# Patient Record
Sex: Male | Born: 1960 | Race: White | Hispanic: No | Marital: Single
Health system: Midwestern US, Community
[De-identification: ages and names within clinical notes are randomized; demographics above are authoritative.]

## PROBLEM LIST (undated history)

## (undated) DIAGNOSIS — I214 Non-ST elevation (NSTEMI) myocardial infarction: Principal | ICD-10-CM

## (undated) DIAGNOSIS — E785 Hyperlipidemia, unspecified: Secondary | ICD-10-CM

## (undated) DIAGNOSIS — E119 Type 2 diabetes mellitus without complications: Secondary | ICD-10-CM

## (undated) DIAGNOSIS — I10 Essential (primary) hypertension: Secondary | ICD-10-CM

## (undated) HISTORY — DX: Hyperlipidemia, unspecified: E78.5

## (undated) HISTORY — DX: Essential (primary) hypertension: I10

## (undated) HISTORY — DX: Type 2 diabetes mellitus without complications: E11.9

---

## 2009-06-18 ENCOUNTER — Encounter: Admission: RE | Admit: 2009-06-18 | Discharge: 2009-06-18 | Payer: Self-pay | Admitting: Family Medicine

## 2015-11-09 ENCOUNTER — Inpatient Hospital Stay
Admit: 2015-11-09 | Discharge: 2015-11-16 | Disposition: A | Discharge status: Home / Self Care | Payer: BLUE CROSS/BLUE SHIELD | Attending: Internal Medicine | Admitting: Internal Medicine

## 2015-11-09 ENCOUNTER — Emergency Department: Admit: 2015-11-09 | Payer: BLUE CROSS/BLUE SHIELD

## 2015-11-09 DIAGNOSIS — I214 Non-ST elevation (NSTEMI) myocardial infarction: Principal | ICD-10-CM

## 2015-11-09 LAB — URINALYSIS W/ RFLX MICROSCOPIC
Bilirubin: NEGATIVE
Glucose: NEGATIVE mg/dl
Ketone: NEGATIVE mg/dl
Leukocyte Esterase: NEGATIVE
Nitrites: NEGATIVE
Protein: 100 mg/dl — AB
Specific gravity: 1.025 (ref 1.005–1.030)
Urobilinogen: 0.2 mg/dl (ref 0.0–1.0)
pH (UA): 5.5 (ref 5.0–9.0)

## 2015-11-09 LAB — CBC WITH AUTOMATED DIFF
BASOPHILS: 0.4 % (ref 0–3)
EOSINOPHILS: 0.4 % (ref 0–5)
HCT: 48.2 % (ref 37.0–50.0)
HGB: 16.5 gm/dl (ref 12.4–17.2)
IMMATURE GRANULOCYTES: 0.4 % (ref 0.0–3.0)
LYMPHOCYTES: 11.7 % — ABNORMAL LOW (ref 28–48)
MCH: 30 pg (ref 23.0–34.6)
MCHC: 34.2 gm/dl (ref 30.0–36.0)
MCV: 87.6 fL (ref 80.0–98.0)
MONOCYTES: 8.4 % (ref 1–13)
MPV: 11 fL — ABNORMAL HIGH (ref 6.0–10.0)
NEUTROPHILS: 78.7 % — ABNORMAL HIGH (ref 34–64)
NRBC: 0 (ref 0–0)
PLATELET: 185 10*3/uL (ref 140–450)
RBC: 5.5 M/uL (ref 3.80–5.70)
RDW-SD: 43.9 (ref 35.1–43.9)
WBC: 11.2 10*3/uL — ABNORMAL HIGH (ref 4.0–11.0)

## 2015-11-09 LAB — METABOLIC PANEL, BASIC
BUN: 24 mg/dl (ref 7–25)
CO2: 24 mEq/L (ref 21–32)
Calcium: 9 mg/dl (ref 8.5–10.1)
Chloride: 104 mEq/L (ref 98–107)
Creatinine: 2.1 mg/dl — ABNORMAL HIGH (ref 0.6–1.3)
GFR est AA: 43
GFR est non-AA: 35
Glucose: 111 mg/dl — ABNORMAL HIGH (ref 74–106)
Potassium: 3.8 mEq/L (ref 3.5–5.1)
Sodium: 136 mEq/L (ref 136–145)

## 2015-11-09 LAB — POC URINE MICROSCOPIC

## 2015-11-09 LAB — TROPONIN I
Troponin-I: 3.4 ng/ml — CR (ref 0.000–0.045)
Troponin-I: 2.93 ng/ml — CR (ref 0.000–0.045)

## 2015-11-09 LAB — PROTHROMBIN TIME + INR
INR: 1 (ref 0.1–1.1)
Prothrombin time: 11.7 seconds (ref 10.2–12.9)

## 2015-11-09 LAB — PTT: aPTT: 31.1 seconds (ref 25.1–36.5)

## 2015-11-09 LAB — GLUCOSE, POC: Glucose (POC): 123 mg/dL — ABNORMAL HIGH (ref 65–105)

## 2015-11-09 MED ORDER — HEPARIN (PORCINE) 1,000 UNIT/ML IJ SOLN
1000 unit/mL | INTRAMUSCULAR | Status: DC
Start: 2015-11-09 — End: 2015-11-09

## 2015-11-09 MED ORDER — ONDANSETRON (PF) 4 MG/2 ML INJECTION
4 mg/2 mL | INTRAMUSCULAR | Status: DC | PRN
Start: 2015-11-09 — End: 2015-11-16

## 2015-11-09 MED ORDER — ACETAMINOPHEN (TYLENOL) SOLUTION 32MG/ML
ORAL | Status: DC | PRN
Start: 2015-11-09 — End: 2015-11-15

## 2015-11-09 MED ORDER — ATORVASTATIN 40 MG TAB
40 mg | Freq: Every evening | ORAL | Status: DC
Start: 2015-11-09 — End: 2015-11-16
  Administered 2015-11-10 – 2015-11-16 (×7): via ORAL

## 2015-11-09 MED ORDER — SODIUM CHLORIDE 0.9 % IV
INTRAVENOUS | Status: AC
Start: 2015-11-09 — End: 2015-11-10
  Administered 2015-11-09: 22:00:00 via INTRAVENOUS

## 2015-11-09 MED ORDER — MORPHINE 2 MG/ML INJECTION
2 mg/mL | INTRAMUSCULAR | Status: DC | PRN
Start: 2015-11-09 — End: 2015-11-16

## 2015-11-09 MED ORDER — LABETALOL 5 MG/ML IV SOLN
5 mg/mL | INTRAVENOUS | Status: AC
Start: 2015-11-09 — End: 2015-11-09
  Administered 2015-11-09: 21:00:00 via INTRAVENOUS

## 2015-11-09 MED ORDER — CLONIDINE 0.3 MG/24 HR WEEKLY TRANSDERM PATCH
0.3 mg/24 hr | TRANSDERMAL | Status: DC
Start: 2015-11-09 — End: 2015-11-16

## 2015-11-09 MED ORDER — HEPARIN (PORCINE) 1,000 UNIT/ML IJ SOLN
1000 unit/mL | INTRAMUSCULAR | Status: AC
Start: 2015-11-09 — End: 2015-11-09
  Administered 2015-11-09: 22:00:00 via INTRAVENOUS

## 2015-11-09 MED ORDER — HEPARIN (PORCINE) 1,000 UNIT/ML IJ SOLN
1000 unit/mL | INTRAMUSCULAR | Status: DC | PRN
Start: 2015-11-09 — End: 2015-11-15

## 2015-11-09 MED ORDER — HYDRALAZINE 50 MG TAB
50 mg | Freq: Three times a day (TID) | ORAL | Status: DC
Start: 2015-11-09 — End: 2015-11-16
  Administered 2015-11-10 – 2015-11-16 (×19): via ORAL

## 2015-11-09 MED ORDER — CARVEDILOL 6.25 MG TAB
6.25 mg | Freq: Once | ORAL | Status: AC
Start: 2015-11-09 — End: 2015-11-09
  Administered 2015-11-09: 23:00:00 via ORAL

## 2015-11-09 MED ORDER — NALOXONE 0.4 MG/ML INJECTION
0.4 mg/mL | INTRAMUSCULAR | Status: DC | PRN
Start: 2015-11-09 — End: 2015-11-16

## 2015-11-09 MED ORDER — CARVEDILOL 6.25 MG TAB
6.25 mg | Freq: Two times a day (BID) | ORAL | Status: DC
Start: 2015-11-09 — End: 2015-11-09

## 2015-11-09 MED ORDER — ASPIRIN 81 MG TAB, DELAYED RELEASE
81 mg | Freq: Every day | ORAL | Status: DC
Start: 2015-11-09 — End: 2015-11-09

## 2015-11-09 MED ORDER — HYDROCODONE-ACETAMINOPHEN 5 MG-325 MG TAB
5-325 mg | ORAL | Status: DC | PRN
Start: 2015-11-09 — End: 2015-11-16

## 2015-11-09 MED ORDER — HEPARIN (PORCINE) 1,000 UNIT/ML IJ SOLN
1000 unit/mL | INTRAMUSCULAR | Status: DC | PRN
Start: 2015-11-09 — End: 2015-11-15
  Administered 2015-11-14: 09:00:00 via INTRAVENOUS

## 2015-11-09 MED ORDER — CARVEDILOL 6.25 MG TAB
6.25 mg | Freq: Once | ORAL | Status: AC
Start: 2015-11-09 — End: 2015-11-09
  Administered 2015-11-09: 20:00:00 via ORAL

## 2015-11-09 MED ORDER — ACETAMINOPHEN 650 MG RECTAL SUPPOSITORY
650 mg | RECTAL | Status: DC | PRN
Start: 2015-11-09 — End: 2015-11-16

## 2015-11-09 MED ORDER — HYDRALAZINE 20 MG/ML IJ SOLN
20 mg/mL | Freq: Four times a day (QID) | INTRAMUSCULAR | Status: DC | PRN
Start: 2015-11-09 — End: 2015-11-16
  Administered 2015-11-09: 22:00:00 via INTRAVENOUS

## 2015-11-09 MED ORDER — NITROGLYCERIN 2 % TRANSDERMAL OINTMENT
2 % | TRANSDERMAL | Status: AC
Start: 2015-11-09 — End: 2015-11-09
  Administered 2015-11-09: 21:00:00 via TOPICAL

## 2015-11-09 MED ORDER — HEPARIN (PORCINE) 1,000 UNIT/ML IJ SOLN
1000 unit/mL | INTRAMUSCULAR | Status: DC | PRN
Start: 2015-11-09 — End: 2015-11-09

## 2015-11-09 MED ORDER — PNEUMOCOCCAL 13-VAL CONJ VACCINE-DIP CRM (PF) 0.5 ML IM SYRINGE
0.5 mL | INTRAMUSCULAR | Status: AC
Start: 2015-11-09 — End: 2015-11-16
  Administered 2015-11-16: 16:00:00 via INTRAMUSCULAR

## 2015-11-09 MED ORDER — ASPIRIN 81 MG CHEWABLE TAB
81 mg | ORAL | Status: AC
Start: 2015-11-09 — End: 2015-11-09
  Administered 2015-11-09: 21:00:00 via ORAL

## 2015-11-09 MED ORDER — ASPIRIN 81 MG TAB, DELAYED RELEASE
81 mg | Freq: Every day | ORAL | Status: DC
Start: 2015-11-09 — End: 2015-11-16
  Administered 2015-11-10 – 2015-11-16 (×7): via ORAL

## 2015-11-09 MED ORDER — HEPARIN (PORCINE)-0.45% NACL 25,000 UNIT/500 ML IV
25000 unit/500 mL | INTRAVENOUS | Status: DC
Start: 2015-11-09 — End: 2015-11-15
  Administered 2015-11-09 – 2015-11-15 (×22): via INTRAVENOUS

## 2015-11-09 MED ORDER — NITROGLYCERIN IN D5W 100 MG/250 ML (0.4 MG/ML) IV
100 mg/250 mL (400 mcg/mL) | INTRAVENOUS | Status: DC
Start: 2015-11-09 — End: 2015-11-10

## 2015-11-09 MED ORDER — CARVEDILOL 6.25 MG TAB
6.25 mg | Freq: Two times a day (BID) | ORAL | Status: DC
Start: 2015-11-09 — End: 2015-11-10
  Administered 2015-11-10: 12:00:00 via ORAL

## 2015-11-09 MED ORDER — AMLODIPINE 5 MG TAB
5 mg | Freq: Every day | ORAL | Status: DC
Start: 2015-11-09 — End: 2015-11-16
  Administered 2015-11-10 – 2015-11-16 (×7): via ORAL

## 2015-11-09 MED ORDER — ACETAMINOPHEN 325 MG TABLET
325 mg | ORAL | Status: DC | PRN
Start: 2015-11-09 — End: 2015-11-15

## 2015-11-09 MED FILL — SODIUM CHLORIDE 0.9 % IV: INTRAVENOUS | Qty: 1000

## 2015-11-09 MED FILL — HYDRALAZINE 20 MG/ML IJ SOLN: 20 mg/mL | INTRAMUSCULAR | Qty: 1

## 2015-11-09 MED FILL — NITRO-BID 2 % TRANSDERMAL OINTMENT: 2 % | TRANSDERMAL | Qty: 1

## 2015-11-09 MED FILL — HEPARIN (PORCINE) 1,000 UNIT/ML IJ SOLN: 1000 unit/mL | INTRAMUSCULAR | Qty: 10

## 2015-11-09 MED FILL — ASPIRIN 81 MG CHEWABLE TAB: 81 mg | ORAL | Qty: 4

## 2015-11-09 MED FILL — NITROGLYCERIN IN D5W 100 MG/250 ML (0.4 MG/ML) IV: 100 mg/250 mL (400 mcg/mL) | INTRAVENOUS | Qty: 250

## 2015-11-09 MED FILL — LABETALOL 5 MG/ML IV SOLN: 5 mg/mL | INTRAVENOUS | Qty: 20

## 2015-11-09 MED FILL — CARVEDILOL 6.25 MG TAB: 6.25 mg | ORAL | Qty: 1

## 2015-11-09 MED FILL — HEPARIN (PORCINE)-0.45% NACL 25,000 UNIT/500 ML IV: 25000 unit/500 mL | INTRAVENOUS | Qty: 500

## 2015-11-09 MED FILL — CLONIDINE 0.3 MG/24 HR WEEKLY TRANSDERM PATCH: 0.3 mg/24 hr | TRANSDERMAL | Qty: 1

## 2015-11-09 NOTE — Consults (Signed)
[facility]  CONSULTATION REPORT  NAME:  Gregory Soto, Gregory Soto  SEX:   M  ADMIT: 11/09/2015  DATE OF CONSULT: 11/09/2015  REFERRING PHYSICIAN:    DOB: 1960/08/12  MR#    [id number]  ROOM:  [id number]  ACCT#  [id number]    cc: [name]    REASON FOR CONSULTATION:  Positive troponin.    CHIEF COMPLAINT:  Left knee pain, high blood pressure, blood in urine noted today.    HISTORY OF PRESENT ILLNESS:  The patient is a 55 year old man that was visiting his mom and hurt his left knee.  He went to the [facility] doctor's office.  He had an x-ray of his left knee that showed a bone spur.  While he was there, they noted that he had an elevated blood pressure.  They did a urinalysis and checked lab work.  They gave him medication that was amlodipine to start for his blood pressure being elevated and he was sent home.  He took his first dose of amlodipine 10 mg prescribed once daily at 11:30 a.m. today.  He is not on any regular antihypertensive medications.  He does not take any regular prescription medications.  He does not have a primary care physician currently and has been looking for and is looking for a primary care physician.  He has not recently seen a primary care physician.  He states that when he got home, they called him as they noted blood in his urine.  He states that he has not seen any blood in his urine; this was microscopic.  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  He reported when he came to the emergency room, he has had no chest pain.  He has had no shortness of breath.  He states now that after the chest x-ray, he is having some slight left upper chest soreness.  He has had no history of cardiac disease with no prior myocardial infarction, no prior stent placement, no prior cardiac surgery.  He notes no known history of diabetes mellitus.  He notes no known history of kidney dysfunction; however, he is aware that his  kidney function was abnormal on the check today.  He has recently seen, he states, an eye doctor.  He has also seen and otologist as he states he has bad hearing.  He has seen a Education officer, community.  He lives in [place].  The patient states that he has been told he has hypertension before; however, he has not been on medication for this.  He is also told he had high cholesterol in the past; however, his cholesterol has not been recently checked and he has not been on prescription medication.    PAST MEDICAL HISTORY AND PAST SURGICAL HISTORY:  As noted above.      ALLERGIES:  PENICILLIN.    FAMILY HISTORY:  Sister having diabetes mellitus.    No known family history of heart disease.    HOME MEDICATIONS:  The patient is on no prescription medications.  He does take low-dose aspirin daily.    REVIEW OF SYSTEMS:  CONSTITUTIONAL:  No fevers or chills.  RESPIRATORY:  No shortness of breath.  CARDIOVASCULAR:  As noted in HPI.  GASTROINTESTINAL:  No abdominal pain, no recent heartburn symptoms.  GENITOURINARY:  No dysuria.  MUSCULOSKELETAL:  Left knee issues as noted in HPI.  NEUROLOGIC:  No unilateral weakness.  PSYCHIATRIC:  No depression.  HEMATOLOGIC:  No excessive bleeding.    SOCIAL HISTORY:  The patient is single.  There is no current or previous tobacco use.  The patient does note that his parents smoked.  The patient is from [place], [place].    PHYSICAL EXAMINATION:  VITAL SIGNS:  Blood pressure 227/108, heart rate 76, temperature 97.8, O2 saturation 98%, respiratory rate 18.  GENERAL:  No acute distress.  Well developed, well nourished.  HEENT:  Normocephalic, atraumatic.  Mucous membranes moist.  NECK:  Supple, no JVD.  CHEST:  Clear to auscultation bilaterally.  No crackles or rhonchi.  HEART:  Regular rhythm.  No murmurs, rubs, gallops.  GASTROINTESTINAL:  Abdomen is soft, nontender, nondistended, normoactive bowel sounds.  EXTREMITIES:  No clubbing, cyanosis, edema.  NEUROLOGIC:  Alert and oriented times 3,  no obvious focal deficits.  PSYCHIATRIC:  Normal mood and affect.  SKIN:  Warm, well perfused.    LABORATORY DATA:  WBC 11.2, hemoglobin 16.5, hematocrit 48.2, platelets 185.    Urinalysis 100 protein, small blood.    PT 11.7, INR 1, PTT 31.1.    Sodium 136, potassium 3.8, chloride 104, CO2 24, BUN 24, creatinine 2.1, glucose 111.    Troponin 3.4.    Chest x-ray impression (11/09/2015):  Lungs are clear, and the heart is mildly enlarged.  Pulmonary vasculature is within normal limits.    IMPRESSION:  1.  Positive troponin I of 3.4 in the setting of no shortness of breath and no chest pain prior to hospitalization.  The patient does now note some slight left upper chest soreness following having the chest x-ray performed.  2.  Last knee injury today, which led to the patient going [place] doctor's office when he was visiting his mother where he had an x-ray of the left knee which showed, he reports, a bone spur.  During this visit, they noted that his blood pressure was elevated and he had a urinalysis and labs drawn.  He states that after he left, he was called with the urinalysis results that showed blood in his urine.  He has not visualized blood in his urine.  He was also prescribed at the visit today Amlodipine 10 mg daily.  3.  No current primary care physician.  4.  Elevated creatinine of 2.1 with no known history of renal dysfunction.    PLAN:  1.  Discussed with patient in detail and provided cardiac education.  Review with patient labs available.  2.  Will follow serial cardiac markers to assess peak.  3.  Will monitor the patient closely on telemetry.  4.  Will order echocardiogram to evaluate overall cardiac structure and function.  5.  Will check a.m. fasting lipid panel.  6.  Will likely recommend cardiac catheterization for Monday or early week; however, will need to follow up renal function.    7.   The patient is being admitted by the hospitalist internal medicine service and will defer to the  hospitalist internal medicine service whether Nephrology consult is needed.    Thank you for allowing [place] to participate in the patient's care.      ___________________  Donell Beers MD  Dictated By:.   SB  D:11/09/2015 17:30:51  T: 11/10/2015 08:56:12  [id number]

## 2015-11-09 NOTE — H&P (Signed)
History and Physical        Patient: Gregory Soto               Sex: male          DOA: 11/09/2015         Date of Birth:  [birth year]      Age:  55 y.o.        LOS:  LOS: 0 days        HPI:     Gregory Soto is a 55 y.o. male who was in his use state of health until yesterday, he mowed his yard using a push lawnmower, this morning his left knee was hurting  He went to urgent care where his blood pressure was extremely high and he was sent to the emergency room. Patient denies any any other symptoms including chest pain shortness of breath or any headache.     Past Medical History:   Diagnosis Date   ??? Hypertension        History reviewed. No pertinent surgical history.    History reviewed. No pertinent family history.  Sister has diabetes   Social History     Social History   ??? Marital status: SINGLE     Spouse name: N/A   ??? Number of children: N/A   ??? Years of education: N/A     Social History Main Topics   ??? Smoking status: Never Smoker   ??? Smokeless tobacco: Never Used   ??? Alcohol use Yes   ??? Drug use: No   ??? Sexual activity: Not Asked     Other Topics Concern   ??? None     Social History Narrative   ??? None       Prior to Admission medications    Medication Sig Start Date End Date Taking? Authorizing Provider   amLODIPine (NORVASC) 10 mg tablet Take 10 mg by mouth daily.   Yes Phys Other, MD       Allergies   Allergen Reactions   ??? Pcn [Penicillins] Other (comments)       Subjective:   Left knee pain  Extremely high blood pressure   Abnormal labs   Review of Systems:   Review of all other system was negative   12+ review of system was done   Objective:     Visit Vitals   ??? BP (!) 227/108   ??? Pulse 76   ??? Temp 97.8 ??F (36.6 ??C)   ??? Resp 18   ??? Ht 5\' 9"  (1.753 m)   ??? Wt 113.4 kg (250 lb)   ??? SpO2 98%   ??? BMI 36.92 kg/m2       Physical Exam:     General appearance: A very pleasant middle-aged male well built in no acute distress   Head: Normocephalic, without obvious abnormality, atraumatic  Eyes: Conjunctiva Pink,    Neck: supple, trachea midline, No JVDComment no carotid bruit, no neck stiffness   Lungs: CTA bilaterally,   Heart: RRR, S1, S2 normal, no murmur,   Abdomen: soft, non-tender, bowel sounds present,   Extremities: 1+ edema, distal pulses palpable, left knee movements painful  Skin: warm and dry   Psych: Appropriate Mood and Affect  Neurologic: Awake alert  Lymphatics : No palpable lymph nodes in cervical or groin area   Intake and Output:  Current Shift:     Last three shifts:       Lab/Data Reviewed:  Recent Results (from  the past 48 hour(s))   CBC WITH AUTOMATED DIFF    Collection Time: 11/09/15  3:29 PM   Result Value Ref Range    WBC 11.2 (H) 4.0 - 11.0 1000/mm3    RBC 5.50 3.80 - 5.70 M/uL    HGB 16.5 12.4 - 17.2 gm/dl    HCT 49.8 26.4 - 15.8 %    MCV 87.6 80.0 - 98.0 fL    MCH 30.0 23.0 - 34.6 pg    MCHC 34.2 30.0 - 36.0 gm/dl    PLATELET 309 407 - 680 1000/mm3    MPV 11.0 (H) 6.0 - 10.0 fL    RDW-SD 43.9 35.1 - 43.9      NRBC 0 0 - 0      IMMATURE GRANULOCYTES 0.4 0.0 - 3.0 %    NEUTROPHILS 78.7 (H) 34 - 64 %    LYMPHOCYTES 11.7 (L) 28 - 48 %    MONOCYTES 8.4 1 - 13 %    EOSINOPHILS 0.4 0 - 5 %    BASOPHILS 0.4 0 - 3 %   METABOLIC PANEL, BASIC    Collection Time: 11/09/15  3:29 PM   Result Value Ref Range    Sodium 136 136 - 145 mEq/L    Potassium 3.8 3.5 - 5.1 mEq/L    Chloride 104 98 - 107 mEq/L    CO2 24 21 - 32 mEq/L    Glucose 111 (H) 74 - 106 mg/dl    BUN 24 7 - 25 mg/dl    Creatinine 2.1 (H) 0.6 - 1.3 mg/dl    GFR est AA 88.1      GFR est non-AA 35      Calcium 9.0 8.5 - 10.1 mg/dl   TROPONIN I    Collection Time: 11/09/15  3:29 PM   Result Value Ref Range    Troponin-I 3.400 (HH) 0.000 - 0.045 ng/ml   URINALYSIS W/ RFLX MICROSCOPIC    Collection Time: 11/09/15  3:44 PM   Result Value Ref Range    Color YELLOW YELLOW,STRAW      Appearance CLEAR CLEAR      Glucose NEGATIVE NEGATIVE,Negative mg/dl    Bilirubin NEGATIVE NEGATIVE,Negative      Ketone NEGATIVE NEGATIVE,Negative mg/dl     Specific gravity 1.031 1.005 - 1.030      Blood SMALL (A) NEGATIVE,Negative      pH (UA) 5.5 5.0 - 9.0      Protein 100 (A) NEGATIVE,Negative mg/dl    Urobilinogen 0.2 0.0 - 1.0 mg/dl    Nitrites NEGATIVE NEGATIVE,Negative      Leukocyte Esterase NEGATIVE NEGATIVE,Negative     POC URINE MICROSCOPIC    Collection Time: 11/09/15  3:44 PM   Result Value Ref Range    WBC OCCASIONAL /HPF    RBC OCCASIONAL /HPF    Hyaline cast OCCASIONAL /LPF   PROTHROMBIN TIME + INR    Collection Time: 11/09/15  5:01 PM   Result Value Ref Range    Prothrombin time 11.7 10.2 - 12.9 seconds    INR 1.0 0.1 - 1.1     PTT    Collection Time: 11/09/15  5:01 PM   Result Value Ref Range    aPTT 31.1 25.1 - 36.5 seconds       Imaging:  Xr Chest Sngl V    Result Date: 11/09/2015  Indication: Diffuse chest pain.     IMPRESSION: Portable AP upright view the chest exposed at 4:51 PM November 09, 2015 reveals  the lungs are clear and the heart is mildly enlarged. Pulmonary vasculature is within normal limits.       Medications Reviewed:  Current Facility-Administered Medications   Medication Dose Route Frequency   ??? heparin (porcine) 1,000 unit/mL injection 3,510 Units  40 Units/kg (Adjusted) IntraVENous PRN   ??? heparin (porcine) 25,000 units in 0.45% saline 500 ml infusion  0-36 Units/kg/hr (Adjusted) IntraVENous TITRATE   ??? heparin (porcine) 1,000 unit/mL injection 7,020 Units  80 Units/kg (Adjusted) IntraVENous PRN   ??? nitroglycerin (TRIDIL) 400 mcg/ml infusion  5-200 mcg/min IntraVENous CONTINUOUS   ??? [START ON 11/10/2015] aspirin delayed-release tablet 81 mg  81 mg Oral DAILY   ??? hydrALAZINE (APRESOLINE) 20 mg/mL injection 20 mg  20 mg IntraVENous Q6H PRN   ??? hydrALAZINE (APRESOLINE) tablet 100 mg  100 mg Oral TID   ??? carvedilol (COREG) tablet 12.5 mg  12.5 mg Oral BID WITH MEALS   ??? [START ON 11/10/2015] aspirin delayed-release tablet 81 mg  81 mg Oral DAILY   ??? atorvastatin (LIPITOR) tablet 80 mg  80 mg Oral QHS    ??? cloNIDine (CATAPRES) 0.3 mg/24 hr patch 1 Patch  1 Patch TransDERmal Q7D     Current Outpatient Prescriptions   Medication Sig   ??? amLODIPine (NORVASC) 10 mg tablet Take 10 mg by mouth daily.       Assessment:   1. Acute non-ST elevation MI  2. Hypertensive emergency  3. Deafness  4. Obesity  5. Acute kidney injury  6. Left knee sprain   Plan:   Patient is being admitted to PCU for acute non-ST elevation MI, patient be started on aspirin, beta blocker, hydralazine and Lipitor. For hypertensive emergency patient be started on nitroglycerin drip which will be titrated to keep systolic blood pressure less than 160. Patient be started on heparin drip,  We will avoid ACE inhibitor due to acute kidney injury, cardiology consult done, we'll order an echocardiogram, will check lipid and hemoglobin A1c in the morning, further management depending on patient's progress and recommendations from cardiology. If renal function improves patient will need cardiac catheterization on Monday.         Hazel Sams, MD  P# [phone]

## 2015-11-09 NOTE — ED Provider Notes (Addendum)
[facility]  Emergency Department Treatment Report    Patient: Gregory Soto Age: 55 y.o. Sex: male    Date of Birth: 1960-12-20 Admit Date: 11/09/2015 PCP: None   MRN: [id number]  CSN: [id number]     Room: ER14/ER14 Time Dictated: 3:17 PM      I hereby certify this patient for admission based upon medical necessity as ??  noted below:    Chief Complaint   Chief Complaint   Patient presents with   ??? Hypertension   ??? Abnormal Lab Results       History of Present Illness   55 y.o. male with history of hearing issues was told to come to ED by his doctor. Went to urgent care due to his L knee hurting after mowing the lawn. Had xrays and told it was a bone spur. During vitals, noticed to be HTN to 220s/110s. Gave a urine sample and told had blood in his urine. Patient denies chest pain, shortness of breath, nausea, vomiting.       Review of Systems     Constitutional:  No fevers, chills, or nightsweats  Eyes: No visual changes  ENT: No ear, nose, or throat pains  Heme/Lymph: No easy bleeding or bruising  Respiratory: No dyspnea or cough  Cardiovascular: No chest pains  Gastrointestinal: No diarrhea, or abdominal pain, tolerating PO  Genitourinary: No pain with urination or frequency  Musculoskeletal: No muscle aches, LT knee pain   Integumentary: No recent rashes  Neurological: No headaches    Past Medical/Surgical History     Past Medical History:   Diagnosis Date   ??? Hypertension      History reviewed. No pertinent surgical history.    Social History     Social History     Social History   ??? Marital status: SINGLE     Spouse name: N/A   ??? Number of children: N/A   ??? Years of education: N/A     Social History Main Topics   ??? Smoking status: Never Smoker   ??? Smokeless tobacco: Never Used   ??? Alcohol use Yes   ??? Drug use: No   ??? Sexual activity: Not Asked     Other Topics Concern   ??? None     Social History Narrative   ??? None       Family History   History reviewed. No pertinent family history.     Current Medications     Current Facility-Administered Medications   Medication Dose Route Frequency Provider Last Rate Last Dose   ??? heparin (porcine) 1,000 unit/mL injection 3,510 Units  40 Units/kg (Adjusted) IntraVENous PRN Smitty Cords, MD       ??? heparin (porcine) 25,000 units in 0.45% saline 500 ml infusion  0-36 Units/kg/hr (Adjusted) IntraVENous TITRATE Smitty Cords, MD       ??? heparin (porcine) 1,000 unit/mL injection 7,020 Units  80 Units/kg (Adjusted) IntraVENous PRN Smitty Cords, MD       ??? labetalol (NORMODYNE;TRANDATE) injection 10 mg  10 mg IntraVENous NOW Smitty Cords, MD       ??? nitroglycerin (TRIDIL) 400 mcg/ml infusion  5-200 mcg/min IntraVENous CONTINUOUS Smitty Cords, MD       ??? [START ON 11/10/2015] aspirin delayed-release tablet 81 mg  81 mg Oral DAILY Donell Beers, MD         Current Outpatient Prescriptions   Medication Sig Dispense Refill   ??? amLODIPine (NORVASC) 10  mg tablet Take 10 mg by mouth daily.       Allergies     Allergies   Allergen Reactions   ??? Pcn [Penicillins] Other (comments)     Physical Exam     TRIAGE VITALS:   ED Triage Vitals   Enc Vitals Group      BP 11/09/15 1449 222/126      Pulse (Heart Rate) 11/09/15 1449 87      Resp Rate 11/09/15 1449 18      Temp 11/09/15 1449 97.8 ??F (36.6 ??C)      Temp src --       O2 Sat (%) 11/09/15 1449 98 %      Weight 11/09/15 1429 250 lb      Height 11/09/15 1429 5\' 9"       Head Cir --       Peak Flow --       Pain Score --       Pain Loc --       Pain Edu? --       Excl. in GC? --        Constituational:  NAD  Head: Normocephalic, atraumatic  Eyes: Pupils equal and reactive bilaterally, EOMI  ENT:  Ears, nose appear normal. No pharyngeal erythema or exudates  Neck: Supple, no JVD  Cardiovascular:  RRR w/out murmurs  Respiratory:  CTAB  Abdomen: Soft and non-tender  Back: Appears normal, no CVA tenderness  Musculoskeletal:  Calves soft, no peripheral edema, LT in ace wrap   Skin: No rashes seen  Neuro:  AOx3  Psych: Normal affect     Impression and Management Plan   55 yo male with asymptomatic HTN    Labs and imaging studies will be utilized to narrow the differential diagnosis and evaluate the potential causes of the patients chief complaint, and final disposition will be based on the results of their workup as well as their response to symptomatic treatment.    Diagnostic Studies   Lab:   Recent Results (from the past 24 hour(s))   CBC WITH AUTOMATED DIFF    Collection Time: 11/09/15  3:29 PM   Result Value Ref Range    WBC 11.2 (H) 4.0 - 11.0 1000/mm3    RBC 5.50 3.80 - 5.70 M/uL    HGB 16.5 12.4 - 17.2 gm/dl    HCT 63.8 45.3 - 64.6 %    MCV 87.6 80.0 - 98.0 fL    MCH 30.0 23.0 - 34.6 pg    MCHC 34.2 30.0 - 36.0 gm/dl    PLATELET 803 212 - 248 1000/mm3    MPV 11.0 (H) 6.0 - 10.0 fL    RDW-SD 43.9 35.1 - 43.9      NRBC 0 0 - 0      IMMATURE GRANULOCYTES 0.4 0.0 - 3.0 %    NEUTROPHILS 78.7 (H) 34 - 64 %    LYMPHOCYTES 11.7 (L) 28 - 48 %    MONOCYTES 8.4 1 - 13 %    EOSINOPHILS 0.4 0 - 5 %    BASOPHILS 0.4 0 - 3 %   METABOLIC PANEL, BASIC    Collection Time: 11/09/15  3:29 PM   Result Value Ref Range    Sodium 136 136 - 145 mEq/L    Potassium 3.8 3.5 - 5.1 mEq/L    Chloride 104 98 - 107 mEq/L    CO2 24 21 - 32 mEq/L    Glucose 111 (H) 74 -  106 mg/dl    BUN 24 7 - 25 mg/dl    Creatinine 2.1 (H) 0.6 - 1.3 mg/dl    GFR est AA 16.1      GFR est non-AA 35      Calcium 9.0 8.5 - 10.1 mg/dl   TROPONIN I    Collection Time: 11/09/15  3:29 PM   Result Value Ref Range    Troponin-I 3.400 (HH) 0.000 - 0.045 ng/ml   URINALYSIS W/ RFLX MICROSCOPIC    Collection Time: 11/09/15  3:44 PM   Result Value Ref Range    Color YELLOW YELLOW,STRAW      Appearance CLEAR CLEAR      Glucose NEGATIVE NEGATIVE,Negative mg/dl    Bilirubin NEGATIVE NEGATIVE,Negative      Ketone NEGATIVE NEGATIVE,Negative mg/dl    Specific gravity 0.960 1.005 - 1.030      Blood SMALL (A) NEGATIVE,Negative      pH (UA) 5.5 5.0 - 9.0      Protein 100 (A) NEGATIVE,Negative mg/dl     Urobilinogen 0.2 0.0 - 1.0 mg/dl    Nitrites NEGATIVE NEGATIVE,Negative      Leukocyte Esterase NEGATIVE NEGATIVE,Negative     POC URINE MICROSCOPIC    Collection Time: 11/09/15  3:44 PM   Result Value Ref Range    WBC OCCASIONAL /HPF    RBC OCCASIONAL /HPF    Hyaline cast OCCASIONAL /LPF       Imaging:    XR CHEST SNGL V   Final Result   IMPRESSION: Portable AP upright view the chest exposed at 4:51 PM November 09, 2015   reveals the lungs are clear and the heart is mildly enlarged. Pulmonary   vasculature is within normal limits.           Xr Chest Sngl V    Result Date: 11/09/2015  Indication: Diffuse chest pain.     IMPRESSION: Portable AP upright view the chest exposed at 4:51 PM November 09, 2015 reveals the lungs are clear and the heart is mildly enlarged. Pulmonary vasculature is within normal limits.       EKG:  80, sinus rhythm, TWI V3-6, q waves II, aVF  ED Course   4:48 PM  Spoke with Dr. Gayla Doss with [facility] will see patient. Recommends heparin and [facility] admission.     Spoke with Dr. Tasia Catchings, will admit patient to PCCU, start heparin and nitro drip     Medications   heparin (porcine) 1,000 unit/mL injection 3,510 Units (not administered)   heparin (porcine) 25,000 units in 0.45% saline 500 ml infusion (not administered)   heparin (porcine) 1,000 unit/mL injection 7,020 Units (not administered)   labetalol (NORMODYNE;TRANDATE) injection 10 mg (not administered)   nitroglycerin (TRIDIL) 400 mcg/ml infusion (not administered)   aspirin delayed-release tablet 81 mg (not administered)   carvedilol (COREG) tablet 6.25 mg (6.25 mg Oral Given 11/09/15 1622)   nitroglycerin (NITROBID) 2 % ointment 1 Inch (1 Inch Topical Given 11/09/15 1643)   aspirin chewable tablet 324 mg (324 mg Oral Given 11/09/15 1643)       MOST RECENT VITALS   Visit Vitals   ??? BP (!) 227/108   ??? Pulse 76   ??? Temp 97.8 ??F (36.6 ??C)   ??? Resp 18   ??? Ht  (1.753 m)   ??? Wt 113.4 kg (250 lb)   ??? SpO2 98%   ??? BMI 36.92 kg/m2       Medical Decision Making    55 yo  male with no significant past medical history presented with asymptomatic HTN. Initial BP 222/126. Took 10 mg Norvasc earlier in the morning per urgent care. Given a dose of Coreg with out improvement. EKG showed TWI in V2-5. BMP showed Cr 2.1, concerning for AKI. Troponin elevated at 3.4. Due to the EKG and troponin, concern for NSTEMI. Started on heparin and consulted [facility]. They will see the patient. Due to his BP, gave a dose of labetalol and then started on nitro drip. During stay here patient had no chest pain or shortness of breath. Patient also with blood in urine, no signs of infection. Denies flank pain or pain with urination. Low suspicion for nephrolihisis. Hematuria may be secondary to HTN. Will need further work up as in patient. Discussed case with Dr. Tasia Catchings, will admit to PCCU.       Attending note by Dr. Rolm Gala Athalee Esterline:I,  Smitty Cords, MD, have personally seen and examined this patient; I have fully participated in the care of this patient with the resident.  I have reviewed and agree with all pertinent clinical information including history, physical exam, studies and the plan.  I have also reviewed and agree with the medications, allergies and past medical history sections for this patient. On my examination he is quite comfortable appearing. He said he cut the grass yesterday without any difficulty. He looks to be severely hypertensive on his readings here and we are concerned significant depressive a high troponin. Cardiology and internal medicine had been consulted for admission. Is placed on heparin, nitroglycerin and labetalol IV. Appears that his hypertension is causing him to have an non-ST elevation myocardial infarction. This could be life-threatening and without hypertension develop other noncardiac complications such as stroke. Placed in PCU.Critical care time excluding procedures, but including direct patient care, reviewing medical records,  evaluating results of diagnostic testing, discussions with family members, and consulting with physicians:45 minutes    Smitty Cords, MD  November 09, 2015        Final Diagnosis       ICD-10-CM ICD-9-CM   1. NSTEMI (non-ST elevated myocardial infarction) (HCC) I21.4 410.70   2. Secondary hypertension I15.9 405.99   3. AKI (acute kidney injury) (HCC) N17.9 584.9   4. Hematuria R31.9 599.70     Disposition   Admitted to PCCU    The patient was personally evaluated by myself and Dr. Arvella Merles who agrees with the above assessment and plan.      Tommas Olp, DO  November 09, 2015      My signature above authenticates this document and my orders, the final ??  diagnosis (es), discharge prescription (s), and instructions in the Epic ??  record.  If you have any questions please contact [phone].  ??  Nursing notes have been reviewed by the physician/ advanced practice ??  Clinician.  Dragon medical dictation software was used for portions of this report. Unintended voice recognition errors may occur.

## 2015-11-09 NOTE — ED Triage Notes (Signed)
Pt sent here from doctors office this morning, pt has elevated blood pressure and his creatinine was elevated this morning, pt was seen there for knee pain

## 2015-11-09 NOTE — Progress Notes (Signed)
Cardiology consult note dictated.  Dictation #: [id number]. D/w ED.  Starting heparin gtt and NTG gtt.  F/u BP closely.  F/u serial cardiac markers to assess peak.  F/u renal function.  Will likely plan cardiac catheterization on Monday or early week, depending on renal function.  Rest as noted in dictated note.  Appreciate consult.

## 2015-11-09 NOTE — ED Notes (Signed)
TRANSFER - OUT REPORT:    Verbal report given to [facility], RN(name) on Gregory Soto  being transferred to 4 ICU(unit) for routine progression of care       Report consisted of patient???s Situation, Background, Assessment and   Recommendations(SBAR).     Information from the following report(s) SBAR, ED Summary, Recent Results and Cardiac Rhythm NSR was reviewed with the receiving nurse.    Lines:   Peripheral IV 11/09/15 Right Forearm (Active)   Site Assessment Clean, dry, & intact 11/09/2015  3:31 PM   Phlebitis Assessment 0 11/09/2015  3:31 PM   Infiltration Assessment 0 11/09/2015  3:31 PM   Dressing Status Clean, dry, & intact 11/09/2015  3:31 PM   Dressing Type Transparent 11/09/2015  3:31 PM   Hub Color/Line Status Pink 11/09/2015  3:31 PM       Peripheral IV 11/09/15 Left Wrist (Active)   Site Assessment Clean, dry, & intact 11/09/2015  5:47 PM   Phlebitis Assessment 0 11/09/2015  5:47 PM   Infiltration Assessment 0 11/09/2015  5:47 PM   Dressing Status Clean, dry, & intact 11/09/2015  5:47 PM   Dressing Type Transparent 11/09/2015  5:47 PM   Hub Color/Line Status Pink 11/09/2015  5:47 PM        Opportunity for questions and clarification was provided.      Patient transported with:   Monitor  Registered Nurse

## 2015-11-10 LAB — EKG 12-LEAD
Atrial Rate: 80 {beats}/min
P Axis: 35 degrees
P-R Interval: 152 ms
Q-T Interval: 428 ms
QRS Duration: 106 ms
QTc Calculation (Bazett): 493 ms
R Axis: 20 degrees
T Axis: -65 degrees
Ventricular Rate: 80 {beats}/min

## 2015-11-10 LAB — CBC WITH AUTOMATED DIFF
BASOPHILS: 0.3 % (ref 0–3)
BASOPHILS: 0.4 % (ref 0–3)
EOSINOPHILS: 0.5 % (ref 0–5)
EOSINOPHILS: 0.6 % (ref 0–5)
HCT: 44.4 % (ref 37.0–50.0)
HCT: 41.3 % (ref 37.0–50.0)
HGB: 15.3 gm/dl (ref 12.4–17.2)
HGB: 14.3 gm/dl (ref 12.4–17.2)
IMMATURE GRANULOCYTES: 0.3 % (ref 0.0–3.0)
IMMATURE GRANULOCYTES: 0.3 % (ref 0.0–3.0)
LYMPHOCYTES: 15.6 % — ABNORMAL LOW (ref 28–48)
LYMPHOCYTES: 18.6 % — ABNORMAL LOW (ref 28–48)
MCH: 30.3 pg (ref 23.0–34.6)
MCH: 30.5 pg (ref 23.0–34.6)
MCHC: 34.5 gm/dl (ref 30.0–36.0)
MCHC: 34.6 gm/dl (ref 30.0–36.0)
MCV: 87.9 fL (ref 80.0–98.0)
MCV: 88.1 fL (ref 80.0–98.0)
MONOCYTES: 9.5 % (ref 1–13)
MONOCYTES: 10.6 % (ref 1–13)
MPV: 10.7 fL — ABNORMAL HIGH (ref 6.0–10.0)
MPV: 11.3 fL — ABNORMAL HIGH (ref 6.0–10.0)
NEUTROPHILS: 73.8 % — ABNORMAL HIGH (ref 34–64)
NEUTROPHILS: 69.5 % — ABNORMAL HIGH (ref 34–64)
NRBC: 0 (ref 0–0)
NRBC: 0 (ref 0–0)
PLATELET: 213 10*3/uL (ref 140–450)
PLATELET: 200 10*3/uL (ref 140–450)
RBC: 5.05 M/uL (ref 3.80–5.70)
RBC: 4.69 M/uL (ref 3.80–5.70)
RDW-SD: 43.9 (ref 35.1–43.9)
RDW-SD: 44.2 — ABNORMAL HIGH (ref 35.1–43.9)
WBC: 13.2 10*3/uL — ABNORMAL HIGH (ref 4.0–11.0)
WBC: 10.1 10*3/uL (ref 4.0–11.0)

## 2015-11-10 LAB — METABOLIC PANEL, COMPREHENSIVE
ALT (SGPT): 27 U/L (ref 12–78)
AST (SGOT): 24 U/L (ref 15–37)
Albumin: 3.2 gm/dl — ABNORMAL LOW (ref 3.4–5.0)
Alk. phosphatase: 68 U/L (ref 45–117)
BUN: 27 mg/dl — ABNORMAL HIGH (ref 7–25)
Bilirubin, total: 0.7 mg/dl (ref 0.2–1.0)
CO2: 23 mEq/L (ref 21–32)
Calcium: 8.5 mg/dl (ref 8.5–10.1)
Chloride: 105 mEq/L (ref 98–107)
Creatinine: 1.7 mg/dl — ABNORMAL HIGH (ref 0.6–1.3)
GFR est AA: 55
GFR est non-AA: 45
Glucose: 105 mg/dl (ref 74–106)
Potassium: 3.7 mEq/L (ref 3.5–5.1)
Protein, total: 6.3 gm/dl — ABNORMAL LOW (ref 6.4–8.2)
Sodium: 136 mEq/L (ref 136–145)

## 2015-11-10 LAB — TROPONIN I
Troponin-I: 2.77 ng/ml — CR (ref 0.000–0.045)
Troponin-I: 2.4 ng/ml — CR (ref 0.000–0.045)
Troponin-I: 2.4 ng/ml — CR (ref 0.000–0.045)
Troponin-I: 2.42 ng/ml — CR (ref 0.000–0.045)

## 2015-11-10 LAB — HEMOGLOBIN A1C W/O EAG: Hemoglobin A1c: 6.3 % — ABNORMAL HIGH (ref 4.8–6.0)

## 2015-11-10 LAB — LIPID PANEL
CHOL/HDL Ratio: 5.3 Ratio — ABNORMAL HIGH (ref 0.0–5.0)
Cholesterol, total: 179 mg/dl (ref 140–199)
HDL Cholesterol: 34 mg/dl — ABNORMAL LOW (ref 40–96)
LDL, calculated: 117 mg/dl (ref 0–130)
Triglyceride: 139 mg/dl (ref 29–150)

## 2015-11-10 LAB — CK
CK: 141 U/L (ref 39–308)
CK: 127 U/L (ref 39–308)

## 2015-11-10 LAB — PTT, CRRT PROTOCOL (PTT DRIP)
PTT drip: 185.2 seconds — CR (ref 70.0–105.0)
PTT drip: 88.1 seconds (ref 70.0–105.0)
PTT drip: 84.8 seconds (ref 70.0–105.0)

## 2015-11-10 LAB — TSH 3RD GENERATION: TSH: 2.84 u[IU]/mL (ref 0.358–3.740)

## 2015-11-10 LAB — EKG, 12 LEAD, INITIAL
Atrial Rate: 80 {beats}/min
Calculated P Axis: 35 degrees
Calculated R Axis: 20 degrees
Calculated T Axis: -65 degrees
P-R Interval: 152 ms
Q-T Interval: 428 ms
QRS Duration: 106 ms
QTC Calculation (Bezet): 493 ms
Ventricular Rate: 80 {beats}/min

## 2015-11-10 LAB — CK-MB,QT
CK - MB: 9.3 ng/ml — CR (ref 0.0–3.6)
CK - MB: 10.2 ng/ml — CR (ref 0.0–3.6)

## 2015-11-10 LAB — T4, FREE: Free T4: 1.01 ng/dl (ref 0.76–1.46)

## 2015-11-10 LAB — MAGNESIUM: Magnesium: 2 mg/dl (ref 1.6–2.6)

## 2015-11-10 LAB — PHOSPHORUS: Phosphorus: 3.5 mg/dl (ref 2.5–4.9)

## 2015-11-10 LAB — VITAMIN B12: Vitamin B12: 195 pg/ml (ref 193–986)

## 2015-11-10 LAB — BILIRUBIN, DIRECT: Bilirubin, direct: 0.1 mg/dl (ref 0.0–0.2)

## 2015-11-10 LAB — VITAMIN D, 25 HYDROXY: Vitamin D, 25-OH, Total: 21 ng/ml — ABNORMAL LOW (ref 30.0–100.0)

## 2015-11-10 MED ORDER — CYANOCOBALAMIN 1,000 MCG/ML IJ SOLN
1000 mcg/mL | Freq: Every day | INTRAMUSCULAR | Status: DC
Start: 2015-11-10 — End: 2015-11-16
  Administered 2015-11-10 – 2015-11-16 (×7): via INTRAMUSCULAR

## 2015-11-10 MED ORDER — ISOSORBIDE MONONITRATE SR 60 MG 24 HR TAB
60 mg | Freq: Every day | ORAL | Status: DC
Start: 2015-11-10 — End: 2015-11-16
  Administered 2015-11-10 – 2015-11-16 (×7): via ORAL

## 2015-11-10 MED ORDER — CARVEDILOL 25 MG TAB
25 mg | Freq: Two times a day (BID) | ORAL | Status: DC
Start: 2015-11-10 — End: 2015-11-16
  Administered 2015-11-10 – 2015-11-16 (×12): via ORAL

## 2015-11-10 MED ORDER — ERGOCALCIFEROL (VITAMIN D2) 50,000 UNIT CAP
1250 mcg (50,000 unit) | ORAL | Status: DC
Start: 2015-11-10 — End: 2015-11-16
  Administered 2015-11-10: 12:00:00 via ORAL

## 2015-11-10 MED FILL — HYDRALAZINE 50 MG TAB: 50 mg | ORAL | Qty: 2

## 2015-11-10 MED FILL — ASPIRIN 81 MG TAB, DELAYED RELEASE: 81 mg | ORAL | Qty: 1

## 2015-11-10 MED FILL — AMLODIPINE 5 MG TAB: 5 mg | ORAL | Qty: 2

## 2015-11-10 MED FILL — CYANOCOBALAMIN 1,000 MCG/ML IJ SOLN: 1000 mcg/mL | INTRAMUSCULAR | Qty: 1

## 2015-11-10 MED FILL — HEPARIN (PORCINE)-0.45% NACL 25,000 UNIT/500 ML IV: 25000 unit/500 mL | INTRAVENOUS | Qty: 500

## 2015-11-10 MED FILL — VITAMIN D2 1,250 MCG (50,000 UNIT) CAPSULE: 1250 mcg (50,000 unit) | ORAL | Qty: 1

## 2015-11-10 MED FILL — CARVEDILOL 25 MG TAB: 25 mg | ORAL | Qty: 1

## 2015-11-10 MED FILL — CARVEDILOL 6.25 MG TAB: 6.25 mg | ORAL | Qty: 2

## 2015-11-10 MED FILL — LIPITOR 40 MG TABLET: 40 mg | ORAL | Qty: 2

## 2015-11-10 MED FILL — ISOSORBIDE MONONITRATE SR 60 MG 24 HR TAB: 60 mg | ORAL | Qty: 1

## 2015-11-10 NOTE — Other (Signed)
TRANSFER - OUT REPORT:    Verbal report given to Jennifer(name) on Gregory Soto  being transferred to 2202(unit) for routine progression of care       Report consisted of patient???s Situation, Background, Assessment and   Recommendations(SBAR).     Information from the following report(s) SBAR, Kardex, ED Summary, Intake/Output, MAR, Recent Results, Med Rec Status and Cardiac Rhythm NSR, PAC,BBB was reviewed with the receiving nurse.    Lines:   Peripheral IV 11/09/15 Right Forearm (Active)   Site Assessment Clean, dry, & intact 11/10/2015  8:18 AM   Phlebitis Assessment 0 11/10/2015  8:18 AM   Infiltration Assessment 0 11/10/2015  8:18 AM   Dressing Status Occlusive 11/10/2015  8:18 AM   Dressing Type Transparent 11/10/2015  8:18 AM   Hub Color/Line Status Pink;Infusing 11/10/2015  8:18 AM   Action Taken Open ports on tubing capped 11/10/2015  8:18 AM   Alcohol Cap Used Yes 11/10/2015  8:18 AM       Peripheral IV 11/09/15 Left Wrist (Active)   Site Assessment Clean, dry, & intact 11/10/2015  8:18 AM   Phlebitis Assessment 0 11/10/2015  8:18 AM   Infiltration Assessment 0 11/10/2015  8:18 AM   Dressing Status Occlusive 11/10/2015  8:18 AM   Dressing Type Transparent 11/10/2015  8:18 AM   Hub Color/Line Status Pink;End cap changed;Flushed 11/10/2015  8:18 AM   Action Taken Open ports on tubing capped 11/10/2015  8:18 AM   Alcohol Cap Used Yes 11/10/2015  8:18 AM        Opportunity for questions and clarification was provided.      Patient transported with:   [employer]

## 2015-11-10 NOTE — Progress Notes (Signed)
[facility] ([facility]   CARDIOLOGY PROGRESS NOTE  RECS:  ?? Cont medical therapy for HTN and NSTEMI.  BP better controlled.  ?? Plan for cath on Monday if creatinine continues to improve.  ?? Cont IV Heparin AC.    ASSESSMENT:  Gregory Soto is a 55 y.o. male  with   1.  NSTEMI  2.  HTN - untreated previously  3. HLD  4. AKI - creat 2.1 on presentation (unknown baseline)    Active Problems:    NSTEMI (non-ST elevated myocardial infarction) (HCC) (11/09/2015)        SUBJECTIVE:  No CP or SOB.  Feels well.    VS:   Visit Vitals   ??? BP 142/81   ??? Pulse 70   ??? Temp 97.4 ??F (36.3 ??C)   ??? Resp 18   ??? Ht 5\' 9"  (1.753 m)   ??? Wt 113.4 kg (250 lb)   ??? SpO2 99%   ??? BMI 36.92 kg/m2     Last 3 Recorded Weights in this Encounter    11/09/15 1429 11/09/15 1449   Weight: 113.4 kg (250 lb) 113.4 kg (250 lb)      Body mass index is 36.92 kg/(m^2).     Intake/Output Summary (Last 24 hours) at 11/10/15 1623  Last data filed at 11/10/15 0818   Gross per 24 hour   Intake          1347.25 ml   Output              250 ml   Net          1097.25 ml     TELE personally reviewed by me: NSR    EXAM:  General:  Skin warm, perfusion adequate  Eyes: EOMI, No icterus  Neck: Supple, JVD is absent  Lungs: clear, no rales, no rhonchi   Cardiac:  Regular Rhythm, No murmur, Gallops or Rubs  Abdomen: soft, nl bowel sounds  Ext: Edema is absent, pulses 2+  Neuro: Alert and oriented; Non-focal  Psych: Normal mood and affect    Labs:  Basic Metabolic Profile   Lab Results   Component Value Date/Time    NA 136 11/10/2015 03:36 AM    NA 136 11/09/2015 03:29 PM    K 3.7 11/10/2015 03:36 AM    K 3.8 11/09/2015 03:29 PM    CL 105 11/10/2015 03:36 AM    CL 104 11/09/2015 03:29 PM    CO2 23 11/10/2015 03:36 AM    CO2 24 11/09/2015 03:29 PM    BUN 27 (H) 11/10/2015 03:36 AM    BUN 24 11/09/2015 03:29 PM    CREA 1.7 (H) 11/10/2015 03:36 AM    CREA 2.1 (H) 11/09/2015 03:29 PM    GLU 105 11/10/2015 03:36 AM    GLU 111 (H) 11/09/2015 03:29 PM     CA 8.5 11/10/2015 03:36 AM    CA 9.0 11/09/2015 03:29 PM    MG 2.0 11/10/2015 03:36 AM    PHOS 3.5 11/10/2015 03:36 AM        CBC w/Diff    Lab Results   Component Value Date/Time    WBC 10.1 11/10/2015 03:36 AM    WBC 13.2 (H) 11/09/2015 11:54 PM    WBC 11.2 (H) 11/09/2015 03:29 PM    RBC 4.69 11/10/2015 03:36 AM    RBC 5.05 11/09/2015 11:54 PM    RBC 5.50 11/09/2015 03:29 PM    HGB 14.3 11/10/2015 03:36 AM  HGB 15.3 11/09/2015 11:54 PM    HGB 16.5 11/09/2015 03:29 PM    HCT 41.3 11/10/2015 03:36 AM    HCT 44.4 11/09/2015 11:54 PM    HCT 48.2 11/09/2015 03:29 PM    MCV 88.1 11/10/2015 03:36 AM    MCV 87.9 11/09/2015 11:54 PM    MCV 87.6 11/09/2015 03:29 PM    MCH 30.5 11/10/2015 03:36 AM    MCH 30.3 11/09/2015 11:54 PM    MCH 30.0 11/09/2015 03:29 PM    MCHC 34.6 11/10/2015 03:36 AM    MCHC 34.5 11/09/2015 11:54 PM    MCHC 34.2 11/09/2015 03:29 PM    PLT 200 11/10/2015 03:36 AM    PLT 213 11/09/2015 11:54 PM    PLT 185 11/09/2015 03:29 PM    Lab Results   Component Value Date/Time    GRANS 69.5 (H) 11/10/2015 03:36 AM    GRANS 73.8 (H) 11/09/2015 11:54 PM    GRANS 78.7 (H) 11/09/2015 03:29 PM    LYMPH 18.6 (L) 11/10/2015 03:36 AM    LYMPH 15.6 (L) 11/09/2015 11:54 PM    LYMPH 11.7 (L) 11/09/2015 03:29 PM    MONOS 10.6 11/10/2015 03:36 AM    MONOS 9.5 11/09/2015 11:54 PM    MONOS 8.4 11/09/2015 03:29 PM    EOS 0.6 11/10/2015 03:36 AM    EOS 0.5 11/09/2015 11:54 PM    EOS 0.4 11/09/2015 03:29 PM    BASOS 0.4 11/10/2015 03:36 AM    BASOS 0.3 11/09/2015 11:54 PM    BASOS 0.4 11/09/2015 03:29 PM        Cardiac Enzymes   Lab Results   Component Value Date/Time    CPK 127 11/10/2015 03:36 AM    CPK 141 11/09/2015 10:07 PM    CKMB 9.3 (HH) 11/10/2015 03:36 AM    CKMB 10.2 (HH) 11/09/2015 10:07 PM        Coagulation   Lab Results   Component Value Date/Time    PTP 11.7 11/09/2015 05:01 PM    INR 1.0 11/09/2015 05:01 PM    APTT 31.1 11/09/2015 05:01 PM        Medications:    Current Facility-Administered Medications    Medication Dose Route Frequency   ??? cyanocobalamin (VITAMIN B12) injection 1,000 mcg  1,000 mcg IntraMUSCular DAILY   ??? ergocalciferol (ERGOCALCIFEROL) capsule 50,000 Units  50,000 Units Oral Q7D   ??? isosorbide mononitrate ER (IMDUR) tablet 60 mg  60 mg Oral DAILY   ??? carvedilol (COREG) tablet 25 mg  25 mg Oral BID WITH MEALS   ??? heparin (porcine) 25,000 units in 0.45% saline 500 ml infusion  0-36 Units/kg/hr (Adjusted) IntraVENous TITRATE   ??? amLODIPine (NORVASC) tablet 10 mg  10 mg Oral DAILY   ??? hydrALAZINE (APRESOLINE) tablet 100 mg  100 mg Oral TID   ??? aspirin delayed-release tablet 81 mg  81 mg Oral DAILY   ??? atorvastatin (LIPITOR) tablet 80 mg  80 mg Oral QHS   ??? cloNIDine (CATAPRES) 0.3 mg/24 hr patch 1 Patch  1 Patch TransDERmal Q7D   ??? pneumococcal 13 val conj dip (PREVNAR-13) injection 0.5 mL  0.5 mL IntraMUSCular PRIOR TO DISCHARGE          Rolin Barry, DO  [facility])  [phone]    November 10, 2015  4:23 PM

## 2015-11-10 NOTE — Progress Notes (Signed)
Progress Note    Patient: Gregory Soto   Age:  55 y.o.  DOA: 11/09/2015   Admit Dx / CC: NSTEMI (non-ST elevated myocardial infarction) ([place])  NSTEMI (non-ST elevated myocardial infarction) ([place])  LOS:  LOS: 1 day     Active Problems   Active Problems:    NSTEMI (non-ST elevated myocardial infarction) ([place]) (11/09/2015)      Additional Assessments   1. Acute non-ST elevation MI  2. Hypertensive emergency  3. Deafness  4. Obesity  5. Acute kidney injury  6. Left knee sprain   7. Prediabetes/metabolic syndrome  8. Vitamin D deficiency  9. B12 deficiency  Plan:   Patient be continued on current therapy, patient's antihypertensive medications were adjusted,  We'll discontinue nitroglycerin drip patient be started on Imdur 60 mg daily, will replace vitamin D and B12, patient be transferred to regular floor we'll repeat basic metabolic profile on Monday if creatinine is normal  Patient can undergo cardiac catheterization, patient was discussed with cardiology.  Subjective:   Feeling little better no new complaints today    Objective:     Visit Vitals   ??? BP 142/81   ??? Pulse 70   ??? Temp 97.4 ??F (36.3 ??C)   ??? Resp 18   ??? Ht 5' 9" (1.753 m)   ??? Wt 113.4 kg (250 lb)   ??? SpO2 99%   ??? BMI 36.92 kg/m2       Physical Exam:  General appearance: Middle-age male well built in no acute distress  Head: Normocephalic, without obvious abnormality, atraumatic  Neck: supple, no JVD, no bruit, no thyromegaly  Lungs: CTA bilaterally  Heart: RRR, S1, S2 normal, no murmur,  Abdomen: soft, non-tender. Bowel sounds Audible,   Extremities: Trace edema  Skin: Skin color, texture, turgor normal.   Neurologic: Awake and alert    Intake and Output:  Current Shift:  07/29 0701 - 07/29 1900  In: 297.4 [P.O.:240; I.V.:57.4]  Out: -   Last three shifts:  07/27 1901 - 07/29 0700  In: 1049.8 [P.O.:240; I.V.:809.8]  Out: 250 [Urine:250]    Lab/Data Reviewed:  Recent Results (from the past 48 hour(s))   CBC WITH AUTOMATED DIFF     Collection Time: 11/09/15  3:29 PM   Result Value Ref Range    WBC 11.2 (H) 4.0 - 11.0 1000/mm3    RBC 5.50 3.80 - 5.70 M/uL    HGB 16.5 12.4 - 17.2 gm/dl    HCT 48.2 37.0 - 50.0 %    MCV 87.6 80.0 - 98.0 fL    MCH 30.0 23.0 - 34.6 pg    MCHC 34.2 30.0 - 36.0 gm/dl    PLATELET 185 140 - 450 1000/mm3    MPV 11.0 (H) 6.0 - 10.0 fL    RDW-SD 43.9 35.1 - 43.9      NRBC 0 0 - 0      IMMATURE GRANULOCYTES 0.4 0.0 - 3.0 %    NEUTROPHILS 78.7 (H) 34 - 64 %    LYMPHOCYTES 11.7 (L) 28 - 48 %    MONOCYTES 8.4 1 - 13 %    EOSINOPHILS 0.4 0 - 5 %    BASOPHILS 0.4 0 - 3 %   METABOLIC PANEL, BASIC    Collection Time: 11/09/15  3:29 PM   Result Value Ref Range    Sodium 136 136 - 145 mEq/L    Potassium 3.8 3.5 - 5.1 mEq/L    Chloride 104 98 - 107  mEq/L    CO2 24 21 - 32 mEq/L    Glucose 111 (H) 74 - 106 mg/dl    BUN 24 7 - 25 mg/dl    Creatinine 2.1 (H) 0.6 - 1.3 mg/dl    GFR est AA 43.0      GFR est non-AA 35      Calcium 9.0 8.5 - 10.1 mg/dl   TROPONIN I    Collection Time: 11/09/15  3:29 PM   Result Value Ref Range    Troponin-I 3.400 (HH) 0.000 - 0.045 ng/ml   EKG, 12 LEAD, INITIAL    Collection Time: 11/09/15  3:35 PM   Result Value Ref Range    Ventricular Rate 80 BPM    Atrial Rate 80 BPM    P-R Interval 152 ms    QRS Duration 106 ms    Q-T Interval 428 ms    QTC Calculation (Bezet) 493 ms    Calculated P Axis 35 degrees    Calculated R Axis 20 degrees    Calculated T Axis -65 degrees    Diagnosis       Sinus rhythm with premature atrial complexes  Possible Left atrial enlargement  ST & T wave abnormality, consider inferolateral ischemia  Prolonged QT  Abnormal ECG  When compared with ECG of 07-Apr-1997 22:10,  Anterolateral q waves now present.    Confirmed by Haze Boyden, MD, Broadus Ahamed ([id number]) on 11/10/2015 9:48:53 AM     URINALYSIS W/ RFLX MICROSCOPIC    Collection Time: 11/09/15  3:44 PM   Result Value Ref Range    Color YELLOW YELLOW,STRAW      Appearance CLEAR CLEAR      Glucose NEGATIVE NEGATIVE,Negative mg/dl     Bilirubin NEGATIVE NEGATIVE,Negative      Ketone NEGATIVE NEGATIVE,Negative mg/dl    Specific gravity 1.025 1.005 - 1.030      Blood SMALL (A) NEGATIVE,Negative      pH (UA) 5.5 5.0 - 9.0      Protein 100 (A) NEGATIVE,Negative mg/dl    Urobilinogen 0.2 0.0 - 1.0 mg/dl    Nitrites NEGATIVE NEGATIVE,Negative      Leukocyte Esterase NEGATIVE NEGATIVE,Negative     POC URINE MICROSCOPIC    Collection Time: 11/09/15  3:44 PM   Result Value Ref Range    WBC OCCASIONAL /HPF    RBC OCCASIONAL /HPF    Hyaline cast OCCASIONAL /LPF   PROTHROMBIN TIME + INR    Collection Time: 11/09/15  5:01 PM   Result Value Ref Range    Prothrombin time 11.7 10.2 - 12.9 seconds    INR 1.0 0.1 - 1.1     PTT    Collection Time: 11/09/15  5:01 PM   Result Value Ref Range    aPTT 31.1 25.1 - 36.5 seconds   GLUCOSE, POC    Collection Time: 11/09/15  6:11 PM   Result Value Ref Range    Glucose (POC) 123 (H) 65 - 105 mg/dL   TROPONIN I    Collection Time: 11/09/15  6:27 PM   Result Value Ref Range    Troponin-I 2.930 (HH) 0.000 - 0.045 ng/ml   CK    Collection Time: 11/09/15 10:07 PM   Result Value Ref Range    CK 141 39 - 308 U/L   CK-MB,QT    Collection Time: 11/09/15 10:07 PM   Result Value Ref Range    CK - MB 10.2 (HH) 0.0 - 3.6 ng/ml   TROPONIN I  Collection Time: 11/09/15 10:07 PM   Result Value Ref Range    Troponin-I 2.420 (HH) 0.000 - 0.045 ng/ml   CBC WITH AUTOMATED DIFF    Collection Time: 11/09/15 11:54 PM   Result Value Ref Range    WBC 13.2 (H) 4.0 - 11.0 1000/mm3    RBC 5.05 3.80 - 5.70 M/uL    HGB 15.3 12.4 - 17.2 gm/dl    HCT 44.4 37.0 - 50.0 %    MCV 87.9 80.0 - 98.0 fL    MCH 30.3 23.0 - 34.6 pg    MCHC 34.5 30.0 - 36.0 gm/dl    PLATELET 213 140 - 450 1000/mm3    MPV 10.7 (H) 6.0 - 10.0 fL    RDW-SD 43.9 35.1 - 43.9      NRBC 0 0 - 0      IMMATURE GRANULOCYTES 0.3 0.0 - 3.0 %    NEUTROPHILS 73.8 (H) 34 - 64 %    LYMPHOCYTES 15.6 (L) 28 - 48 %    MONOCYTES 9.5 1 - 13 %    EOSINOPHILS 0.5 0 - 5 %    BASOPHILS 0.3 0 - 3 %    TROPONIN I    Collection Time: 11/09/15 11:54 PM   Result Value Ref Range    Troponin-I 2.770 (HH) 0.000 - 0.045 ng/ml   PTT, CRRT PROTOCOL (PTT DRIP)    Collection Time: 11/09/15 11:54 PM   Result Value Ref Range    PTT drip 185.2 (HH) 70.0 - 105.0 seconds   LIPID PANEL    Collection Time: 11/10/15  3:36 AM   Result Value Ref Range    Cholesterol, total 179 140 - 199 mg/dl    HDL Cholesterol 34 (L) 40 - 96 mg/dl    Triglyceride 139 29 - 150 mg/dl    LDL, calculated 117 0 - 130 mg/dl    CHOL/HDL Ratio 5.3 (H) 0.0 - 5.0 Ratio   TSH 3RD GENERATION    Collection Time: 11/10/15  3:36 AM   Result Value Ref Range    TSH 2.840 0.358 - 3.740 uIU/mL   T4, FREE    Collection Time: 11/10/15  3:36 AM   Result Value Ref Range    Free T4 1.01 0.76 - 1.46 ng/dl   VITAMIN B12    Collection Time: 11/10/15  3:36 AM   Result Value Ref Range    Vitamin B12 195 193 - 986 pg/ml   VITAMIN D, 25 HYDROXY    Collection Time: 11/10/15  3:36 AM   Result Value Ref Range    Vitamin D, 25-OH, Total 21.0 (L) 30.0 - 100.0 ng/ml   CBC WITH AUTOMATED DIFF    Collection Time: 11/10/15  3:36 AM   Result Value Ref Range    WBC 10.1 4.0 - 11.0 1000/mm3    RBC 4.69 3.80 - 5.70 M/uL    HGB 14.3 12.4 - 17.2 gm/dl    HCT 41.3 37.0 - 50.0 %    MCV 88.1 80.0 - 98.0 fL    MCH 30.5 23.0 - 34.6 pg    MCHC 34.6 30.0 - 36.0 gm/dl    PLATELET 200 140 - 450 1000/mm3    MPV 11.3 (H) 6.0 - 10.0 fL    RDW-SD 44.2 (H) 35.1 - 43.9      NRBC 0 0 - 0      IMMATURE GRANULOCYTES 0.3 0.0 - 3.0 %    NEUTROPHILS 69.5 (H) 34 - 64 %  LYMPHOCYTES 18.6 (L) 28 - 48 %    MONOCYTES 10.6 1 - 13 %    EOSINOPHILS 0.6 0 - 5 %    BASOPHILS 0.4 0 - 3 %   METABOLIC PANEL, COMPREHENSIVE    Collection Time: 11/10/15  3:36 AM   Result Value Ref Range    Sodium 136 136 - 145 mEq/L    Potassium 3.7 3.5 - 5.1 mEq/L    Chloride 105 98 - 107 mEq/L    CO2 23 21 - 32 mEq/L    Glucose 105 74 - 106 mg/dl    BUN 27 (H) 7 - 25 mg/dl    Creatinine 1.7 (H) 0.6 - 1.3 mg/dl    GFR est AA 55.0       GFR est non-AA 45      Calcium 8.5 8.5 - 10.1 mg/dl    AST (SGOT) 24 15 - 37 U/L    ALT (SGPT) 27 12 - 78 U/L    Alk. phosphatase 68 45 - 117 U/L    Bilirubin, total 0.7 0.2 - 1.0 mg/dl    Protein, total 6.3 (L) 6.4 - 8.2 gm/dl    Albumin 3.2 (L) 3.4 - 5.0 gm/dl   HEMOGLOBIN A1C W/O EAG    Collection Time: 11/10/15  3:36 AM   Result Value Ref Range    Hemoglobin A1c 6.3 (H) 4.8 - 6.0 %   MAGNESIUM    Collection Time: 11/10/15  3:36 AM   Result Value Ref Range    Magnesium 2.0 1.6 - 2.6 mg/dl   PHOSPHORUS    Collection Time: 11/10/15  3:36 AM   Result Value Ref Range    Phosphorus 3.5 2.5 - 4.9 mg/dl   CK    Collection Time: 11/10/15  3:36 AM   Result Value Ref Range    CK 127 39 - 308 U/L   CK-MB,QT    Collection Time: 11/10/15  3:36 AM   Result Value Ref Range    CK - MB 9.3 (HH) 0.0 - 3.6 ng/ml   TROPONIN I    Collection Time: 11/10/15  3:36 AM   Result Value Ref Range    Troponin-I 2.400 (HH) 0.000 - 0.045 ng/ml   BILIRUBIN, DIRECT    Collection Time: 11/10/15  3:36 AM   Result Value Ref Range    Bilirubin, direct 0.1 0.0 - 0.2 mg/dl   TROPONIN I    Collection Time: 11/10/15  6:38 AM   Result Value Ref Range    Troponin-I 2.400 (HH) 0.000 - 0.045 ng/ml   PTT, CRRT PROTOCOL (PTT DRIP)    Collection Time: 11/10/15  7:47 AM   Result Value Ref Range    PTT drip 88.1 70.0 - 105.0 seconds       Imaging:  Xr Chest Sngl V    Result Date: 11/09/2015  Indication: Diffuse chest pain.     IMPRESSION: Portable AP upright view the chest exposed at 4:51 PM November 09, 2015 reveals the lungs are clear and the heart is mildly enlarged. Pulmonary vasculature is within normal limits.       Medications Reviewed:  Current Facility-Administered Medications   Medication Dose Route Frequency   ??? cyanocobalamin (VITAMIN B12) injection 1,000 mcg  1,000 mcg IntraMUSCular DAILY   ??? ergocalciferol (ERGOCALCIFEROL) capsule 50,000 Units  50,000 Units Oral Q7D   ??? isosorbide mononitrate ER (IMDUR) tablet 60 mg  60 mg Oral DAILY    ??? carvedilol (COREG) tablet 25 mg  25 mg  Oral BID WITH MEALS   ??? heparin (porcine) 1,000 unit/mL injection 3,510 Units  40 Units/kg (Adjusted) IntraVENous PRN   ??? heparin (porcine) 25,000 units in 0.45% saline 500 ml infusion  0-36 Units/kg/hr (Adjusted) IntraVENous TITRATE   ??? heparin (porcine) 1,000 unit/mL injection 7,020 Units  80 Units/kg (Adjusted) IntraVENous PRN   ??? amLODIPine (NORVASC) tablet 10 mg  10 mg Oral DAILY   ??? naloxone (NARCAN) injection 0.1 mg  0.1 mg IntraVENous PRN   ??? acetaminophen (TYLENOL) tablet 650 mg  650 mg Oral Q4H PRN    Or   ??? acetaminophen (TYLENOL) solution 650 mg  650 mg Oral Q4H PRN    Or   ??? acetaminophen (TYLENOL) suppository 650 mg  650 mg Rectal Q4H PRN   ??? HYDROcodone-acetaminophen (NORCO) 5-325 mg per tablet 1 Tab  1 Tab Oral Q4H PRN   ??? morphine injection 2 mg  2 mg IntraVENous Q4H PRN   ??? ondansetron (ZOFRAN) injection 4 mg  4 mg IntraVENous Q4H PRN   ??? hydrALAZINE (APRESOLINE) 20 mg/mL injection 20 mg  20 mg IntraVENous Q6H PRN   ??? hydrALAZINE (APRESOLINE) tablet 100 mg  100 mg Oral TID   ??? aspirin delayed-release tablet 81 mg  81 mg Oral DAILY   ??? atorvastatin (LIPITOR) tablet 80 mg  80 mg Oral QHS   ??? cloNIDine (CATAPRES) 0.3 mg/24 hr patch 1 Patch  1 Patch TransDERmal Q7D   ??? pneumococcal 13 val conj dip (PREVNAR-13) injection 0.5 mL  0.5 mL IntraMUSCular PRIOR TO DISCHARGE         Dragon medical dictation software was used for portions of this report. Unintended errors may occur.   Elon Spanner, MD  P# [phone]  November 10, 2015

## 2015-11-10 NOTE — Consults (Signed)
[facility]  CONSULTATION REPORT  NAME:  Gregory Soto, Gregory Soto  SEX:   M  ADMIT: 11/09/2015  DATE OF CONSULT: 11/09/2015  REFERRING PHYSICIAN:    DOB: 02-23-1961  MR#    [id number]  ROOM:  [id number]  ACCT#  [id number]    cc: [name]    REASON FOR CONSULTATION:  Positive troponin.    CHIEF COMPLAINT:  Left knee pain, high blood pressure, blood in urine noted today.    HISTORY OF PRESENT ILLNESS:  The patient is a 55 year old man that was visiting his mom and hurt his left knee.  He went to the [facility] doctor's office.  He had an x-ray of his left knee that showed a bone spur.  While he was there, they noted that he had an elevated blood pressure.  They did a urinalysis and checked lab work.  They gave him medication that was amlodipine to start for his blood pressure being elevated and he was sent home.  He took his first dose of amlodipine 10 mg prescribed once daily at 11:30 a.m. today.  He is not on any regular antihypertensive medications.  He does not take any regular prescription medications.  He does not have a primary care physician currently and has been looking for and is looking for a primary care physician.  He has not recently seen a primary care physician.  He states that when he got home, they called him as they noted blood in his urine.  He states that he has not seen any blood in his urine; this was microscopic.  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  He reported when he came to the emergency room, he has had no chest pain.  He has had no shortness of breath.  He states now that after the chest x-ray, he is having some slight left upper chest soreness.  He has had no history of cardiac disease with no prior myocardial infarction, no prior stent placement, no prior cardiac surgery.  He notes no known history of diabetes mellitus.  He notes no known history of kidney dysfunction;  however, he is aware that his kidney function was abnormal on the check today.  He has recently seen, he states, an eye doctor.  He has also seen and otologist as he states he has bad hearing.  He has seen a Education officer, community.  He lives in [place].  The patient states that he has been told he has hypertension before; however, he has not been on medication for this.  He is also told he had high cholesterol in the past; however, his cholesterol has not been recently checked and he has not been on prescription medication.    PAST MEDICAL HISTORY AND PAST SURGICAL HISTORY:  As noted above.      ALLERGIES:  PENICILLIN.    FAMILY HISTORY:  Sister having diabetes mellitus.    No known family history of heart disease.    HOME MEDICATIONS:  The patient is on no prescription medications.  He does take low-dose aspirin daily.    REVIEW OF SYSTEMS:  CONSTITUTIONAL:  No fevers or chills.  RESPIRATORY:  No shortness of breath.  CARDIOVASCULAR:  As noted in HPI.  GASTROINTESTINAL:  No abdominal pain, no recent heartburn symptoms.  GENITOURINARY:  No dysuria.  MUSCULOSKELETAL:  Left knee issues as noted in HPI.  NEUROLOGIC:  No unilateral weakness.  PSYCHIATRIC:  No depression.  HEMATOLOGIC:  No excessive bleeding.    SOCIAL HISTORY:  The patient is single.  There is no current or previous tobacco use.  The patient does note that his parents smoked.  The patient is from [place], [place].    PHYSICAL EXAMINATION:  VITAL SIGNS:  Blood pressure 227/108, heart rate 76, temperature 97.8, O2 saturation 98%, respiratory rate 18.  GENERAL:  No acute distress.  Well developed, well nourished.  HEENT:  Normocephalic, atraumatic.  Mucous membranes moist.  NECK:  Supple, no JVD.  CHEST:  Clear to auscultation bilaterally.  No crackles or rhonchi.  HEART:  Regular rhythm.  No murmurs, rubs, gallops.  GASTROINTESTINAL:  Abdomen is soft, nontender, nondistended, normoactive bowel sounds.  EXTREMITIES:  No clubbing, cyanosis, edema.   NEUROLOGIC:  Alert and oriented times 3, no obvious focal deficits.  PSYCHIATRIC:  Normal mood and affect.  SKIN:  Warm, well perfused.    LABORATORY DATA:  WBC 11.2, hemoglobin 16.5, hematocrit 48.2, platelets 185.    Urinalysis 100 protein, small blood.    PT 11.7, INR 1, PTT 31.1.    Sodium 136, potassium 3.8, chloride 104, CO2 24, BUN 24, creatinine 2.1, glucose 111.    Troponin 3.4.    Chest x-ray impression (11/09/2015):  Lungs are clear, and the heart is mildly enlarged.  Pulmonary vasculature is within normal limits.    IMPRESSION:  1.  Positive troponin I of 3.4 in the setting of no shortness of breath and no chest pain prior to hospitalization.  The patient does now note some slight left upper chest soreness following having the chest x-ray performed.  2.  Last knee injury today, which led to the patient going [place] doctor's office when he was visiting his mother where he had an x-ray of the left knee which showed, he reports, a bone spur.  During this visit, they noted that his blood pressure was elevated and he had a urinalysis and labs drawn.  He states that after he left, he was called with the urinalysis results that showed blood in his urine.  He has not visualized blood in his urine.  He was also prescribed at the visit today Amlodipine 10 mg daily.  3.  No current primary care physician.  4.  Elevated creatinine of 2.1 with no known history of renal dysfunction.    PLAN:  1.  Discussed with patient in detail and provided cardiac education.  Review with patient labs available.  2.  Will follow serial cardiac markers to assess peak.  3.  Will monitor the patient closely on telemetry.  4.  Will order echocardiogram to evaluate overall cardiac structure and function.  5.  Will check a.m. fasting lipid panel.  6.  Will likely recommend cardiac catheterization for Monday or early week; however, will need to follow up renal function.     7.   The patient is being admitted by the hospitalist internal medicine service and will defer to the hospitalist internal medicine service whether Nephrology consult is needed.    Thank you for allowing [place] to participate in the patient's care.      ___________________  Donell Beers MD  Dictated By:.   SB  D:11/09/2015 17:30:51  T: 11/10/2015 08:56:12  [id number]

## 2015-11-10 NOTE — Other (Signed)
Bedside and Verbal shift change report given to Shamir R Isidro, RN   (oncoming nurse) by Jennifer,RN (offgoing nurse). Report included the following information SBAR, Kardex, MAR, Recent Results and Cardiac Rhythm SR.

## 2015-11-11 LAB — PTT, CRRT PROTOCOL (PTT DRIP): PTT drip: 90.1 seconds (ref 70.0–105.0)

## 2015-11-11 MED FILL — CYANOCOBALAMIN 1,000 MCG/ML IJ SOLN: 1000 mcg/mL | INTRAMUSCULAR | Qty: 1

## 2015-11-11 MED FILL — CARVEDILOL 25 MG TAB: 25 mg | ORAL | Qty: 1

## 2015-11-11 MED FILL — HYDRALAZINE 50 MG TAB: 50 mg | ORAL | Qty: 2

## 2015-11-11 MED FILL — AMLODIPINE 5 MG TAB: 5 mg | ORAL | Qty: 2

## 2015-11-11 MED FILL — ISOSORBIDE MONONITRATE SR 60 MG 24 HR TAB: 60 mg | ORAL | Qty: 1

## 2015-11-11 MED FILL — HEPARIN (PORCINE)-0.45% NACL 25,000 UNIT/500 ML IV: 25000 unit/500 mL | INTRAVENOUS | Qty: 500

## 2015-11-11 MED FILL — LIPITOR 40 MG TABLET: 40 mg | ORAL | Qty: 2

## 2015-11-11 MED FILL — ASPIRIN 81 MG TAB, DELAYED RELEASE: 81 mg | ORAL | Qty: 1

## 2015-11-11 NOTE — Progress Notes (Signed)
[facility] ([facility]   CARDIOLOGY PROGRESS NOTE  RECS:  ?? BMP tomorrow morning.  If acceptable, will plan for cardiac cath.  NPO after midnight. Cont IV heparin today.  ?? BP up this morning after better control yesterday.  Follow.  Asx.   ?? Questions answered    ASSESSMENT:  Gregory Soto is a 55 y.o. male  with :  1.  NSTEMI  2.  HTN - untreated previously  3. HLD  4. AKI - creat 2.1 on presentation (unknown baseline) Improving.    Active Problems:    NSTEMI (non-ST elevated myocardial infarction) (H[facility]) (11/09/2015)        SUBJECTIVE:  No CP or SOB    VS:   Visit Vitals   ??? BP 157/79 (BP 1 Location: Left arm, BP Patient Position: At rest)   ??? Pulse 63   ??? Temp 97.2 ??F (36.2 ??C)   ??? Resp 18   ??? Ht 5\' 9"  (1.753 m)   ??? Wt 116.7 kg (257 lb 4.4 oz)   ??? SpO2 100%   ??? BMI 37.99 kg/m2     Last 3 Recorded Weights in this Encounter    11/09/15 1429 11/09/15 1449 11/11/15 0739   Weight: 113.4 kg (250 lb) 113.4 kg (250 lb) 116.7 kg (257 lb 4.4 oz)      Body mass index is 37.99 kg/(m^2).     Intake/Output Summary (Last 24 hours) at 11/11/15 0953  Last data filed at 11/11/15 0616   Gross per 24 hour   Intake           937.73 ml   Output                2 ml   Net           935.73 ml     TELE personally reviewed by me: NSR    EXAM:  General:  Skin warm, perfusion adequate  Eyes: EOMI, No icterus  Neck: Supple, JVD is absent  Lungs: clear, no rales, no rhonchi   Cardiac:  Regular Rhythm, No murmur, Gallops or Rubs  Abdomen: soft, nl bowel sounds  Ext: Edema is absent, pulses 2+  Neuro: Alert and oriented; Non-focal  Psych: Normal mood and affect    Labs:  Basic Metabolic Profile   Lab Results   Component Value Date/Time    NA 136 11/10/2015 03:36 AM    NA 136 11/09/2015 03:29 PM    K 3.7 11/10/2015 03:36 AM    K 3.8 11/09/2015 03:29 PM    CL 105 11/10/2015 03:36 AM    CL 104 11/09/2015 03:29 PM    CO2 23 11/10/2015 03:36 AM    CO2 24 11/09/2015 03:29 PM    BUN 27 (H) 11/10/2015 03:36 AM     BUN 24 11/09/2015 03:29 PM    CREA 1.7 (H) 11/10/2015 03:36 AM    CREA 2.1 (H) 11/09/2015 03:29 PM    GLU 105 11/10/2015 03:36 AM    GLU 111 (H) 11/09/2015 03:29 PM    CA 8.5 11/10/2015 03:36 AM    CA 9.0 11/09/2015 03:29 PM    MG 2.0 11/10/2015 03:36 AM    PHOS 3.5 11/10/2015 03:36 AM        CBC w/Diff    Lab Results   Component Value Date/Time    WBC 10.1 11/10/2015 03:36 AM    WBC 13.2 (H) 11/09/2015 11:54 PM    WBC 11.2 (H) 11/09/2015 03:29 PM  RBC 4.69 11/10/2015 03:36 AM    RBC 5.05 11/09/2015 11:54 PM    RBC 5.50 11/09/2015 03:29 PM    HGB 14.3 11/10/2015 03:36 AM    HGB 15.3 11/09/2015 11:54 PM    HGB 16.5 11/09/2015 03:29 PM    HCT 41.3 11/10/2015 03:36 AM    HCT 44.4 11/09/2015 11:54 PM    HCT 48.2 11/09/2015 03:29 PM    MCV 88.1 11/10/2015 03:36 AM    MCV 87.9 11/09/2015 11:54 PM    MCV 87.6 11/09/2015 03:29 PM    MCH 30.5 11/10/2015 03:36 AM    MCH 30.3 11/09/2015 11:54 PM    MCH 30.0 11/09/2015 03:29 PM    MCHC 34.6 11/10/2015 03:36 AM    MCHC 34.5 11/09/2015 11:54 PM    MCHC 34.2 11/09/2015 03:29 PM    PLT 200 11/10/2015 03:36 AM    PLT 213 11/09/2015 11:54 PM    PLT 185 11/09/2015 03:29 PM    Lab Results   Component Value Date/Time    GRANS 69.5 (H) 11/10/2015 03:36 AM    GRANS 73.8 (H) 11/09/2015 11:54 PM    GRANS 78.7 (H) 11/09/2015 03:29 PM    LYMPH 18.6 (L) 11/10/2015 03:36 AM    LYMPH 15.6 (L) 11/09/2015 11:54 PM    LYMPH 11.7 (L) 11/09/2015 03:29 PM    MONOS 10.6 11/10/2015 03:36 AM    MONOS 9.5 11/09/2015 11:54 PM    MONOS 8.4 11/09/2015 03:29 PM    EOS 0.6 11/10/2015 03:36 AM    EOS 0.5 11/09/2015 11:54 PM    EOS 0.4 11/09/2015 03:29 PM    BASOS 0.4 11/10/2015 03:36 AM    BASOS 0.3 11/09/2015 11:54 PM    BASOS 0.4 11/09/2015 03:29 PM        Cardiac Enzymes   Lab Results   Component Value Date/Time    CPK 127 11/10/2015 03:36 AM    CPK 141 11/09/2015 10:07 PM    CKMB 9.3 (HH) 11/10/2015 03:36 AM    CKMB 10.2 (HH) 11/09/2015 10:07 PM        Coagulation   Lab Results    Component Value Date/Time    PTP 11.7 11/09/2015 05:01 PM    INR 1.0 11/09/2015 05:01 PM    APTT 31.1 11/09/2015 05:01 PM        Medications:    Current Facility-Administered Medications   Medication Dose Route Frequency   ??? cyanocobalamin (VITAMIN B12) injection 1,000 mcg  1,000 mcg IntraMUSCular DAILY   ??? ergocalciferol (ERGOCALCIFEROL) capsule 50,000 Units  50,000 Units Oral Q7D   ??? isosorbide mononitrate ER (IMDUR) tablet 60 mg  60 mg Oral DAILY   ??? carvedilol (COREG) tablet 25 mg  25 mg Oral BID WITH MEALS   ??? heparin (porcine) 25,000 units in 0.45% saline 500 ml infusion  0-36 Units/kg/hr (Adjusted) IntraVENous TITRATE   ??? amLODIPine (NORVASC) tablet 10 mg  10 mg Oral DAILY   ??? hydrALAZINE (APRESOLINE) tablet 100 mg  100 mg Oral TID   ??? aspirin delayed-release tablet 81 mg  81 mg Oral DAILY   ??? atorvastatin (LIPITOR) tablet 80 mg  80 mg Oral QHS   ??? cloNIDine (CATAPRES) 0.3 mg/24 hr patch 1 Patch  1 Patch TransDERmal Q7D   ??? pneumococcal 13 val conj dip (PREVNAR-13) injection 0.5 mL  0.5 mL IntraMUSCular PRIOR TO DISCHARGE          Rolin Barry, DO  [facility] ([facility])  [phone]  November 11, 2015  9:53 AM

## 2015-11-11 NOTE — Other (Signed)
Bedside and Verbal shift change report given to Alan Ripper, RN (oncoming nurse) by Mosetta Anis, RN (offgoing nurse). Report included the following information SBAR, Kardex and MAR.

## 2015-11-11 NOTE — Progress Notes (Signed)
Progress Note    Patient: Gregory Soto   Age:  55 y.o.  DOA: 11/09/2015   Admit Dx / CC: NSTEMI (non-ST elevated myocardial infarction) ([place])  NSTEMI (non-ST elevated myocardial infarction) ([place])  LOS:  LOS: 2 days     Active Problems   Active Problems:    NSTEMI (non-ST elevated myocardial infarction) ([place]) (11/09/2015)      Additional Assessments   1. Acute non-ST elevation MI  2. Hypertensive emergency  3. Deafness  4. Obesity  5. Acute kidney injury  6. Left knee sprain   7. Prediabetes/metabolic syndrome  8. Vitamin D deficiency  9. B12 deficiency  Plan:   Patient be continued on current therapy, we'll repeat basic metabolic profile in the morning if creatinine less than 1.4 patient can safely go for cardiac catheterization  Patient was advised not to eat or drink anything after midnight.  Subjective:   No new complaints today  Objective:     Visit Vitals   ??? BP 167/65 (BP 1 Location: Left arm, BP Patient Position: Supine)   ??? Pulse 64   ??? Temp 97.1 ??F (36.2 ??C)   ??? Resp 18   ??? Ht 5' 9" (1.753 m)   ??? Wt 116.7 kg (257 lb 4.4 oz)   ??? SpO2 98%   ??? BMI 37.99 kg/m2       Physical Exam:  General appearance: No acute distress  Lungs: CTA bilaterally  Heart: RRR, S1, S2 normal, no murmur,  Abdomen: soft, non-tender. Bowel sounds Audible,   Extremities: Trace edema, distal pulses palpable  Neurologic: Awake and alert    Intake and Output:  Current Shift:  07/30 0701 - 07/30 1900  In: 240 [P.O.:240]  Out: -   Last three shifts:  07/28 1901 - 07/30 0700  In: 2268.1 [P.O.:840; I.V.:1428.1]  Out: 252 [Urine:252]    Lab/Data Reviewed:  Recent Results (from the past 48 hour(s))   CBC WITH AUTOMATED DIFF    Collection Time: 11/09/15  3:29 PM   Result Value Ref Range    WBC 11.2 (H) 4.0 - 11.0 1000/mm3    RBC 5.50 3.80 - 5.70 M/uL    HGB 16.5 12.4 - 17.2 gm/dl    HCT 48.2 37.0 - 50.0 %    MCV 87.6 80.0 - 98.0 fL    MCH 30.0 23.0 - 34.6 pg    MCHC 34.2 30.0 - 36.0 gm/dl    PLATELET 185 140 - 450 1000/mm3     MPV 11.0 (H) 6.0 - 10.0 fL    RDW-SD 43.9 35.1 - 43.9      NRBC 0 0 - 0      IMMATURE GRANULOCYTES 0.4 0.0 - 3.0 %    NEUTROPHILS 78.7 (H) 34 - 64 %    LYMPHOCYTES 11.7 (L) 28 - 48 %    MONOCYTES 8.4 1 - 13 %    EOSINOPHILS 0.4 0 - 5 %    BASOPHILS 0.4 0 - 3 %   METABOLIC PANEL, BASIC    Collection Time: 11/09/15  3:29 PM   Result Value Ref Range    Sodium 136 136 - 145 mEq/L    Potassium 3.8 3.5 - 5.1 mEq/L    Chloride 104 98 - 107 mEq/L    CO2 24 21 - 32 mEq/L    Glucose 111 (H) 74 - 106 mg/dl    BUN 24 7 - 25 mg/dl    Creatinine 2.1 (H) 0.6 - 1.3 mg/dl  GFR est AA 43.0      GFR est non-AA 35      Calcium 9.0 8.5 - 10.1 mg/dl   TROPONIN I    Collection Time: 11/09/15  3:29 PM   Result Value Ref Range    Troponin-I 3.400 (HH) 0.000 - 0.045 ng/ml   EKG, 12 LEAD, INITIAL    Collection Time: 11/09/15  3:35 PM   Result Value Ref Range    Ventricular Rate 80 BPM    Atrial Rate 80 BPM    P-R Interval 152 ms    QRS Duration 106 ms    Q-T Interval 428 ms    QTC Calculation (Bezet) 493 ms    Calculated P Axis 35 degrees    Calculated R Axis 20 degrees    Calculated T Axis -65 degrees    Diagnosis       Sinus rhythm with premature atrial complexes  Possible Left atrial enlargement  ST & T wave abnormality, consider inferolateral ischemia  Prolonged QT  Abnormal ECG  When compared with ECG of 07-Apr-1997 22:10,  Anterolateral q waves now present.    Confirmed by Haze Boyden, MD, Broadus Piotr ([id number]) on 11/10/2015 9:48:53 AM     URINALYSIS W/ RFLX MICROSCOPIC    Collection Time: 11/09/15  3:44 PM   Result Value Ref Range    Color YELLOW YELLOW,STRAW      Appearance CLEAR CLEAR      Glucose NEGATIVE NEGATIVE,Negative mg/dl    Bilirubin NEGATIVE NEGATIVE,Negative      Ketone NEGATIVE NEGATIVE,Negative mg/dl    Specific gravity 1.025 1.005 - 1.030      Blood SMALL (A) NEGATIVE,Negative      pH (UA) 5.5 5.0 - 9.0      Protein 100 (A) NEGATIVE,Negative mg/dl    Urobilinogen 0.2 0.0 - 1.0 mg/dl    Nitrites NEGATIVE NEGATIVE,Negative       Leukocyte Esterase NEGATIVE NEGATIVE,Negative     POC URINE MICROSCOPIC    Collection Time: 11/09/15  3:44 PM   Result Value Ref Range    WBC OCCASIONAL /HPF    RBC OCCASIONAL /HPF    Hyaline cast OCCASIONAL /LPF   PROTHROMBIN TIME + INR    Collection Time: 11/09/15  5:01 PM   Result Value Ref Range    Prothrombin time 11.7 10.2 - 12.9 seconds    INR 1.0 0.1 - 1.1     PTT    Collection Time: 11/09/15  5:01 PM   Result Value Ref Range    aPTT 31.1 25.1 - 36.5 seconds   GLUCOSE, POC    Collection Time: 11/09/15  6:11 PM   Result Value Ref Range    Glucose (POC) 123 (H) 65 - 105 mg/dL   TROPONIN I    Collection Time: 11/09/15  6:27 PM   Result Value Ref Range    Troponin-I 2.930 (HH) 0.000 - 0.045 ng/ml   CK    Collection Time: 11/09/15 10:07 PM   Result Value Ref Range    CK 141 39 - 308 U/L   CK-MB,QT    Collection Time: 11/09/15 10:07 PM   Result Value Ref Range    CK - MB 10.2 (HH) 0.0 - 3.6 ng/ml   TROPONIN I    Collection Time: 11/09/15 10:07 PM   Result Value Ref Range    Troponin-I 2.420 (HH) 0.000 - 0.045 ng/ml   CBC WITH AUTOMATED DIFF    Collection Time: 11/09/15 11:54 PM   Result Value Ref Range  WBC 13.2 (H) 4.0 - 11.0 1000/mm3    RBC 5.05 3.80 - 5.70 M/uL    HGB 15.3 12.4 - 17.2 gm/dl    HCT 44.4 37.0 - 50.0 %    MCV 87.9 80.0 - 98.0 fL    MCH 30.3 23.0 - 34.6 pg    MCHC 34.5 30.0 - 36.0 gm/dl    PLATELET 213 140 - 450 1000/mm3    MPV 10.7 (H) 6.0 - 10.0 fL    RDW-SD 43.9 35.1 - 43.9      NRBC 0 0 - 0      IMMATURE GRANULOCYTES 0.3 0.0 - 3.0 %    NEUTROPHILS 73.8 (H) 34 - 64 %    LYMPHOCYTES 15.6 (L) 28 - 48 %    MONOCYTES 9.5 1 - 13 %    EOSINOPHILS 0.5 0 - 5 %    BASOPHILS 0.3 0 - 3 %   TROPONIN I    Collection Time: 11/09/15 11:54 PM   Result Value Ref Range    Troponin-I 2.770 (HH) 0.000 - 0.045 ng/ml   PTT, CRRT PROTOCOL (PTT DRIP)    Collection Time: 11/09/15 11:54 PM   Result Value Ref Range    PTT drip 185.2 (HH) 70.0 - 105.0 seconds   LIPID PANEL    Collection Time: 11/10/15  3:36 AM    Result Value Ref Range    Cholesterol, total 179 140 - 199 mg/dl    HDL Cholesterol 34 (L) 40 - 96 mg/dl    Triglyceride 139 29 - 150 mg/dl    LDL, calculated 117 0 - 130 mg/dl    CHOL/HDL Ratio 5.3 (H) 0.0 - 5.0 Ratio   TSH 3RD GENERATION    Collection Time: 11/10/15  3:36 AM   Result Value Ref Range    TSH 2.840 0.358 - 3.740 uIU/mL   T4, FREE    Collection Time: 11/10/15  3:36 AM   Result Value Ref Range    Free T4 1.01 0.76 - 1.46 ng/dl   VITAMIN B12    Collection Time: 11/10/15  3:36 AM   Result Value Ref Range    Vitamin B12 195 193 - 986 pg/ml   VITAMIN D, 25 HYDROXY    Collection Time: 11/10/15  3:36 AM   Result Value Ref Range    Vitamin D, 25-OH, Total 21.0 (L) 30.0 - 100.0 ng/ml   CBC WITH AUTOMATED DIFF    Collection Time: 11/10/15  3:36 AM   Result Value Ref Range    WBC 10.1 4.0 - 11.0 1000/mm3    RBC 4.69 3.80 - 5.70 M/uL    HGB 14.3 12.4 - 17.2 gm/dl    HCT 41.3 37.0 - 50.0 %    MCV 88.1 80.0 - 98.0 fL    MCH 30.5 23.0 - 34.6 pg    MCHC 34.6 30.0 - 36.0 gm/dl    PLATELET 200 140 - 450 1000/mm3    MPV 11.3 (H) 6.0 - 10.0 fL    RDW-SD 44.2 (H) 35.1 - 43.9      NRBC 0 0 - 0      IMMATURE GRANULOCYTES 0.3 0.0 - 3.0 %    NEUTROPHILS 69.5 (H) 34 - 64 %    LYMPHOCYTES 18.6 (L) 28 - 48 %    MONOCYTES 10.6 1 - 13 %    EOSINOPHILS 0.6 0 - 5 %    BASOPHILS 0.4 0 - 3 %   METABOLIC PANEL, COMPREHENSIVE    Collection Time:  11/10/15  3:36 AM   Result Value Ref Range    Sodium 136 136 - 145 mEq/L    Potassium 3.7 3.5 - 5.1 mEq/L    Chloride 105 98 - 107 mEq/L    CO2 23 21 - 32 mEq/L    Glucose 105 74 - 106 mg/dl    BUN 27 (H) 7 - 25 mg/dl    Creatinine 1.7 (H) 0.6 - 1.3 mg/dl    GFR est AA 55.0      GFR est non-AA 45      Calcium 8.5 8.5 - 10.1 mg/dl    AST (SGOT) 24 15 - 37 U/L    ALT (SGPT) 27 12 - 78 U/L    Alk. phosphatase 68 45 - 117 U/L    Bilirubin, total 0.7 0.2 - 1.0 mg/dl    Protein, total 6.3 (L) 6.4 - 8.2 gm/dl    Albumin 3.2 (L) 3.4 - 5.0 gm/dl   HEMOGLOBIN A1C W/O EAG     Collection Time: 11/10/15  3:36 AM   Result Value Ref Range    Hemoglobin A1c 6.3 (H) 4.8 - 6.0 %   MAGNESIUM    Collection Time: 11/10/15  3:36 AM   Result Value Ref Range    Magnesium 2.0 1.6 - 2.6 mg/dl   PHOSPHORUS    Collection Time: 11/10/15  3:36 AM   Result Value Ref Range    Phosphorus 3.5 2.5 - 4.9 mg/dl   CK    Collection Time: 11/10/15  3:36 AM   Result Value Ref Range    CK 127 39 - 308 U/L   CK-MB,QT    Collection Time: 11/10/15  3:36 AM   Result Value Ref Range    CK - MB 9.3 (HH) 0.0 - 3.6 ng/ml   TROPONIN I    Collection Time: 11/10/15  3:36 AM   Result Value Ref Range    Troponin-I 2.400 (HH) 0.000 - 0.045 ng/ml   BILIRUBIN, DIRECT    Collection Time: 11/10/15  3:36 AM   Result Value Ref Range    Bilirubin, direct 0.1 0.0 - 0.2 mg/dl   TROPONIN I    Collection Time: 11/10/15  6:38 AM   Result Value Ref Range    Troponin-I 2.400 (HH) 0.000 - 0.045 ng/ml   PTT, CRRT PROTOCOL (PTT DRIP)    Collection Time: 11/10/15  7:47 AM   Result Value Ref Range    PTT drip 88.1 70.0 - 105.0 seconds   PTT, CRRT PROTOCOL (PTT DRIP)    Collection Time: 11/10/15  3:53 PM   Result Value Ref Range    PTT drip 84.8 70.0 - 105.0 seconds   PTT, CRRT PROTOCOL (PTT DRIP)    Collection Time: 11/11/15  4:08 AM   Result Value Ref Range    PTT drip 90.1 70.0 - 105.0 seconds       Imaging:  No results found.    Medications Reviewed:  Current Facility-Administered Medications   Medication Dose Route Frequency   ??? cyanocobalamin (VITAMIN B12) injection 1,000 mcg  1,000 mcg IntraMUSCular DAILY   ??? ergocalciferol (ERGOCALCIFEROL) capsule 50,000 Units  50,000 Units Oral Q7D   ??? isosorbide mononitrate ER (IMDUR) tablet 60 mg  60 mg Oral DAILY   ??? carvedilol (COREG) tablet 25 mg  25 mg Oral BID WITH MEALS   ??? heparin (porcine) 1,000 unit/mL injection 3,510 Units  40 Units/kg (Adjusted) IntraVENous PRN   ??? heparin (porcine) 25,000 units in 0.45% saline 500  ml infusion  0-36 Units/kg/hr (Adjusted) IntraVENous TITRATE    ??? heparin (porcine) 1,000 unit/mL injection 7,020 Units  80 Units/kg (Adjusted) IntraVENous PRN   ??? amLODIPine (NORVASC) tablet 10 mg  10 mg Oral DAILY   ??? naloxone (NARCAN) injection 0.1 mg  0.1 mg IntraVENous PRN   ??? acetaminophen (TYLENOL) tablet 650 mg  650 mg Oral Q4H PRN    Or   ??? acetaminophen (TYLENOL) solution 650 mg  650 mg Oral Q4H PRN    Or   ??? acetaminophen (TYLENOL) suppository 650 mg  650 mg Rectal Q4H PRN   ??? HYDROcodone-acetaminophen (NORCO) 5-325 mg per tablet 1 Tab  1 Tab Oral Q4H PRN   ??? morphine injection 2 mg  2 mg IntraVENous Q4H PRN   ??? ondansetron (ZOFRAN) injection 4 mg  4 mg IntraVENous Q4H PRN   ??? hydrALAZINE (APRESOLINE) 20 mg/mL injection 20 mg  20 mg IntraVENous Q6H PRN   ??? hydrALAZINE (APRESOLINE) tablet 100 mg  100 mg Oral TID   ??? aspirin delayed-release tablet 81 mg  81 mg Oral DAILY   ??? atorvastatin (LIPITOR) tablet 80 mg  80 mg Oral QHS   ??? cloNIDine (CATAPRES) 0.3 mg/24 hr patch 1 Patch  1 Patch TransDERmal Q7D   ??? pneumococcal 13 val conj dip (PREVNAR-13) injection 0.5 mL  0.5 mL IntraMUSCular PRIOR TO DISCHARGE         Dragon medical dictation software was used for portions of this report. Unintended errors may occur.   Elon Spanner, MD  P# [phone]  November 11, 2015

## 2015-11-11 NOTE — Progress Notes (Signed)
Patient admitted on 11/09/2015 from Home with   Chief Complaint   Patient presents with   ??? Hypertension   ??? Abnormal Lab Results        The patient has been admitted to the hospital 1 times in the past 12 months.    Tentative dc plan:  Home follow up with [facility]-patient has no pcp. 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 He does have concerns of the meds he will be going home on. Patient mother will transport home upon discharge.     Facility if plan no    Anticipated Discharge Date:   07/31-08/01    PCP:  [facility] referral    Specialists:     no    Dialysis Unit/ chair time / access:    no    Pharmacy:     [facility] or [facility]  Any issues getting medications no    DME:    none    Home Environment:    Lives at [address]  [place] [address]    [phone].     Prior to admission open services:     no    Home Health Agency-     no    Personal Care Agency-    no    Extended Emergency Contact Information  Primary Emergency Contact: [facility] [place]  Home Phone: [phone]  Relation: Mother      Transportation:     Mother will transport home    Therapy Recommendations:  OT :y/n     no  PT :y/n     no  SLP :y/n    no    RT Home O2 Evaluation :y/n     no    Wound Care: y/n     no    Change consult (formerly [facility]) :    no    Case Management Assessment    ABUSE/NEGLECT SCREENING   Physical Abuse/Neglect: Denies   Sexual Abuse: Denies   Sexual Abuse: Denies   Other Abuse/Issues: Denies          PRIMARY DECISION MAKER   Primary Decision Maker Name: Gregory Soto   Primary Decision Maker Phone Number: [phone]                           CARE MANAGEMENT INTERVENTIONS   Readmission Interview Completed: Not Applicable   PCP Verified by CM: Yes           Mode of Transport at Discharge: Self       Transition of Care Consult (CM Consult): Discharge Planning                               Current Support Network: Lives Alone    Reason for Referral: DCP Rounds   History Provided By: Patient   Patient Orientation: Alert and Oriented   Cognition: Alert   Support System Response: Unavailable   Previous Living Arrangement: Lives Alone Independent       Prior Functional Level: Independent in ADLs/IADLs   Current Functional Level: Independent in ADLs/IADLs   Primary Language: English   Can patient return to prior living arrangement: Yes   Ability to make needs known:: Fair   Family able to assist with home care needs:: Yes  Types of Needs Identified: Disease Management Education, Treatment Education, Emotional Support   Anticipated Discharge Needs: [facility]CC   Confirm Follow Up Transport: Family       Plan discussed with Pt/Family/Caregiver: Yes   Freedom of Choice Offered: Yes      DISCHARGE LOCATION   Discharge Placement: Home

## 2015-11-11 NOTE — Progress Notes (Signed)
2d echo done

## 2015-11-11 NOTE — Other (Signed)
Bedside and Verbal shift change report given to Shamir R Isidro, RN   (oncoming nurse) by Amanda,RN (offgoing nurse). Report included the following information SBAR, Kardex, MAR, Recent Results and Cardiac Rhythm SR.

## 2015-11-12 LAB — METABOLIC PANEL, BASIC
BUN: 33 mg/dl — ABNORMAL HIGH (ref 7–25)
CO2: 22 mEq/L (ref 21–32)
Calcium: 8.5 mg/dl (ref 8.5–10.1)
Chloride: 106 mEq/L (ref 98–107)
Creatinine: 2.1 mg/dl — ABNORMAL HIGH (ref 0.6–1.3)
GFR est AA: 43
GFR est non-AA: 35
Glucose: 105 mg/dl (ref 74–106)
Potassium: 3.8 mEq/L (ref 3.5–5.1)
Sodium: 137 mEq/L (ref 136–145)

## 2015-11-12 LAB — PTT, CRRT PROTOCOL (PTT DRIP): PTT drip: 79 seconds (ref 70.0–105.0)

## 2015-11-12 MED ORDER — SODIUM CHLORIDE 0.45 % IV
0.45 % | INTRAVENOUS | Status: DC
Start: 2015-11-12 — End: 2015-11-14
  Administered 2015-11-12 – 2015-11-14 (×6): via INTRAVENOUS

## 2015-11-12 MED FILL — CARVEDILOL 25 MG TAB: 25 mg | ORAL | Qty: 1

## 2015-11-12 MED FILL — HYDRALAZINE 50 MG TAB: 50 mg | ORAL | Qty: 2

## 2015-11-12 MED FILL — AMLODIPINE 5 MG TAB: 5 mg | ORAL | Qty: 2

## 2015-11-12 MED FILL — ISOSORBIDE MONONITRATE SR 60 MG 24 HR TAB: 60 mg | ORAL | Qty: 1

## 2015-11-12 MED FILL — LIPITOR 40 MG TABLET: 40 mg | ORAL | Qty: 2

## 2015-11-12 MED FILL — CYANOCOBALAMIN 1,000 MCG/ML IJ SOLN: 1000 mcg/mL | INTRAMUSCULAR | Qty: 1

## 2015-11-12 MED FILL — HEPARIN (PORCINE)-0.45% NACL 25,000 UNIT/500 ML IV: 25000 unit/500 mL | INTRAVENOUS | Qty: 500

## 2015-11-12 MED FILL — ASPIRIN 81 MG TAB, DELAYED RELEASE: 81 mg | ORAL | Qty: 1

## 2015-11-12 MED FILL — SODIUM CHLORIDE 0.45 % IV: 0.45 % | INTRAVENOUS | Qty: 1000

## 2015-11-12 NOTE — Progress Notes (Signed)
OCCUPATIONAL THERAPY Evaluation        Patient: Gregory Soto (55 y.o. male)  Room: 2202/2202    Date: 11/12/2015  Time:  1145  Primary Diagnosis: NSTEMI (non-ST elevated myocardial infarction) [facility])  NSTEMI (non-ST elevated myocardial infarction) [facility])    OBJECTIVE:  Patient seen for skilled occupational therapy evaluation, Patient presents with independence with ADL's, ambulation, is employed, drives and has no limitations functionally requiring additional OT services at this time.      PLAN: D/C OT .         Jill Side, OT

## 2015-11-12 NOTE — Progress Notes (Signed)
Bedside and Verbal shift change report given to Jolanda Short (oncoming nurse) by Marian Sorrow (offgoing nurse). Report included the following information SBAR and Cardiac Rhythm NSR.

## 2015-11-12 NOTE — Progress Notes (Signed)
[facility] ([facility]   CARDIOLOGY PROGRESS NOTE  RECS:  1. With increasing azotemia consider Nephrology consult  2. Continue med mgmt for poss NSTEMI and risk factors  3. Delay cath until renal fxn improves and stable    ASSESSMENT:  Gregory Soto is a 55 y.o. male with:   1. ??NSTEMI - static +TnI, LVEF=60% severe conc LVH, no signif valve dis  2. ??HTN - untreated previously, severe LVH by 2DE  3. HLD  4. AKI ? Vs CKD - creat 2.1 on presentation (unknown baseline) worsening BUN  5. Prediabetes/metabolic syndrome  6. DJD acute injury  7. Allergy - PCN      SUBJECTIVE:  No events overnight, No CP or SOB, no dizziness or bleeding     VS:   Visit Vitals   ??? BP 146/75 (BP 1 Location: Right arm, BP Patient Position: Supine)   ??? Pulse (!) 58   ??? Temp 97.7 ??F (36.5 ??C)   ??? Resp 20   ??? Ht 5\' 9"  (1.753 m)   ??? Wt 116.9 kg (257 lb 11.5 oz)   ??? SpO2 97%   ??? BMI 38.06 kg/m2     Last 3 Recorded Weights in this Encounter    11/09/15 1449 11/11/15 0739 11/12/15 0648   Weight: 113.4 kg (250 lb) 116.7 kg (257 lb 4.4 oz) 116.9 kg (257 lb 11.5 oz)       Body mass index is 38.06 kg/(m^2).     Intake/Output Summary (Last 24 hours) at 11/12/15 0747  Last data filed at 11/12/15 0605   Gross per 24 hour   Intake           866.38 ml   Output                0 ml   Net           866.38 ml       TELE personally reviewed by me:  NSR, VPC    EXAM:  General:  Skin warm, perfusion adequate  Neck: JVD is absent  Lungs: clear, no rales, no rhonchi   Cardiac:  Regular Rhythm, No murmur, Gallops or Rubs  Abdomen: soft, nl bowel sounds  Ext: Edema is absent  Neuro: Alert and oriented; Nonfocal    CXR personally reviewed by me:  NAD    ECG personally reviewed by me:  unchanged    2DE: severe conc LVH, EF=60%, no signif valve dis    Labs:  Basic Metabolic Profile   Recent Labs      11/12/15   0425  11/10/15   0336  11/09/15   1529   NA  137  136  136   CO2  22  23  24    BUN  33*  27*  24                CBC w/Diff    Recent Labs       11/10/15   0336  11/09/15   2354  11/09/15   1529   WBC  10.1  13.2*  11.2*   RBC  4.69  5.05  5.50   HCT  41.3  44.4  48.2   MCV  88.1  87.9  87.6   MCH  30.5  30.3  30.0   MCHC  34.6  34.5  34.2             Cardiac Enzymes   Recent Labs  11/10/15   0336  11/09/15   2207   CPK  127  141   CKMB  9.3*  10.2*            Coagulation   Recent Labs      11/09/15   1701   INR  1.0   APTT  31.1        Medications:  Current Facility-Administered Medications   Medication Dose Route Frequency   ??? cyanocobalamin (VITAMIN B12) injection 1,000 mcg  1,000 mcg IntraMUSCular DAILY   ??? ergocalciferol (ERGOCALCIFEROL) capsule 50,000 Units  50,000 Units Oral Q7D   ??? isosorbide mononitrate ER (IMDUR) tablet 60 mg  60 mg Oral DAILY   ??? carvedilol (COREG) tablet 25 mg  25 mg Oral BID WITH MEALS   ??? heparin (porcine) 25,000 units in 0.45% saline 500 ml infusion  0-36 Units/kg/hr (Adjusted) IntraVENous TITRATE   ??? amLODIPine (NORVASC) tablet 10 mg  10 mg Oral DAILY   ??? hydrALAZINE (APRESOLINE) tablet 100 mg  100 mg Oral TID   ??? aspirin delayed-release tablet 81 mg  81 mg Oral DAILY   ??? atorvastatin (LIPITOR) tablet 80 mg  80 mg Oral QHS   ??? cloNIDine (CATAPRES) 0.3 mg/24 hr patch 1 Patch  1 Patch TransDERmal Q7D   ??? pneumococcal 13 val conj dip (PREVNAR-13) injection 0.5 mL  0.5 mL IntraMUSCular PRIOR TO DISCHARGE       [name] C. Hyacinth Meeker, MD, [facility], FSCAI  [facility], [facility]  Phone:  [phone]      11/12/2015  7:47 AM

## 2015-11-12 NOTE — Other (Signed)
Bedside and Verbal shift change report given to James A Akinade, RN   (oncoming nurse) by Olga, RN (offgoing nurse). Report included the following information SBAR, Kardex, MAR and Cardiac Rhythm SR.

## 2015-11-12 NOTE — Progress Notes (Signed)
Progress Note    Patient: Gregory Soto   Age:  55 y.o.  DOA: 11/09/2015   Admit Dx / CC: NSTEMI (non-ST elevated myocardial infarction) ([facility])  NSTEMI (non-ST elevated myocardial infarction) ([facility])  LOS:  LOS: 3 days     Active Problems   Active Problems:    NSTEMI (non-ST elevated myocardial infarction) ([facility]) (11/09/2015)      Additional Assessments   1. Acute non-ST elevation MI  2. Hypertensive emergency  3. Deafness  4. Obesity  5. Acute kidney injury  6. Left knee sprain   7. Prediabetes/metabolic syndrome  8. Vitamin D deficiency  9. B12 deficiency  Plan:   Patient be continued on current therapy, patient's basic metabolic profile was repeated today which showed creatinine stayed at 2.1, patient be started on IV fluids, BASIC metabolic profile be checked daily, once creatinine less than 1.4 he'll be scheduled for cardiac catheterization, patient be restarted on cardiac diet  Subjective:   No new complaints today  Objective:     Visit Vitals   ??? BP 146/75 (BP 1 Location: Right arm, BP Patient Position: Supine)   ??? Pulse (!) 58   ??? Temp 97.7 ??F (36.5 ??C)   ??? Resp 20   ??? Ht 5\' 9"  (1.753 m)   ??? Wt 116.9 kg (257 lb 11.5 oz)   ??? SpO2 97%   ??? BMI 38.06 kg/m2       Physical Exam:  General appearance: No acute distress  Lungs: CTA bilaterally  Heart: RRR, S1, S2 normal, no murmur,  Extremities: no edema,   Neurologic: Awake and alert    Intake and Output:  Current Shift:     Last three shifts:  07/29 1901 - 07/31 0700  In: 1444.1 [P.O.:240; I.V.:1204.1]  Out: -     Lab/Data Reviewed:  Recent Results (from the past 48 hour(s))   PTT, CRRT PROTOCOL (PTT DRIP)    Collection Time: 11/10/15  3:53 PM   Result Value Ref Range    PTT drip 84.8 70.0 - 105.0 seconds   PTT, CRRT PROTOCOL (PTT DRIP)    Collection Time: 11/11/15  4:08 AM   Result Value Ref Range    PTT drip 90.1 70.0 - 105.0 seconds   METABOLIC PANEL, BASIC    Collection Time: 11/12/15  4:25 AM   Result Value Ref Range    Sodium 137 136 - 145 mEq/L     Potassium 3.8 3.5 - 5.1 mEq/L    Chloride 106 98 - 107 mEq/L    CO2 22 21 - 32 mEq/L    Glucose 105 74 - 106 mg/dl    BUN 33 (H) 7 - 25 mg/dl    Creatinine 2.1 (H) 0.6 - 1.3 mg/dl    GFR est AA 94.8      GFR est non-AA 35      Calcium 8.5 8.5 - 10.1 mg/dl   PTT, CRRT PROTOCOL (PTT DRIP)    Collection Time: 11/12/15  4:25 AM   Result Value Ref Range    PTT drip 79.0 70.0 - 105.0 seconds       Imaging:  No results found.    Medications Reviewed:  Current Facility-Administered Medications   Medication Dose Route Frequency   ??? 0.45% sodium chloride infusion  100 mL/hr IntraVENous CONTINUOUS   ??? cyanocobalamin (VITAMIN B12) injection 1,000 mcg  1,000 mcg IntraMUSCular DAILY   ??? ergocalciferol (ERGOCALCIFEROL) capsule 50,000 Units  50,000 Units Oral Q7D   ??? isosorbide mononitrate  ER (IMDUR) tablet 60 mg  60 mg Oral DAILY   ??? carvedilol (COREG) tablet 25 mg  25 mg Oral BID WITH MEALS   ??? heparin (porcine) 1,000 unit/mL injection 3,510 Units  40 Units/kg (Adjusted) IntraVENous PRN   ??? heparin (porcine) 25,000 units in 0.45% saline 500 ml infusion  0-36 Units/kg/hr (Adjusted) IntraVENous TITRATE   ??? heparin (porcine) 1,000 unit/mL injection 7,020 Units  80 Units/kg (Adjusted) IntraVENous PRN   ??? amLODIPine (NORVASC) tablet 10 mg  10 mg Oral DAILY   ??? naloxone (NARCAN) injection 0.1 mg  0.1 mg IntraVENous PRN   ??? acetaminophen (TYLENOL) tablet 650 mg  650 mg Oral Q4H PRN    Or   ??? acetaminophen (TYLENOL) solution 650 mg  650 mg Oral Q4H PRN    Or   ??? acetaminophen (TYLENOL) suppository 650 mg  650 mg Rectal Q4H PRN   ??? HYDROcodone-acetaminophen (NORCO) 5-325 mg per tablet 1 Tab  1 Tab Oral Q4H PRN   ??? morphine injection 2 mg  2 mg IntraVENous Q4H PRN   ??? ondansetron (ZOFRAN) injection 4 mg  4 mg IntraVENous Q4H PRN   ??? hydrALAZINE (APRESOLINE) 20 mg/mL injection 20 mg  20 mg IntraVENous Q6H PRN   ??? hydrALAZINE (APRESOLINE) tablet 100 mg  100 mg Oral TID   ??? aspirin delayed-release tablet 81 mg  81 mg Oral DAILY    ??? atorvastatin (LIPITOR) tablet 80 mg  80 mg Oral QHS   ??? cloNIDine (CATAPRES) 0.3 mg/24 hr patch 1 Patch  1 Patch TransDERmal Q7D   ??? pneumococcal 13 val conj dip (PREVNAR-13) injection 0.5 mL  0.5 mL IntraMUSCular PRIOR TO DISCHARGE         Dragon medical dictation software was used for portions of this report. Unintended errors may occur.   Hazel Sams, MD  P# [phone]  November 12, 2015

## 2015-11-12 NOTE — Progress Notes (Signed)
PHYSICAL THERAPY EVALUATION:     Patient: Gregory Soto (55 y.o. male)  Room: 2202/2202    Primary Diagnosis: NSTEMI (non-ST elevated myocardial infarction) [facility])  NSTEMI (non-ST elevated myocardial infarction) [facility])          Date: 11/12/2015  Start Time:  9:20  End Time:  9:32        ASSESSMENT:     Based on the objective data described below, the patient presents with  - independent with all aspects of miblity   - good understanding of safety/precautions  - at functional baseline      No further PT indicated at this time. Will sign off. Please reconsult if pt condition changes.      Recommendations:  Out of bed to chair for meals and ambulate as tolerated, with assistance as needed.  Discharge Recommendations: Home Independent.  Further Equipment Recommendations for Discharge: No needs.     PLAN:       Planned Interventions:     Functional mobility training Patient/caregiver education.    Frequency/Duration:     Patient will be discharged after PT evaluation and pt education.  Pt has been educated and expresses understanding. All questions answered. Pt demonstrates good safety awareness and understanding of safety/precautions. No further PT needed at this time.         ** Physical Therapy order received. The role of Physical Therapy was explained to the patient. Opportunity for questions and clarification was provided. Pt. agreed to participate in PT evaluation. **  SUBJECTIVE:     Patient agreed to PT. "I want to walk. I've been in bed for 3 days. I'm tired of it."    OBJECTIVE DATA SUMMARY:     Orders, labs, and chart reviewed on [facility]. Discussed with nurse.    Recent Labs      11/10/15   0336  11/09/15   2354  11/09/15   1529   WBC  10.1  13.2*  11.2*   HGB  14.3  15.3  16.5   HCT  41.3  44.4  48.2   PLT  200  213  185       Patient was admitted to the hospital on 11/09/2015 with   Chief Complaint   Patient presents with   ??? Hypertension   ??? Abnormal Lab Results     Present illness history:    Patient Active Problem List    Diagnosis Date Noted   ??? NSTEMI (non-ST elevated myocardial infarction) (HCC) 11/09/2015      Previous medical history:   Past Medical History:   Diagnosis Date   ??? Hypertension           Prior Level of Function/Home Situation:   Information was obtained by:   patient   Home environment: Patient lives alone.  Prior level of function: Employed and independent without limitations  Home equipment: none    Patient found:   Patient found in bathroom. (+) IV pole.    Pain Assessment before PT session: Not rated  Pain Location:  L knee with bone spur - longstanding, patient under care on OP basis  Pain Assessment after PT session: None reported  Pain Location:  -    COGNITIVE STATUS:     Mental Status: Oriented x3 and pleasant.   Communication: normal.  Follows commands: intact.  General Cognition: intact .  Safety/Judgement: Good awareness of safety precautions.  Hearing: impaired.  Vision:  glasses.    EXTREMITIES ASSESSMENT:  Tone:   Normal    Sensation:  Intact to light touch, reports L thigh with tingling after positioning during the night    Proprioception:   Intact    Strength:    No deficits    Range Of Motion:  WNL      FUNCTIONAL MOBILITY AND BALANCE STATUS:     Bed mobility:     Rolling:          independent    Supine > sit:  independent    Sit > supine:  independent    Transfers:     Sit > stand: independent    Stand > sit: independent    Balance:   Static sitting balance:          good  Dynamic sitting balance:     good  Static standing balance:      good  Dynamic standing balance: good    Ambulation/Gait Training:    Distance  500 feet   Analysis  steady, WFL    Assistance  independent    DME  none        THERAPEUTIC ACTIVITIES     Activity Tolerance:   good    Activity tolerance treatment: Instructed patient on the importance of activity while hospitalized to prevent a decline in function and encouraged patient to sit up in chair for all meals or as tolerated, with  assistance as needed.    Final Location:   Patient seated in bed side chair, all needs within reach, and agrees to call for assistance as needed, about to eat breakfast, nurse present.    COMMUNICATION/EDUCATION:     Education: Ill effects of bedrest, benefits of activity while hospitalized, OOB with assist from staff, call staff for assistance, HEP, positioning, safety, DME, disposition, role of PT, PT plan of care.    Readiness to learn indicated by: interest, asking questions, trying to perform skills and verbalized understanding    Barriers to Learning/Limitations: None    Please refer to care plan and patient education section for further details.    Thank you for this referral.  Siegrune H. Dareen Piano, PT, DPT  Pager [phone]

## 2015-11-12 NOTE — Progress Notes (Incomplete)
OCCUPATIONAL THERAPY         Patient: Gregory Soto (55 y.o. male)  Room: 2202/2202    Date: 11/12/2015  Time:  ***  Primary Diagnosis: NSTEMI (non-ST elevated myocardial infarction) [facility])  NSTEMI (non-ST elevated myocardial infarction) [facility])    OBJECTIVE:  Patient was not seen for skilled occupational therapy {OT Reason for visit:21939}, secondary to {OT Reason for missed therapy:21941}.    Comments: ***    PLAN: Will follow up as schedule permits.        Jill Side, OT

## 2015-11-13 ENCOUNTER — Inpatient Hospital Stay: Admit: 2015-11-13 | Payer: BLUE CROSS/BLUE SHIELD

## 2015-11-13 ENCOUNTER — Inpatient Hospital Stay

## 2015-11-13 LAB — METABOLIC PANEL, BASIC
BUN: 34 mg/dl — ABNORMAL HIGH (ref 7–25)
CO2: 21 mEq/L (ref 21–32)
Calcium: 8.3 mg/dl — ABNORMAL LOW (ref 8.5–10.1)
Chloride: 107 mEq/L (ref 98–107)
Creatinine: 2.1 mg/dl — ABNORMAL HIGH (ref 0.6–1.3)
GFR est AA: 43
GFR est non-AA: 35
Glucose: 105 mg/dl (ref 74–106)
Potassium: 3.9 mEq/L (ref 3.5–5.1)
Sodium: 136 mEq/L (ref 136–145)

## 2015-11-13 LAB — SODIUM, UR, RANDOM: Sodium,urine random: 26 mEq/L (ref 20–110)

## 2015-11-13 LAB — CBC WITH AUTOMATED DIFF
BASOPHILS: 0.2 % (ref 0–3)
EOSINOPHILS: 1.5 % (ref 0–5)
HCT: 39.7 % (ref 37.0–50.0)
HGB: 13.4 gm/dl (ref 12.4–17.2)
IMMATURE GRANULOCYTES: 0.5 % (ref 0.0–3.0)
LYMPHOCYTES: 25.3 % — ABNORMAL LOW (ref 28–48)
MCH: 30.5 pg (ref 23.0–34.6)
MCHC: 33.8 gm/dl (ref 30.0–36.0)
MCV: 90.2 fL (ref 80.0–98.0)
MONOCYTES: 9.3 % (ref 1–13)
MPV: 11.4 fL — ABNORMAL HIGH (ref 6.0–10.0)
NEUTROPHILS: 63.2 % (ref 34–64)
NRBC: 0 (ref 0–0)
PLATELET: 163 10*3/uL (ref 140–450)
RBC: 4.4 M/uL (ref 3.80–5.70)
RDW-SD: 46.8 — ABNORMAL HIGH (ref 35.1–43.9)
WBC: 8.8 10*3/uL (ref 4.0–11.0)

## 2015-11-13 LAB — CREATININE, UR, RANDOM: Creatinine, urine random: 64.9 mg/dl (ref 30.0–125.0)

## 2015-11-13 LAB — PTT, CRRT PROTOCOL (PTT DRIP): PTT drip: 80 seconds (ref 70.0–105.0)

## 2015-11-13 LAB — PROTEIN URINE, RANDOM: Protein, urine random: 9.1 mg/dl (ref 0.0–15.0)

## 2015-11-13 MED FILL — HEPARIN (PORCINE)-0.45% NACL 25,000 UNIT/500 ML IV: 25000 unit/500 mL | INTRAVENOUS | Qty: 500

## 2015-11-13 MED FILL — HYDRALAZINE 50 MG TAB: 50 mg | ORAL | Qty: 2

## 2015-11-13 MED FILL — CARVEDILOL 25 MG TAB: 25 mg | ORAL | Qty: 1

## 2015-11-13 MED FILL — LIPITOR 40 MG TABLET: 40 mg | ORAL | Qty: 2

## 2015-11-13 MED FILL — ISOSORBIDE MONONITRATE SR 60 MG 24 HR TAB: 60 mg | ORAL | Qty: 1

## 2015-11-13 MED FILL — ASPIRIN 81 MG TAB, DELAYED RELEASE: 81 mg | ORAL | Qty: 1

## 2015-11-13 MED FILL — CYANOCOBALAMIN 1,000 MCG/ML IJ SOLN: 1000 mcg/mL | INTRAMUSCULAR | Qty: 1

## 2015-11-13 MED FILL — AMLODIPINE 5 MG TAB: 5 mg | ORAL | Qty: 2

## 2015-11-13 NOTE — Consults (Signed)
[facility]  CONSULTATION REPORT  NAME:  Gregory Soto, Gregory Soto  SEX:   M  ADMIT: 11/09/2015  DATE OF CONSULT: 11/13/2015  REFERRING PHYSICIAN:    DOB: 29-Apr-1960  MR#    [id number]  ROOM:  2202  ACCT#  [id number]    cc: Hazel Sams MD    CONSULTING PHYSICIAN:  Dr. Tasia Catchings.    HISTORY OF PRESENT ILLNESS:  Renal failure and possible need for heart catheterization  The patient is a 55 year old white male with no prior medical history who went to urgent care down in [facility] yesterday complaining of some knee pain.  Apparently, in urgent care, his blood pressure was extremely high with systolics over 200 and he was referred to the Emergency Department.  In the Emergency Department here, blood pressure was recorded as 227/108.  He was also noted to have an elevated troponin level and was admitted for further evaluation and treatment of this.    We are being asked to see him because of an elevated creatinine level.  His creatinine level is 2.1 today.  His levels have run between 1.7 and 2.1 on the several occasions that it was tested here.    He has no prior labs in the computer system.  [place]  He was here visiting his mom in [facility].  He does not see physicians on a regular basis.  He denies any prior knowledge of any kidney disease, although notes he has not had any blood work done in some time.    The patient does not take any medicine regularly.  He will use perhaps some aspirin and fish oil, but denies any nonsteroidals.    PAST MEDICAL HISTORY:  Notable for hearing loss, bilateral.    SOCIAL HISTORY:  The patient works as a Surveyor, quantity.  He is single and lives in [place].     FAMILY HISTORY:  Notable for hearing loss in his father.  No kidney disease in the family.    MEDICATIONS:  He was not taking anything prior to admission.    REVIEW OF SYSTEMS:  He denies any headache.  No acute changes in vision or hearing.  He does have poor hearing and wears hearing  aids for the last 10 years or so, bilateral.  He denies any trouble swallowing, no dysphagia, no odynophagia.  She is breathing comfortably.  No active chest pain, pressure or palpitations.  He denies any abdominal pain, no back or flank pain.  No trouble passing his urine.  Has never had gross hematuria.  He denies any swelling in the legs.  He denies any focal numbness, tingling or weakness.  He denies any acute anxiety or depression.  No major change in weight.    PHYSICAL EXAMINATION:  VITAL SIGNS:  Blood pressure 157/78, pulse 58, temperature is 97.5.  GENERAL:  Well-developed, well-nourished male.  He is sitting comfortably in a chair in no acute distress.  HEENT:  Sclerae are anicteric.  Mucous membranes are moist.  NECK:  Supple, no lymphadenopathy, JVD or goiter.  LUNGS:  Clear, without crackles or rhonchi.  CARDIOVASCULAR:  Bradycardic and regular, without murmurs, rubs or gallops.  ABDOMEN:  Soft, no organomegaly or masses.  Bowel sounds are present and normal.  GENITOURINARY:  Grossly unremarkable.  He does not have a Foley catheter.  EXTREMITIES:  He has trace to 1+ pitting edema over the shins bilaterally.  SKIN:  Shows a few varicose veins over the legs but  no rash.  NEUROLOGIC:  Nonfocal.  PSYCHIATRIC:  Within normal limits.    LABORATORY DATA:  BUN 34, creatinine 2.1.    IMPRESSION:  1.  Acute renal failure, suspect this is all chronic kidney disease secondary to unrecognized and untreated hypertension.  2.  Severe hypertension, suspect chronic, especially since severe left ventricular hypertrophy was noted on echocardiogram.  3.  Elevated troponin level, flat and mild elevation in enzymes.  This may simply be related to the severe hypertension, severe LVH as well as the renal failure.    DISCUSSION AND PLAN:  The patient was admitted for severe hypertension and this does not appear to be all acute.  The fact that he has severe LVH on his echocardiogram definitely is consistent with this  hypertension being a very longstanding problem.  I suspect this likely has been an issue for many years but because he does not see a physician on a regular basis, it has gone unrecognized and untreated.    In addition to the LVH, I think we are seeing other end organ manifestations of untreated hypertension.  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  I do not think that there is much reversibility here with respect to kidney function.  This does not appear to be a simple prerenal process by any stretch.  He is on some IV fluids, which I will leave for now, but I do not expect this is going to help the creatinine much at all.    We should get a renal ultrasound to rule out any structural or post-kidney problem.  Urinalysis was reviewed and just shows some low-grade proteinuria which would go along with hypertensive chronic kidney disease.  I am going to check a protein and creatinine ratio to grade the proteinuria.  I will also check an SPEP and free light chain to rule out a paraprotein.  Urinalysis does not show any evidence of hematuria, so this does not appear to be consistent with a glomerulonephritis.  I do not think there is any tie in between the hearing loss and the kidney disease.  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  Also, the defect in Alport syndrome is usually transmitted in an X-linked recessive manner and his deafness apparently was transmitted on the father's side.  This would not be consistent at all with the genetics involved with Alport disease.    The patient may end up needing a heart catheterization and I do not think we are going to need to wait for the creatinine to improve.  As noted above, I think this is all chronic and this is likely going to be his baseline.  If he does need a heart catheterization, there is not going to be a need to wait then for  kidney function to improve.  We will run a few tests as noted above, but I will discuss this with cardiology and will come up with a game plan regarding time of catheterization.  We can perhaps look to do this within the next couple of days.  Although there will be a risk of contrast injury, we will do our best to minimize this risk.  I certainly do not think the risk will be prohibitive in this patient.    I will also recommend checking renal artery PVL studies just given the degree of hypertension in this patient.  We will rule out renovascular  disease.  I will also check studies for pheochromocytoma as well as screen for hyperaldosteronism.  He does not have the typical electrolyte derangements associated with a  hyperaldosterone state, however.  We will do it more for completeness.    I agree with the choices in antihypertensives right now which seemed to be working.  I would hold off on ACEs or ARBs, particularly with the need for an upcoming heart catheterization.  He would likely benefit from an ACE or an ARB as an outpatient, but I would wait until things have stabilized before considering this.    Thanks again for the consult and please call for questions or concerns.      ___________________  Vernard Gambles MD  Dictated By:.   JMB  D:11/13/2015 13:02:38  T: 11/13/2015 13:43:06  [id number]

## 2015-11-13 NOTE — Consults (Signed)
Full consult dictated.    I don't think this kidney failure is acute.  I suspect this is likely chronic and irreversible from longstanding, unrecognized HTN.   Patient presented with severe HTN and the fact that severe LVH was noted on ECHO -> clearly this HTN has been going on a while, I suspect years.  Patient has NOT been following up with doctors at all on regular basis so it's easy to see how many of these illnesses (HTN, CKD, prediabetes) went undiagnosed.     RECS  - I will leave the fluids for now but I don't think they're going to help here.  This is not prerenal failure.  - we should check a renal US for completeness to rule out structural or post-kidney problem.  - I will also order renal artery PVL studies to assess for severe RAS which could explain severe nature of HTN as well as the CKD.  For completeness, screen for pheo with plasma metanephrines/catecholamines, check serum aldo/renin level in am as well.    - UA just shows low grade proteinuria which would be c/w hypertensive CKD.  Check prot/Cr ratio to quantitate.  Check SPEP and FLC to rule out paraprotein.  Lack of significant hematuria rules out nephritis.   - as above, most likely Cr won't improve much at all -> therefore, if he does require heart cath, we're not going to need to wait for Cr to improve prior to proceeding.  This is likely his baseline.   I think we can aim for cath perhaps on Thursday.  We'll do our best to protect kidneys and minimize risk of contrast injury.    Thanks, will follow with you.  Call me with any questions please.

## 2015-11-13 NOTE — Progress Notes (Signed)
Problem: Falls - Risk of  Goal: *Absence of falls  Outcome: Progressing Towards Goal  patient will remain free of falls during shift. Fall precautions in place. Will continue to monitor.

## 2015-11-13 NOTE — Other (Signed)
Bedside and Verbal shift change report given to JAvy,RN (oncoming nurse) by Romeo Apple (offgoing nurse). Report included the following information SBAR, Kardex, Recent Results and Cardiac Rhythm SB-SR.

## 2015-11-13 NOTE — Progress Notes (Signed)
[facility]   CARDIOLOGY PROGRESS NOTE  RECS:  1. With persistent and increasing azotemia consider Nephrology consult - per HM  2. Continue med mgmt for poss NSTEMI and risk factors  3. Delay cath until renal fxn improves and stable, avoid nephrotoxins    ASSESSMENT:  Gregory Soto is a 55 y.o. male with:   1. ??NSTEMI - static +TnI, LVEF=60% severe conc LVH, no signif valve dis  2. ??HTN - untreated previously, severe LVH by 2DE  3. HLD  4. AKI ? Vs CKD - creat 2.1 on presentation (unknown baseline) worsening BUN  5. Prediabetes/metabolic syndrome  6. DJD acute injury  7. Allergy - PCN      SUBJECTIVE:  No events overnight, No CP or SOB, no dizziness or bleeding     VS:   Visit Vitals   ??? BP 157/78 (BP 1 Location: Left arm, BP Patient Position: Supine)   ??? Pulse (!) 58   ??? Temp 97.5 ??F (36.4 ??C)   ??? Resp 18   ??? Ht  (1.753 m)   ??? Wt 123.5 kg (272 lb 4.3 oz)   ??? SpO2 97%   ??? BMI 40.21 kg/m2     Last 3 Recorded Weights in this Encounter    11/11/15 0739 11/12/15 0648 11/13/15 0001   Weight: 116.7 kg (257 lb 4.4 oz) 116.9 kg (257 lb 11.5 oz) 123.5 kg (272 lb 4.3 oz)       Body mass index is 40.21 kg/(m^2).     Intake/Output Summary (Last 24 hours) at 11/13/15 0946  Last data filed at 11/13/15 0432   Gross per 24 hour   Intake          2690.45 ml   Output                0 ml   Net          2690.45 ml       TELE personally reviewed by me:  NSR, VPC    EXAM:  General:  Skin warm, perfusion adequate  Neck: JVD is absent  Lungs: clear, no rales, no rhonchi   Cardiac:  Regular Rhythm, No murmur, Gallops or Rubs  Abdomen: soft, nl bowel sounds  Ext: Edema is absent  Neuro: Alert and oriented; Nonfocal      Labs:  Basic Metabolic Profile   Recent Labs      11/13/15   0409  11/12/15   0425   NA  136  137   CO2  21  22   BUN  34*  33*   K                                   3.9                                 3.8  Cr                                   2.1                                2.1  CBC w/Diff    Recent Labs      11/13/15   0409   WBC  8.8   RBC  4.40   HCT  39.7   MCV  90.2   MCH  30.5   MCHC  33.8   Hgb                                 13.4  Plts                                 163K          Cardiac Enzymes   No results for input(s): CPK, CKMB in the last 72 hours.    No lab exists for component: CKMBINDX, TROPQUANT         Coagulation   No results for input(s): INR, APTT in the last 72 hours.    No lab exists for component: PT, INREXT     Medications:  Current Facility-Administered Medications   Medication Dose Route Frequency   ??? 0.45% sodium chloride infusion  100 mL/hr IntraVENous CONTINUOUS   ??? cyanocobalamin (VITAMIN B12) injection 1,000 mcg  1,000 mcg IntraMUSCular DAILY   ??? ergocalciferol (ERGOCALCIFEROL) capsule 50,000 Units  50,000 Units Oral Q7D   ??? isosorbide mononitrate ER (IMDUR) tablet 60 mg  60 mg Oral DAILY   ??? carvedilol (COREG) tablet 25 mg  25 mg Oral BID WITH MEALS   ??? heparin (porcine) 25,000 units in 0.45% saline 500 ml infusion  0-36 Units/kg/hr (Adjusted) IntraVENous TITRATE   ??? amLODIPine (NORVASC) tablet 10 mg  10 mg Oral DAILY   ??? hydrALAZINE (APRESOLINE) tablet 100 mg  100 mg Oral TID   ??? aspirin delayed-release tablet 81 mg  81 mg Oral DAILY   ??? atorvastatin (LIPITOR) tablet 80 mg  80 mg Oral QHS   ??? cloNIDine (CATAPRES) 0.3 mg/24 hr patch 1 Patch  1 Patch TransDERmal Q7D   ??? pneumococcal 13 val conj dip (PREVNAR-13) injection 0.5 mL  0.5 mL IntraMUSCular PRIOR TO DISCHARGE       Joeanthony Seeling C. Hyacinth Meeker, MD, [facility], FSCAI  [facility], [facility]  Phone:  [phone]      11/13/2015  9:46 AM

## 2015-11-13 NOTE — Progress Notes (Signed)
Progress Note    Patient: Gregory Soto   Age:  55 y.o.  DOA: 11/09/2015   Admit Dx / CC: NSTEMI (non-ST elevated myocardial infarction) (HCC)  NSTEMI (non-ST elevated myocardial infarction) (HCC)  LOS:  LOS: 4 days     Active Problems   Active Problems:    NSTEMI (non-ST elevated myocardial infarction) (HCC) (11/09/2015)      Additional Assessments   1. Acute non-ST elevation MI  2. Hypertensive emergency  3. Deafness  4. Obesity  5. Acute kidney injury likely chronic but there is no recent blood work available  6. Left knee sprain   7. Prediabetes/metabolic syndrome  8. Vitamin D deficiency  9. B12 deficiency  Plan:   Patient be continued on current therapy, patient was started on IV fluids yesterday which didn't help his creatinine stayed around 2.1. I don't think he has acute kidney injury likely has chronic kidney disease most likely from uncontrolled hypertension  Nephrology consult was done, patient was encouraged to ambulate, further management depending on patient's progress and recommendations from nephrology regarding timing of the cardiac catheterization.  Subjective:   No new complaints today  Objective:     Visit Vitals   ??? BP 125/67 (BP 1 Location: Left arm, BP Patient Position: Sitting)   ??? Pulse (!) 52   ??? Temp 97.3 ??F (36.3 ??C)   ??? Resp 20   ??? Ht 5\' 9"  (1.753 m)   ??? Wt 123.5 kg (272 lb 4.3 oz)   ??? SpO2 98%   ??? BMI 40.21 kg/m2       Physical Exam:  General appearance: No acute distress  Lungs: CTA bilaterally  Heart: RRR, S1, S2 normal, no murmur,  Extremities: no edema, distal pulses palpable  Neurologic: Awake and alert    Intake and Output:  Current Shift:  08/01 0701 - 08/01 1900  In: 600 [P.O.:600]  Out: -   Last three shifts:  07/30 1901 - 08/01 0700  In: 3019.6 [P.O.:480; I.V.:2539.6]  Out: -     Lab/Data Reviewed:  Recent Results (from the past 48 hour(s))   METABOLIC PANEL, BASIC    Collection Time: 11/12/15  4:25 AM   Result Value Ref Range    Sodium 137 136 - 145 mEq/L     Potassium 3.8 3.5 - 5.1 mEq/L    Chloride 106 98 - 107 mEq/L    CO2 22 21 - 32 mEq/L    Glucose 105 74 - 106 mg/dl    BUN 33 (H) 7 - 25 mg/dl    Creatinine 2.1 (H) 0.6 - 1.3 mg/dl    GFR est AA 21.3      GFR est non-AA 35      Calcium 8.5 8.5 - 10.1 mg/dl   PTT, CRRT PROTOCOL (PTT DRIP)    Collection Time: 11/12/15  4:25 AM   Result Value Ref Range    PTT drip 79.0 70.0 - 105.0 seconds   CBC WITH AUTOMATED DIFF    Collection Time: 11/13/15  4:09 AM   Result Value Ref Range    WBC 8.8 4.0 - 11.0 1000/mm3    RBC 4.40 3.80 - 5.70 M/uL    HGB 13.4 12.4 - 17.2 gm/dl    HCT 08.6 57.8 - 46.9 %    MCV 90.2 80.0 - 98.0 fL    MCH 30.5 23.0 - 34.6 pg    MCHC 33.8 30.0 - 36.0 gm/dl    PLATELET 629 528 - 413 1000/mm3  MPV 11.4 (H) 6.0 - 10.0 fL    RDW-SD 46.8 (H) 35.1 - 43.9      NRBC 0 0 - 0      IMMATURE GRANULOCYTES 0.5 0.0 - 3.0 %    NEUTROPHILS 63.2 34 - 64 %    LYMPHOCYTES 25.3 (L) 28 - 48 %    MONOCYTES 9.3 1 - 13 %    EOSINOPHILS 1.5 0 - 5 %    BASOPHILS 0.2 0 - 3 %   PTT, CRRT PROTOCOL (PTT DRIP)    Collection Time: 11/13/15  4:09 AM   Result Value Ref Range    PTT drip 80.0 70.0 - 105.0 seconds   METABOLIC PANEL, BASIC    Collection Time: 11/13/15  4:09 AM   Result Value Ref Range    Sodium 136 136 - 145 mEq/L    Potassium 3.9 3.5 - 5.1 mEq/L    Chloride 107 98 - 107 mEq/L    CO2 21 21 - 32 mEq/L    Glucose 105 74 - 106 mg/dl    BUN 34 (H) 7 - 25 mg/dl    Creatinine 2.1 (H) 0.6 - 1.3 mg/dl    GFR est AA 16.1      GFR est non-AA 35      Calcium 8.3 (L) 8.5 - 10.1 mg/dl   PROTEIN URINE, RANDOM    Collection Time: 11/13/15  2:00 PM   Result Value Ref Range    Protein, urine random 9.1 0.0 - 15.0 mg/dl   CREATININE, UR, RANDOM    Collection Time: 11/13/15  2:00 PM   Result Value Ref Range    Creatinine, urine random 64.9 30.0 - 125.0 mg/dl   SODIUM, UR, RANDOM    Collection Time: 11/13/15  2:00 PM   Result Value Ref Range    Sodium urine, random 26 20 - 110 mEq/L       Imaging:  No results found.     Medications Reviewed:  Current Facility-Administered Medications   Medication Dose Route Frequency   ??? 0.45% sodium chloride infusion  100 mL/hr IntraVENous CONTINUOUS   ??? cyanocobalamin (VITAMIN B12) injection 1,000 mcg  1,000 mcg IntraMUSCular DAILY   ??? ergocalciferol (ERGOCALCIFEROL) capsule 50,000 Units  50,000 Units Oral Q7D   ??? isosorbide mononitrate ER (IMDUR) tablet 60 mg  60 mg Oral DAILY   ??? carvedilol (COREG) tablet 25 mg  25 mg Oral BID WITH MEALS   ??? heparin (porcine) 1,000 unit/mL injection 3,510 Units  40 Units/kg (Adjusted) IntraVENous PRN   ??? heparin (porcine) 25,000 units in 0.45% saline 500 ml infusion  0-36 Units/kg/hr (Adjusted) IntraVENous TITRATE   ??? heparin (porcine) 1,000 unit/mL injection 7,020 Units  80 Units/kg (Adjusted) IntraVENous PRN   ??? amLODIPine (NORVASC) tablet 10 mg  10 mg Oral DAILY   ??? naloxone (NARCAN) injection 0.1 mg  0.1 mg IntraVENous PRN   ??? acetaminophen (TYLENOL) tablet 650 mg  650 mg Oral Q4H PRN    Or   ??? acetaminophen (TYLENOL) solution 650 mg  650 mg Oral Q4H PRN    Or   ??? acetaminophen (TYLENOL) suppository 650 mg  650 mg Rectal Q4H PRN   ??? HYDROcodone-acetaminophen (NORCO) 5-325 mg per tablet 1 Tab  1 Tab Oral Q4H PRN   ??? morphine injection 2 mg  2 mg IntraVENous Q4H PRN   ??? ondansetron (ZOFRAN) injection 4 mg  4 mg IntraVENous Q4H PRN   ??? hydrALAZINE (APRESOLINE) 20 mg/mL injection 20 mg  20 mg IntraVENous Q6H PRN   ??? hydrALAZINE (APRESOLINE) tablet 100 mg  100 mg Oral TID   ??? aspirin delayed-release tablet 81 mg  81 mg Oral DAILY   ??? atorvastatin (LIPITOR) tablet 80 mg  80 mg Oral QHS   ??? cloNIDine (CATAPRES) 0.3 mg/24 hr patch 1 Patch  1 Patch TransDERmal Q7D   ??? pneumococcal 13 val conj dip (PREVNAR-13) injection 0.5 mL  0.5 mL IntraMUSCular PRIOR TO DISCHARGE         Dragon medical dictation software was used for portions of this report. Unintended errors may occur.   Hazel Sams, MD  P# [phone]  November 13, 2015

## 2015-11-13 NOTE — Consults (Signed)
[facility]  CONSULTATION REPORT  NAME:  MATHEWS, STUHR  SEX:   M  ADMIT: 11/09/2015  DATE OF CONSULT: 11/13/2015  REFERRING PHYSICIAN:    DOB: 03-23-1961  MR#    [id number]  ROOM:  [id number]  ACCT#  [id number]    cc: Hazel Sams MD    CONSULTING PHYSICIAN:  Dr. Tasia Catchings.    HISTORY OF PRESENT ILLNESS:  Renal failure and possible need for heart catheterization  The patient is a 55 year old white male with no prior medical history who went to urgent care down in [facility] yesterday complaining of some knee pain.  Apparently, in urgent care, his blood pressure was extremely high with systolics over 200 and he was referred to the Emergency Department.  In the Emergency Department here, blood pressure was recorded as 227/108.  He was also noted to have an elevated troponin level and was admitted for further evaluation and treatment of this.    We are being asked to see him because of an elevated creatinine level.  His creatinine level is 2.1 today.  His levels have run between 1.7 and 2.1 on the several occasions that it was tested here.    He has no prior labs in the computer system.  [place]  He was here visiting his mom in [facility].  He does not see physicians on a regular basis.  He denies any prior knowledge of any kidney disease, although notes he has not had any blood work done in some time.    The patient does not take any medicine regularly.  He will use perhaps some aspirin and fish oil, but denies any nonsteroidals.    PAST MEDICAL HISTORY:  Notable for hearing loss, bilateral.    SOCIAL HISTORY:  The patient works as a Surveyor, quantity.  He is single and lives in [place].     FAMILY HISTORY:  Notable for hearing loss in his father.  No kidney disease in the family.    MEDICATIONS:  He was not taking anything prior to admission.    REVIEW OF SYSTEMS:  He denies any headache.  No acute changes in vision or hearing.  He does  have poor hearing and wears hearing aids for the last 10 years or so, bilateral.  He denies any trouble swallowing, no dysphagia, no odynophagia.  She is breathing comfortably.  No active chest pain, pressure or palpitations.  He denies any abdominal pain, no back or flank pain.  No trouble passing his urine.  Has never had gross hematuria.  He denies any swelling in the legs.  He denies any focal numbness, tingling or weakness.  He denies any acute anxiety or depression.  No major change in weight.    PHYSICAL EXAMINATION:  VITAL SIGNS:  Blood pressure 157/78, pulse 58, temperature is 97.5.  GENERAL:  Well-developed, well-nourished male.  He is sitting comfortably in a chair in no acute distress.  HEENT:  Sclerae are anicteric.  Mucous membranes are moist.  NECK:  Supple, no lymphadenopathy, JVD or goiter.  LUNGS:  Clear, without crackles or rhonchi.  CARDIOVASCULAR:  Bradycardic and regular, without murmurs, rubs or gallops.  ABDOMEN:  Soft, no organomegaly or masses.  Bowel sounds are present and normal.  GENITOURINARY:  Grossly unremarkable.  He does not have a Foley catheter.  EXTREMITIES:  He has trace to 1+ pitting edema over the shins bilaterally.  SKIN:  Shows a few varicose veins over the legs but  no rash.  NEUROLOGIC:  Nonfocal.  PSYCHIATRIC:  Within normal limits.    LABORATORY DATA:  BUN 34, creatinine 2.1.    IMPRESSION:  1.  Acute renal failure, suspect this is all chronic kidney disease secondary to unrecognized and untreated hypertension.  2.  Severe hypertension, suspect chronic, especially since severe left ventricular hypertrophy was noted on echocardiogram.  3.  Elevated troponin level, flat and mild elevation in enzymes.  This may simply be related to the severe hypertension, severe LVH as well as the renal failure.    DISCUSSION AND PLAN:  The patient was admitted for severe hypertension and this does not appear to be all acute.  The fact that he has severe LVH on his echocardiogram  definitely is consistent with this hypertension being a very longstanding problem.  I suspect this likely has been an issue for many years but because he does not see a physician on a regular basis, it has gone unrecognized and untreated.    In addition to the LVH, I think we are seeing other end organ manifestations of untreated hypertension.  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  I do not think that there is much reversibility here with respect to kidney function.  This does not appear to be a simple prerenal process by any stretch.  He is on some IV fluids, which I will leave for now, but I do not expect this is going to help the creatinine much at all.    We should get a renal ultrasound to rule out any structural or post-kidney problem.  Urinalysis was reviewed and just shows some low-grade proteinuria which would go along with hypertensive chronic kidney disease.  I am going to check a protein and creatinine ratio to grade the proteinuria.  I will also check an SPEP and free light chain to rule out a paraprotein.  Urinalysis does not show any evidence of hematuria, so this does not appear to be consistent with a glomerulonephritis.  I do not think there is any tie in between the hearing loss and the kidney disease.  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  Also, the defect in Alport syndrome is usually transmitted in an X-linked recessive manner and his deafness apparently was transmitted on the father's side.  This would not be consistent at all with the genetics involved with Alport disease.    The patient may end up needing a heart catheterization and I do not think we are going to need to wait for the creatinine to improve.  As noted above, I think this is all chronic and this is likely going to be his  baseline.  If he does need a heart catheterization, there is not going to be a need to wait then for kidney function to improve.  We will run a few tests as noted above, but I will discuss this with cardiology and will come up with a game plan regarding time of catheterization.  We can perhaps look to do this within the next couple of days.  Although there will be a risk of contrast injury, we will do our best to minimize this risk.  I certainly do not think the risk will be prohibitive in this patient.    I will also recommend checking renal artery PVL studies just given the degree of hypertension in this patient.  We will rule out renovascular  disease.  I will also check studies for pheochromocytoma as well as screen for hyperaldosteronism.  He does not have the typical electrolyte derangements associated with a  hyperaldosterone state, however.  We will do it more for completeness.    I agree with the choices in antihypertensives right now which seemed to be working.  I would hold off on ACEs or ARBs, particularly with the need for an upcoming heart catheterization.  He would likely benefit from an ACE or an ARB as an outpatient, but I would wait until things have stabilized before considering this.    Thanks again for the consult and please call for questions or concerns.      ___________________  Vernard Gambles MD  Dictated By:.   JMB  D:11/13/2015 13:02:38  T: 11/13/2015 13:43:06  [id number]

## 2015-11-13 NOTE — Nurse Consult (Signed)
Transitional [facility]:    Visited with patient while sitting up in bedside chair. Patient admitted for NSTEMI and HTN emergency. Pt is from [facility] and will be returning as soon as he can per his account. Provided patient with a list of cardiologist and PCP in [place]. Pt appreciative of information.     Burke Keels RN [facility]  Nurse Navigator

## 2015-11-13 NOTE — Progress Notes (Signed)
Renal artery exam completed.  Difficult exam due to patients body habitus.  No evidence of parenchymal disease bilaterally.  Distal renal arteries are patent. Unable to visualize proximal renal arteries due to body habitus.  Final report to follow.  Francee Piccolo, RVS

## 2015-11-13 NOTE — Consults (Addendum)
Full consult dictated.    I don't think this kidney failure is acute.  I suspect this is likely chronic and irreversible from longstanding, unrecognized HTN.   Patient presented with severe HTN and the fact that severe LVH was noted on ECHO -> clearly this HTN has been going on a while, I suspect years.  Patient has NOT been following up with doctors at all on regular basis so it's easy to see how many of these illnesses (HTN, CKD, prediabetes) went undiagnosed.     RECS  - I will leave the fluids for now but I don't think they're going to help here.  This is not prerenal failure.  - we should check a renal US for completeness to rule out structural or post-kidney problem.  - I will also order renal artery PVL studies to assess for severe RAS which could explain severe nature of HTN as well as the CKD.  For completeness, screen for pheo with plasma metanephrines/catecholamines, check serum aldo/renin level in am as well.    - UA just shows low grade proteinuria which would be c/w hypertensive CKD.  Check prot/Cr ratio to quantitate.  Check SPEP and FLC to rule out paraprotein.  Lack of significant hematuria rules out nephritis.   - as above, most likely Cr won't improve much at all -> therefore, if he does require heart cath, we're not going to need to wait for Cr to improve prior to proceeding.  This is likely his baseline.   I think we can aim for cath perhaps on Thursday.  We'll do our best to protect kidneys and minimize risk of contrast injury.    Thanks, will follow with you.  Call me with any questions please.

## 2015-11-14 LAB — METABOLIC PANEL, BASIC
BUN: 32 mg/dl — ABNORMAL HIGH (ref 7–25)
CO2: 21 mEq/L (ref 21–32)
Calcium: 8.4 mg/dl — ABNORMAL LOW (ref 8.5–10.1)
Chloride: 107 mEq/L (ref 98–107)
Creatinine: 2 mg/dl — ABNORMAL HIGH (ref 0.6–1.3)
GFR est AA: 45
GFR est non-AA: 37
Glucose: 109 mg/dl — ABNORMAL HIGH (ref 74–106)
Potassium: 4.1 mEq/L (ref 3.5–5.1)
Sodium: 137 mEq/L (ref 136–145)

## 2015-11-14 LAB — PTT, CRRT PROTOCOL (PTT DRIP)
PTT drip: 69.9 seconds — CL (ref 70.0–105.0)
PTT drip: 84.6 seconds (ref 70.0–105.0)
PTT drip: 103.1 seconds (ref 70.0–105.0)

## 2015-11-14 MED ORDER — SODIUM CHLORIDE 0.9 % IV
INTRAVENOUS | Status: DC
Start: 2015-11-14 — End: 2015-11-16
  Administered 2015-11-14 – 2015-11-16 (×5): via INTRAVENOUS

## 2015-11-14 MED ORDER — ACETYLCYSTEINE 20 % (200 MG/ML) SOLN
200 mg/mL (20 %) | Freq: Two times a day (BID) | Status: AC
Start: 2015-11-14 — End: 2015-11-16
  Administered 2015-11-14 – 2015-11-16 (×3): via ORAL

## 2015-11-14 MED FILL — CARVEDILOL 25 MG TAB: 25 mg | ORAL | Qty: 1

## 2015-11-14 MED FILL — HYDRALAZINE 50 MG TAB: 50 mg | ORAL | Qty: 2

## 2015-11-14 MED FILL — CYANOCOBALAMIN 1,000 MCG/ML IJ SOLN: 1000 mcg/mL | INTRAMUSCULAR | Qty: 1

## 2015-11-14 MED FILL — ASPIRIN 81 MG TAB, DELAYED RELEASE: 81 mg | ORAL | Qty: 1

## 2015-11-14 MED FILL — LIPITOR 40 MG TABLET: 40 mg | ORAL | Qty: 2

## 2015-11-14 MED FILL — ACETYLCYSTEINE 20 % (200 MG/ML) SOLN: 200 mg/mL (20 %) | Qty: 4

## 2015-11-14 MED FILL — AMLODIPINE 5 MG TAB: 5 mg | ORAL | Qty: 2

## 2015-11-14 MED FILL — SODIUM CHLORIDE 0.9 % IV: INTRAVENOUS | Qty: 1000

## 2015-11-14 MED FILL — HEPARIN (PORCINE)-0.45% NACL 25,000 UNIT/500 ML IV: 25000 unit/500 mL | INTRAVENOUS | Qty: 500

## 2015-11-14 MED FILL — ISOSORBIDE MONONITRATE SR 60 MG 24 HR TAB: 60 mg | ORAL | Qty: 1

## 2015-11-14 MED FILL — HEPARIN (PORCINE) 1,000 UNIT/ML IJ SOLN: 1000 unit/mL | INTRAMUSCULAR | Qty: 10

## 2015-11-14 NOTE — Progress Notes (Signed)
In Patient Progress note      Admit Date: 11/09/2015          Impression:       1.  CKD3 - this kidney failure is not acute -> it is almost certainly all chronic, result of years of unrecognized (and untreated) HTN.  Patient came in with extremely high BP's and ECHO shows severe LVH.   Thus, clearly the HTN has been going on a long time and has likely led to the kidney damage we're now seeing.  Patient had not been seeing a physician on regular basis, therefore no baseline labs available.  Renal US shows no evidence of obstruction but does show echogenic kidneys with thinned cortices -> very c/w CKD.  Urine chemistries show very low grade proteinuria which would be c/w hypertensive CKD -> no evidence of GN.    2.  Elevated troponin - probable NSTEMI??  No active CP at this time.  He likely will need a heart cath to evaluate status of coronary arteries.  EF is preserved but very severe LVH noted on ECHO.    3.  HTN - unfortunately this has gone untreated likely for many years, now seeing ramifications with organ damage to heart and kidneys.  He presently is on coreg, norvasc and catapress patch -> BP's mostly running in good range.  Renal artery PVL's noted to be negative, no evidence of renovascular HTN.          Plan:     Since renal failure is chronic and Cr not expected to get significantly better -> if cath is necessary, okay to proceed at any point.  I think we can aim to do this tomorrow.  There will be risk of contrast nephropathy but risk should not be prohibitive.  We can also take precautions to minimize risk to kidneys.    Will continue fluids today and plan to run tomorrow post-cath as well -> change to isotonic fluids and we can cut rate to 75/hr at this point.    Mucomyst 600mg  bid x 4 doses.    Continue present anti-HTN meds -> I think ACE/ARB would be beneficial for patient later, but would hold on starting this especially with upcoming cath.     Secondary HTN w/u (pheo, renin/aldo, etc) sent and remain pending.  Expect these will all be negative but best to check for completeness.  I doubt these will be back, however, by time he's ready for d/c.      Following.  Call me with any questions please.            Subjective:     No complaints.             Current Facility-Administered Medications:   ???  0.45% sodium chloride infusion, 100 mL/hr, IntraVENous, CONTINUOUS, Hazel Sams, MD, Last Rate: 100 mL/hr at 11/14/15 0340, 100 mL/hr at 11/14/15 0340  ???  cyanocobalamin (VITAMIN B12) injection 1,000 mcg, 1,000 mcg, IntraMUSCular, DAILY, Hazel Sams, MD, 1,000 mcg at 11/14/15 0955  ???  ergocalciferol (ERGOCALCIFEROL) capsule 50,000 Units, 50,000 Units, Oral, Q7D, Hazel Sams, MD, 50,000 Units at 11/10/15 0816  ???  isosorbide mononitrate ER (IMDUR) tablet 60 mg, 60 mg, Oral, DAILY, Hazel Sams, MD, 60 mg at 11/14/15 4917  ???  carvedilol (COREG) tablet 25 mg, 25 mg, Oral, BID WITH MEALS, Hazel Sams, MD, 25 mg at 11/14/15 9150  ???  heparin (porcine) 1,000 unit/mL injection 3,510 Units, 40 Units/kg (Adjusted), IntraVENous, PRN, Smitty Cords, MD,  3,510 Units at 11/14/15 0433  ???  heparin (porcine) 25,000 units in 0.45% saline 500 ml infusion, 0-36 Units/kg/hr (Adjusted), IntraVENous, TITRATE, Smitty Cords, MD, Last Rate: 29.9 mL/hr at 11/14/15 0708, 17 Units/kg/hr at 11/14/15 0708  ???  heparin (porcine) 1,000 unit/mL injection 7,020 Units, 80 Units/kg (Adjusted), IntraVENous, PRN, Smitty Cords, MD  ???  amLODIPine (NORVASC) tablet 10 mg, 10 mg, Oral, DAILY, Hazel Sams, MD, 10 mg at 11/14/15 5409  ???  naloxone [facility]) injection 0.1 mg, 0.1 mg, IntraVENous, PRN, Hazel Sams, MD  ???  acetaminophen (TYLENOL) tablet 650 mg, 650 mg, Oral, Q4H PRN **OR** acetaminophen (TYLENOL) solution 650 mg, 650 mg, Oral, Q4H PRN **OR** acetaminophen (TYLENOL) suppository 650 mg, 650 mg, Rectal, Q4H PRN, Hazel Sams, MD  ???  HYDROcodone-acetaminophen (NORCO) 5-325 mg per tablet 1 Tab, 1 Tab,  Oral, Q4H PRN, Hazel Sams, MD  ???  morphine injection 2 mg, 2 mg, IntraVENous, Q4H PRN, Hazel Sams, MD  ???  ondansetron (ZOFRAN) injection 4 mg, 4 mg, IntraVENous, Q4H PRN, Hazel Sams, MD  ???  hydrALAZINE (APRESOLINE) 20 mg/mL injection 20 mg, 20 mg, IntraVENous, Q6H PRN, Hazel Sams, MD, 20 mg at 11/09/15 1818  ???  hydrALAZINE (APRESOLINE) tablet 100 mg, 100 mg, Oral, TID, Hazel Sams, MD, 100 mg at 11/14/15 0954  ???  aspirin delayed-release tablet 81 mg, 81 mg, Oral, DAILY, Hazel Sams, MD, 81 mg at 11/14/15 0954  ???  atorvastatin (LIPITOR) tablet 80 mg, 80 mg, Oral, QHS, Hazel Sams, MD, 80 mg at 11/13/15 2115  ???  cloNIDine (CATAPRES) 0.3 mg/24 hr patch 1 Patch, 1 Patch, TransDERmal, Q7D, Hazel Sams, MD, 1 Patch at 11/09/15 1841  ???  pneumococcal 13 val conj dip (PREVNAR-13) injection 0.5 mL, 0.5 mL, IntraMUSCular, PRIOR TO DISCHARGE, Hazel Sams, MD        Objective:     Visit Vitals   ??? BP 164/83 (BP 1 Location: Left arm, BP Patient Position: Sitting)   ??? Pulse (!) 58   ??? Temp 97.9 ??F (36.6 ??C)   ??? Resp 16   ??? Ht  (1.753 m)   ??? Wt 127 kg (279 lb 15.8 oz)   ??? SpO2 97%   ??? BMI 41.35 kg/m2         Intake/Output Summary (Last 24 hours) at 11/14/15 1019  Last data filed at 11/14/15 0705   Gross per 24 hour   Intake          4075.47 ml   Output                0 ml   Net          4075.47 ml       Physical Exam:     Awake, NAD.  Clear lungs.  RRR.  Trace-1+ edema shins.  + varicose veins legs.  Abdomen obese, ND/NT +BS.        Data Review:    Recent Labs      11/13/15   0409   WBC  8.8   RBC  4.40   HCT  39.7   MCV  90.2   MCH  30.5   MCHC  33.8     Recent Labs      11/14/15   0329  11/13/15   0409  11/12/15   0425   BUN  32*  34*  33*   CREA  2.0*  2.1*  2.1*   CA  8.4*  8.3*  8.5  K  4.1  3.9  3.8   NA  137  136  137   CL  107  107  106   CO2  21  21  22    GLU  109*  105  105       Vernard Gambles, MD  Cell [phone]  Pager [phone]

## 2015-11-14 NOTE — Other (Signed)
Bedside shift change report given to Temitope A. Maisie Fus, RN (Cabin crew) by Marian Sorrow RN (offgoing nurse). Report included the following information SBAR, Kardex and Cardiac Rhythm SB/SR.

## 2015-11-14 NOTE — Other (Signed)
Bedside and Verbal shift change report given to Olga RN (oncoming nurse) by Javy RN (offgoing nurse). Report included the following information SBAR, Kardex, MAR and Recent Results.

## 2015-11-14 NOTE — Progress Notes (Signed)
Progress Note    Patient: Gregory Soto   Age:  55 y.o.  DOA: 11/09/2015   Admit Dx / CC: NSTEMI (non-ST elevated myocardial infarction) (HCC)  NSTEMI (non-ST elevated myocardial infarction) (HCC)  LOS:  LOS: 5 days     Active Problems   Active Problems:    NSTEMI (non-ST elevated myocardial infarction) (HCC) (11/09/2015)      Additional Assessments   1. Acute non-ST elevation MI  2. Hypertensive emergency  3. Deafness  4. Obesity  5. Acute kidney injury likely chronic but there is no recent blood work available  6. Left knee sprain   7. Prediabetes/metabolic syndrome  8. Vitamin D deficiency  9. B12 deficiency  Plan:   Patient be continued on current therapy, patient is scheduled for cardiac catheter is in the morning, patient be continued on IV fluids and IV heparin for at least 24 more hours. Further management depending on patient's progress and results of the cardiac catheterization. Patient was advised not to eat and drink anything after midnight  Subjective:   No new complaints today  Objective:     Visit Vitals   ??? BP 120/66 (BP 1 Location: Left arm, BP Patient Position: Sitting)   ??? Pulse (!) 56   ??? Temp 98.3 ??F (36.8 ??C)   ??? Resp 16   ??? Ht  (1.753 m)   ??? Wt 127 kg (279 lb 15.8 oz)   ??? SpO2 97%   ??? BMI 41.35 kg/m2       Physical Exam:  General appearance: No acute distress sitting comfortably  Lungs: CTA bilaterally  Heart: RRR, S1, S2 normal, no murmur,  Extremities: Trace edema  Neurologic: Awake and alert    Intake and Output:  Current Shift:  08/02 0701 - 08/02 1900  In: 1522.1 [I.V.:1522.1]  Out: -   Last three shifts:  07/31 1901 - 08/02 0700  In: 4241.7 [P.O.:1080; I.V.:3161.7]  Out: -     Lab/Data Reviewed:  Recent Results (from the past 48 hour(s))   CBC WITH AUTOMATED DIFF    Collection Time: 11/13/15  4:09 AM   Result Value Ref Range    WBC 8.8 4.0 - 11.0 1000/mm3    RBC 4.40 3.80 - 5.70 M/uL    HGB 13.4 12.4 - 17.2 gm/dl    HCT 04.5 40.9 - 81.1 %    MCV 90.2 80.0 - 98.0 fL     MCH 30.5 23.0 - 34.6 pg    MCHC 33.8 30.0 - 36.0 gm/dl    PLATELET 914 782 - 956 1000/mm3    MPV 11.4 (H) 6.0 - 10.0 fL    RDW-SD 46.8 (H) 35.1 - 43.9      NRBC 0 0 - 0      IMMATURE GRANULOCYTES 0.5 0.0 - 3.0 %    NEUTROPHILS 63.2 34 - 64 %    LYMPHOCYTES 25.3 (L) 28 - 48 %    MONOCYTES 9.3 1 - 13 %    EOSINOPHILS 1.5 0 - 5 %    BASOPHILS 0.2 0 - 3 %   PTT, CRRT PROTOCOL (PTT DRIP)    Collection Time: 11/13/15  4:09 AM   Result Value Ref Range    PTT drip 80.0 70.0 - 105.0 seconds   METABOLIC PANEL, BASIC    Collection Time: 11/13/15  4:09 AM   Result Value Ref Range    Sodium 136 136 - 145 mEq/L    Potassium 3.9 3.5 - 5.1 mEq/L  Chloride 107 98 - 107 mEq/L    CO2 21 21 - 32 mEq/L    Glucose 105 74 - 106 mg/dl    BUN 34 (H) 7 - 25 mg/dl    Creatinine 2.1 (H) 0.6 - 1.3 mg/dl    GFR est AA 16.1      GFR est non-AA 35      Calcium 8.3 (L) 8.5 - 10.1 mg/dl   PROTEIN URINE, RANDOM    Collection Time: 11/13/15  2:00 PM   Result Value Ref Range    Protein, urine random 9.1 0.0 - 15.0 mg/dl   CREATININE, UR, RANDOM    Collection Time: 11/13/15  2:00 PM   Result Value Ref Range    Creatinine, urine random 64.9 30.0 - 125.0 mg/dl   SODIUM, UR, RANDOM    Collection Time: 11/13/15  2:00 PM   Result Value Ref Range    Sodium urine, random 26 20 - 110 mEq/L   METABOLIC PANEL, BASIC    Collection Time: 11/14/15  3:29 AM   Result Value Ref Range    Sodium 137 136 - 145 mEq/L    Potassium 4.1 3.5 - 5.1 mEq/L    Chloride 107 98 - 107 mEq/L    CO2 21 21 - 32 mEq/L    Glucose 109 (H) 74 - 106 mg/dl    BUN 32 (H) 7 - 25 mg/dl    Creatinine 2.0 (H) 0.6 - 1.3 mg/dl    GFR est AA 09.6      GFR est non-AA 37      Calcium 8.4 (L) 8.5 - 10.1 mg/dl   PTT, CRRT PROTOCOL (PTT DRIP)    Collection Time: 11/14/15  3:29 AM   Result Value Ref Range    PTT drip 69.9 (LL) 70.0 - 105.0 seconds   PTT, CRRT PROTOCOL (PTT DRIP)    Collection Time: 11/14/15 10:04 AM   Result Value Ref Range    PTT drip 84.6 70.0 - 105.0 seconds       Imaging:   [place] Retroperitoneum Comp    Result Date: 11/13/2015  Indication: Chronic renal failure Sonographic evaluation of the kidney shows normal size bilaterally at 11.1 cm on the right and 13.1 cm on the left. Both kidneys are mildly increased in echogenicity with cortical thinning. No hydronephrosis. Bladder unremarkable. Prostate normal size.     IMPRESSION: Medical renal disease.     Duplex Abd Visc Art/ven/organs Complete    Result Date: 11/13/2015                                                               Study ID: [id number]                                                   [facility]                                                   [address] [employer]  [place], [place] [address]                              Renal Duplex Report Name: Gregory Soto Gregory Soto             te: 11/13/2015 02:12 PM MRN: [id number]                     Patient Location: [id number] DOB: 06-17-1960                 Age: 27 yrs Gender: Male                    Account #: [id number] Reason For Study: Resistant hypertension Ordering Physician: Gregory Soto Performed By: Francee Piccolo Interpretation Summary No evidence of renal artery stenosis distally (proximal and mid renal arteries not visualized.) Patent bilateral renal veins, no intraluminal thrombus identified. No evidence of significant renal parenchymal disease. Kidney size symmetrical and normal. _____________________________________________________________________________ _ QUALITY/PROCEDURE Renal Artery Duplex [id number]. Quality of the study is techinically limited. ICD 10 401.9. HISTORY/SYMPTOMS Morbid obesity. Hyperlipidemia. Uncontrolled hypertension. CAD. Resistant HTN. RIGHT RENAL Distal RRA = 61 cm/sec. Hilar 48 cm/sec. Upper Pole 16/7 cm/sec. Upper RI = .58. Middle pole = 17/6 cm/sec. Middle RI = .66. Lower pole = 13/6 cm/sec. Lower RI = .59. Renal-aortic Ratio = .65. Kidney Length = 11.6 cm.  Kidney Width = 6.2 cm. RIGHT RENAL FINDINGS There is no evidence of hemodynamically significant right distal renal artery stenosis. Patent right renal vein, no intraluminal thrombus identified. Right renovascular resistance is normal. The resistance index in the right kidney is within the expected range. Right end diastolic ratio is 0.66. Proximal and mid renal arteries not seen due to patients body habitus. LEFT RENAL Distal LRA = 70 cm/sec. Hilar 61 cm/sec. Upper pole = 21/6 cm/sec. Upper RI = .69. Middle pole = 19/6 cm/sec. Middle RI = .68. Lower pole = 17/6 cm/sec. Lower RI = .67. Renal-aortic Ratio = .74. Kidney Length = 11.2 cm. Kidney Width = 5.3 cm. LEFT RENAL FINDINGS There is no evidence of hemodynamically significant left distal renal artery stenosis. Patent left renal veins, no intraluminal thrombus identified. Left renovascular resistance is normal. The resistance index in the left kidney is within the expected range. Left end diastolic ratio is 0.69. The proximal and mid renal arteries not seen due to patients body habitus. Electronically signed byDr. Norberto Sorenson, MD   11/13/2015 03:56 PM       Medications Reviewed:  Current Facility-Administered Medications   Medication Dose Route Frequency   ??? 0.9% sodium chloride infusion  75 mL/hr IntraVENous CONTINUOUS   ??? acetylcysteine (MUCOMYST) 200 mg/mL (20 %) solution 600 mg  600 mg Oral BID   ??? cyanocobalamin (VITAMIN B12) injection 1,000 mcg  1,000 mcg IntraMUSCular DAILY   ??? ergocalciferol (ERGOCALCIFEROL) capsule 50,000 Units  50,000 Units Oral Q7D   ??? isosorbide mononitrate ER (IMDUR) tablet 60 mg  60 mg Oral DAILY   ??? carvedilol (COREG) tablet 25 mg  25 mg Oral BID WITH MEALS   ??? heparin (porcine) 1,000 unit/mL injection 3,510 Units  40 Units/kg (Adjusted) IntraVENous PRN   ??? heparin (porcine) 25,000 units in 0.45% saline 500 ml infusion  0-36 Units/kg/hr (Adjusted) IntraVENous TITRATE    ??? heparin (porcine) 1,000 unit/mL injection 7,020 Units  80 Units/kg (Adjusted) IntraVENous PRN   ??? amLODIPine (NORVASC) tablet 10 mg  10 mg Oral  DAILY   ??? naloxone (NARCAN) injection 0.1 mg  0.1 mg IntraVENous PRN   ??? acetaminophen (TYLENOL) tablet 650 mg  650 mg Oral Q4H PRN    Or   ??? acetaminophen (TYLENOL) solution 650 mg  650 mg Oral Q4H PRN    Or   ??? acetaminophen (TYLENOL) suppository 650 mg  650 mg Rectal Q4H PRN   ??? HYDROcodone-acetaminophen (NORCO) 5-325 mg per tablet 1 Tab  1 Tab Oral Q4H PRN   ??? morphine injection 2 mg  2 mg IntraVENous Q4H PRN   ??? ondansetron (ZOFRAN) injection 4 mg  4 mg IntraVENous Q4H PRN   ??? hydrALAZINE (APRESOLINE) 20 mg/mL injection 20 mg  20 mg IntraVENous Q6H PRN   ??? hydrALAZINE (APRESOLINE) tablet 100 mg  100 mg Oral TID   ??? aspirin delayed-release tablet 81 mg  81 mg Oral DAILY   ??? atorvastatin (LIPITOR) tablet 80 mg  80 mg Oral QHS   ??? cloNIDine (CATAPRES) 0.3 mg/24 hr patch 1 Patch  1 Patch TransDERmal Q7D   ??? pneumococcal 13 val conj dip (PREVNAR-13) injection 0.5 mL  0.5 mL IntraMUSCular PRIOR TO DISCHARGE         Dragon medical dictation software was used for portions of this report. Unintended errors may occur.   Hazel Sams, MD  P# [phone]  November 14, 2015

## 2015-11-14 NOTE — Progress Notes (Signed)
[facility]    Patient: Gregory Soto Age: 55 y.o. Sex: male    Date of Birth: [birth year] Admit Date: 11/09/2015    MRN: [id number]  CSN: [id number]       CARDIOLOGY PROGRESS NOTE  RECS:  1. HTN heart disease, with HTN urgency and + troponin, no angina.  Normal EF by Echo.  IVFs, caution with diastolic dysfunction and volume overload.  2. Iv heparin until cath tomorrow; renal precautions noted.  Will limit contrast.    ASSESSMENT:  Gregory Soto is a 55 y.o. male with:    1. ??NSTEMI - static +TnI, LVEF=60% severe conc LVH, no signif valve dis  2. ??HTN - untreated previously, severe LVH by 2DE  3. HLD  4. AKI ? Vs CKD - creat 2.1 on presentation (unknown baseline) worsening BUN  5. Prediabetes/metabolic syndrome  6. DJD acute injury  7. Allergy - PCN      Hospital Problems  Never Reviewed          Codes Class Noted POA    NSTEMI (non-ST elevated myocardial infarction) [facility]) ICD-10-CM: I21.4  ICD-9-CM: 410.70  11/09/2015 Unknown              SUBJECTIVE:  No CP or SOB now.  mild dyspnea last PM     VS:   Visit Vitals   ??? BP 120/66 (BP 1 Location: Left arm, BP Patient Position: Sitting)   ??? Pulse (!) 56   ??? Temp 98.3 ??F (36.8 ??C)   ??? Resp 16   ??? Ht 5\' 9"  (1.753 m)   ??? Wt 127 kg (279 lb 15.8 oz)   ??? SpO2 97%   ??? BMI 41.35 kg/m2       Intake/Output Summary (Last 24 hours) at 11/14/15 1248  Last data filed at 11/14/15 0705   Gross per 24 hour   Intake          4075.47 ml   Output                0 ml   Net          4075.47 ml     TELE: nsr    HEENT: [place], Moist mucosal membranes; Conjunctiva not injected    Neck: No JVD, Negative carotid bruits    Resp: CTA bilaterally; No wheezes or rales    CV: RRR s1s2 No murmur; No rubs    Abd: Positive Bowel Sounds, Soft, Nontender, No hepatosplenomegaly; no bruits    Ext: No clubbing; No cyanosis; 1+ edema    Neuro: Alert and oriented; Nonfocal    Skin: Warm, Dry, Intact    Pulses: 2+ DP/PT/Rad    Neuro: no weakness    Psych: no mood changes       Review of Systems:   No Malaise, Palpitations, cough, visual changes, rash, muscle weakness, dysuria, change in bowel habits, tinnitus, mood change, or visual changes.  All others negative.    Labs:  Basic Metabolic Profile     Lab Results   Component Value Date    NA 137 11/14/2015    K 4.1 11/14/2015    CL 107 11/14/2015    CO2 21 11/14/2015    BUN 32 (H) 11/14/2015    CREA 2.0 (H) 11/14/2015    GLU 109 (H) 11/14/2015    CA 8.4 (L) 11/14/2015    MG 2.0 11/10/2015    PHOS 3.5 11/10/2015     GFR: Estimated Creatinine  Clearance: 57 mL/min (based on Cr of 2).      CBC w/Diff    Recent Labs      11/13/15   0409   WBC  8.8   RBC  4.40   HCT  39.7   MCV  90.2   MCH  30.5   MCHC  33.8    Recent Labs      11/13/15   0409   MONOS  9.3   EOS  1.5   BASOS  0.2        Cardiac Enzymes   No results for input(s): CPK, CKMB in the last 72 hours.    No lab exists for component: CKMBINDX, TROPQUANT     Coagulation   No results for input(s): INR, APTT in the last 72 hours.    No lab exists for component: PT, INREXT     Medications:  Current Facility-Administered Medications   Medication Dose Route Frequency   ??? 0.9% sodium chloride infusion  75 mL/hr IntraVENous CONTINUOUS   ??? acetylcysteine (MUCOMYST) 200 mg/mL (20 %) solution 600 mg  600 mg Oral BID   ??? cyanocobalamin (VITAMIN B12) injection 1,000 mcg  1,000 mcg IntraMUSCular DAILY   ??? ergocalciferol (ERGOCALCIFEROL) capsule 50,000 Units  50,000 Units Oral Q7D   ??? isosorbide mononitrate ER (IMDUR) tablet 60 mg  60 mg Oral DAILY   ??? carvedilol (COREG) tablet 25 mg  25 mg Oral BID WITH MEALS   ??? heparin (porcine) 25,000 units in 0.45% saline 500 ml infusion  0-36 Units/kg/hr (Adjusted) IntraVENous TITRATE   ??? amLODIPine (NORVASC) tablet 10 mg  10 mg Oral DAILY   ??? hydrALAZINE (APRESOLINE) tablet 100 mg  100 mg Oral TID   ??? aspirin delayed-release tablet 81 mg  81 mg Oral DAILY   ??? atorvastatin (LIPITOR) tablet 80 mg  80 mg Oral QHS    ??? cloNIDine (CATAPRES) 0.3 mg/24 hr patch 1 Patch  1 Patch TransDERmal Q7D   ??? pneumococcal 13 val conj dip (PREVNAR-13) injection 0.5 mL  0.5 mL IntraMUSCular PRIOR TO DISCHARGE       Time spent @ bedside and reviewing chart and/or discussion with family and involved medical professionals:  >30 minutes    Roque Cash, MD, [facility], FSCAI  [phone] pager  [phone] office (please use after hours)

## 2015-11-15 ENCOUNTER — Encounter

## 2015-11-15 LAB — RENAL FUNCTION PANEL
Albumin: 3.1 gm/dl — ABNORMAL LOW (ref 3.4–5.0)
BUN: 32 mg/dl — ABNORMAL HIGH (ref 7–25)
CO2: 20 mEq/L — ABNORMAL LOW (ref 21–32)
Calcium: 8.5 mg/dl (ref 8.5–10.1)
Chloride: 109 mEq/L — ABNORMAL HIGH (ref 98–107)
Creatinine: 2.1 mg/dl — ABNORMAL HIGH (ref 0.6–1.3)
GFR est AA: 43
GFR est non-AA: 35
Glucose: 102 mg/dl (ref 74–106)
Phosphorus: 3.8 mg/dl (ref 2.5–4.9)
Potassium: 4.2 mEq/L (ref 3.5–5.1)
Sodium: 137 mEq/L (ref 136–145)

## 2015-11-15 LAB — POC PT/INR
INR: 1.2 — ABNORMAL HIGH (ref 0.0–1.1)
Prothrombin time: 14.2 seconds — ABNORMAL HIGH (ref 0.0–14.0)

## 2015-11-15 LAB — PTT, CRRT PROTOCOL (PTT DRIP)
PTT drip: 123.8 seconds — CR (ref 70.0–105.0)
PTT drip: 34.8 seconds — CL (ref 70.0–105.0)

## 2015-11-15 LAB — POC ACTIVATED CLOTTING TIME: Activated clotting time (POC): 156 seconds — ABNORMAL HIGH (ref 79–149)

## 2015-11-15 MED ORDER — SODIUM CHLORIDE 0.9 % IJ SYRG
Freq: Three times a day (TID) | INTRAMUSCULAR | Status: DC
Start: 2015-11-15 — End: 2015-11-16
  Administered 2015-11-15 – 2015-11-16 (×3): via INTRAVENOUS

## 2015-11-15 MED ORDER — NITROGLYCERIN 0.5 MG/ 10 ML (50 MCG/ML COMPOUNDED INJECTION)
0.5 mg/ 10mL | INTRAVENOUS | Status: DC | PRN
Start: 2015-11-15 — End: 2015-11-15
  Administered 2015-11-15: 15:00:00 via INTRACORONARY

## 2015-11-15 MED ORDER — LABETALOL 5 MG/ML IV SOLN
5 mg/mL | INTRAVENOUS | Status: DC | PRN
Start: 2015-11-15 — End: 2015-11-16

## 2015-11-15 MED ORDER — MORPHINE 2 MG/ML INJECTION
2 mg/mL | INTRAMUSCULAR | Status: AC | PRN
Start: 2015-11-15 — End: 2015-11-16

## 2015-11-15 MED ORDER — HEPARIN (PORCINE) IN NS (PF) 1,000 UNIT/500 ML IV
1000 unit/500 mL | INTRAVENOUS | Status: DC | PRN
Start: 2015-11-15 — End: 2015-11-15
  Administered 2015-11-15: 15:00:00

## 2015-11-15 MED ORDER — VERAPAMIL 2.5 MG/ML IV
2.5 mg/mL | INTRAVENOUS | Status: DC | PRN
Start: 2015-11-15 — End: 2015-11-15
  Administered 2015-11-15: 15:00:00 via INTRA_ARTERIAL

## 2015-11-15 MED ORDER — HEPARIN (PORCINE) 5,000 UNIT/ML IJ SOLN
5000 unit/mL | Freq: Two times a day (BID) | INTRAMUSCULAR | Status: DC
Start: 2015-11-15 — End: 2015-11-15
  Administered 2015-11-15: 16:00:00 via SUBCUTANEOUS

## 2015-11-15 MED ORDER — FENTANYL CITRATE (PF) 50 MCG/ML IJ SOLN
50 mcg/mL | INTRAMUSCULAR | Status: DC | PRN
Start: 2015-11-15 — End: 2015-11-15
  Administered 2015-11-15: 15:00:00 via INTRAVENOUS

## 2015-11-15 MED ORDER — HEPARIN (PORCINE) 5,000 UNIT/ML IJ SOLN
5000 unit/mL | Freq: Three times a day (TID) | INTRAMUSCULAR | Status: DC
Start: 2015-11-15 — End: 2015-11-16
  Administered 2015-11-15 – 2015-11-16 (×3): via SUBCUTANEOUS

## 2015-11-15 MED ORDER — IOPAMIDOL 61 % IV SOLN
300 mg iodine /mL (61 %) | INTRAVENOUS | Status: DC | PRN
Start: 2015-11-15 — End: 2015-11-15
  Administered 2015-11-15: 15:00:00 via INTRAVENOUS

## 2015-11-15 MED ORDER — SODIUM CHLORIDE 0.9 % IJ SYRG
INTRAMUSCULAR | Status: DC | PRN
Start: 2015-11-15 — End: 2015-11-16

## 2015-11-15 MED ORDER — HEPARIN (PORCINE) 1,000 UNIT/ML IJ SOLN
1000 unit/mL | INTRAMUSCULAR | Status: DC | PRN
Start: 2015-11-15 — End: 2015-11-15
  Administered 2015-11-15: 15:00:00 via INTRAVENOUS

## 2015-11-15 MED ORDER — SODIUM CHLORIDE 0.9 % IV
INTRAVENOUS | Status: DC | PRN
Start: 2015-11-15 — End: 2015-11-15
  Administered 2015-11-15: 15:00:00 via INTRAVENOUS

## 2015-11-15 MED ORDER — MIDAZOLAM 1 MG/ML IJ SOLN
1 mg/mL | Freq: Once | INTRAMUSCULAR | Status: AC
Start: 2015-11-15 — End: 2015-11-15
  Administered 2015-11-15: 15:00:00 via INTRAVENOUS

## 2015-11-15 MED ORDER — HYDRALAZINE 20 MG/ML IJ SOLN
20 mg/mL | INTRAMUSCULAR | Status: DC | PRN
Start: 2015-11-15 — End: 2015-11-16

## 2015-11-15 MED FILL — HEPARIN (PORCINE) 5,000 UNIT/ML IJ SOLN: 5000 unit/mL | INTRAMUSCULAR | Qty: 1

## 2015-11-15 MED FILL — HEPARIN (PORCINE) 1,000 UNIT/ML IJ SOLN: 1000 unit/mL | INTRAMUSCULAR | Qty: 10

## 2015-11-15 MED FILL — HEPARIN (PORCINE) IN NS (PF) 1,000 UNIT/500 ML IV: 1000 unit/500 mL | INTRAVENOUS | Qty: 500

## 2015-11-15 MED FILL — NITROGLYCERIN 0.5 MG/ 10 ML (50 MCG/ML COMPOUNDED INJECTION): 0.5 mg/ 10mL | INTRAVENOUS | Qty: 20

## 2015-11-15 MED FILL — CARVEDILOL 25 MG TAB: 25 mg | ORAL | Qty: 1

## 2015-11-15 MED FILL — MIDAZOLAM 1 MG/ML IJ SOLN: 1 mg/mL | INTRAMUSCULAR | Qty: 2

## 2015-11-15 MED FILL — SODIUM CHLORIDE 0.9 % IV: INTRAVENOUS | Qty: 1000

## 2015-11-15 MED FILL — CYANOCOBALAMIN 1,000 MCG/ML IJ SOLN: 1000 mcg/mL | INTRAMUSCULAR | Qty: 1

## 2015-11-15 MED FILL — HYDRALAZINE 50 MG TAB: 50 mg | ORAL | Qty: 2

## 2015-11-15 MED FILL — ACETYLCYSTEINE 20 % (200 MG/ML) SOLN: 200 mg/mL (20 %) | Qty: 4

## 2015-11-15 MED FILL — HEPARIN (PORCINE)-0.45% NACL 25,000 UNIT/500 ML IV: 25000 unit/500 mL | INTRAVENOUS | Qty: 500

## 2015-11-15 MED FILL — VITAMIN D2 1,250 MCG (50,000 UNIT) CAPSULE: 1250 mcg (50,000 unit) | ORAL | Qty: 1

## 2015-11-15 MED FILL — LIPITOR 40 MG TABLET: 40 mg | ORAL | Qty: 2

## 2015-11-15 MED FILL — FENTANYL CITRATE (PF) 50 MCG/ML IJ SOLN: 50 mcg/mL | INTRAMUSCULAR | Qty: 2

## 2015-11-15 MED FILL — LABETALOL 5 MG/ML IV SOLN: 5 mg/mL | INTRAVENOUS | Qty: 20

## 2015-11-15 MED FILL — AMLODIPINE 5 MG TAB: 5 mg | ORAL | Qty: 2

## 2015-11-15 MED FILL — VERAPAMIL 2.5 MG/ML IV: 2.5 mg/mL | INTRAVENOUS | Qty: 2

## 2015-11-15 MED FILL — ISOSORBIDE MONONITRATE SR 60 MG 24 HR TAB: 60 mg | ORAL | Qty: 1

## 2015-11-15 MED FILL — ASPIRIN 81 MG TAB, DELAYED RELEASE: 81 mg | ORAL | Qty: 1

## 2015-11-15 MED FILL — ISOVUE-300  61 % INTRAVENOUS SOLUTION: 300 mg iodine /mL (61 %) | INTRAVENOUS | Qty: 200

## 2015-11-15 NOTE — Procedures (Signed)
[facility]  Cardiac Catheterization  NAME:  Gregory Soto, Gregory Soto  CATH PHYSICIAN: Cyndia Diver MD  REF PHYSICIAN:   SEX:   M  DATE: 11/15/2015  DOB: 1960-11-19  LOCATION: CPACU  CATH #:   MR#    [id number]  ACCT#  [id number]        PROCEDURES:  1.  Conscious sedation administration on monitoring between 10:48 a.m. and 11 a.m. with 1 mg Versed and 50 mcg of fentanyl.  2.  Right radial artery access.  3.  Left heart catheterization under fluoroscopy.  4.  Selective right and left coronary angiography.    CATHETER AND MEDICATIONS:  Please see accompanying form for details.    COMPLICATIONS:  None.    ESTIMATED BLOOD LOSS:  Minimal.    SAMPLES:  No sample was removed.    OPERATOR:  Cyndia Diver, MD    ASSISTANT:  Per MAC lab report, cath lab staff.    COMPLICATIONS:  None.    CONTRAST LOAD:  25 mL of Visipaque equivalent to patient.    PREPROCEDURE DIAGNOSES:  1.  Troponin elevation due to non-ST-elevation myocardial infarction versus demand ischemia from malignant hypertension.  2.  Malignant hypertension, previously untreated.  3.  Severe left ventricular hypertrophy.  4.  Dyslipidemia.  5.  Chronic kidney disease, baseline creatinine 2.1.  6.  Prediabetes with metabolic syndrome.  7.  Degenerative joint disease.  8.  ALLERGY TO PENICILLIN.  9.  Elevated body mass index to 41.     FINDINGS     HEMODYNAMICS:  Aortic pressure was 120/73, left ventricular pressure was 120/24, left ventricular end-diastolic pressure was 24.    LEFT VENTRICULOGRAM:  Left ventriculography was not performed due to the patient's chronic kidney disease.  Left ventricular systolic function is known to be normal with ejection fraction 60% by echocardiography with severe left ventricular hypertrophy.    CORONARY ANGIOGRAPHY:  Left main coronary artery is large and normal.  The left anterior descending artery is large and wraps around the apex and has mild luminal irregularities.  There is a diagonal branch with a tubular 40% to 50% mid  stenosis that is best suited for medical therapy.  No significant obstructive disease.    Circumflex artery is large and nondominant with mild luminal irregularities up to 10%, no significant obstructive disease.    Right coronary artery is a large, dominant vessel with mild irregularities up to 20%, but no significant obstructive disease.    OVERALL IMPRESSION:  1.  Mild nonobstructive coronary disease, right dominant anatomy with 40% to 50% tubular diagonal branch vessel stenosis best suited for medical therapy.  2.  Normal left ventricular systolic function, ejection fraction 60% by echocardiography.  3.  Elevated left ventricular end-diastolic pressure.  4.  Severe left ventricular hypertrophy with diastolic dysfunction.  5.  Chronic kidney disease, creatinine 2.1.    PLAN:  1.  D-Stat hemostatic band right radial artery.  2.  IV hydration.  3.  Optimize medical therapy for his hypertension with amlodipine, carvedilol, clonidine and hydralazine with the addition of ACE inhibitor only if renal function allows.  This is deferred to nephrology.  4.  Will require chronic diuretic therapy due to his volume overload and diastolic heart failure.   5.  Follow up renal function in a.m.    Thank you very much for allowing [place] to participate in the care of this patient.      ___________________  Cyndia Diver MD  Dictated By:.   JMB  D:11/15/2015 11:15:09  T:11/15/2015 11:59:15  [id number]

## 2015-11-15 NOTE — Procedures (Signed)
[facility]  Cardiac Catheterization  NAME:  Gregory Soto, Gregory Soto  CATH PHYSICIAN: Cyndia Diver MD  REF PHYSICIAN:   SEX:   M  DATE: 11/15/2015  DOB: 1960-11-19  LOCATION: CPACU  CATH #:   MR#    [id number]  ACCT#  [id number]        PROCEDURES:  1.  Conscious sedation administration on monitoring between 10:48 a.m. and 11 a.m. with 1 mg Versed and 50 mcg of fentanyl.  2.  Right radial artery access.  3.  Left heart catheterization under fluoroscopy.  4.  Selective right and left coronary angiography.    CATHETER AND MEDICATIONS:  Please see accompanying form for details.    COMPLICATIONS:  None.    ESTIMATED BLOOD LOSS:  Minimal.    SAMPLES:  No sample was removed.    OPERATOR:  Cyndia Diver, MD    ASSISTANT:  Per MAC lab report, cath lab staff.    COMPLICATIONS:  None.    CONTRAST LOAD:  25 mL of Visipaque equivalent to patient.    PREPROCEDURE DIAGNOSES:  1.  Troponin elevation due to non-ST-elevation myocardial infarction versus demand ischemia from malignant hypertension.  2.  Malignant hypertension, previously untreated.  3.  Severe left ventricular hypertrophy.  4.  Dyslipidemia.  5.  Chronic kidney disease, baseline creatinine 2.1.  6.  Prediabetes with metabolic syndrome.  7.  Degenerative joint disease.  8.  ALLERGY TO PENICILLIN.  9.  Elevated body mass index to 41.     FINDINGS     HEMODYNAMICS:  Aortic pressure was 120/73, left ventricular pressure was 120/24, left ventricular end-diastolic pressure was 24.    LEFT VENTRICULOGRAM:  Left ventriculography was not performed due to the patient's chronic kidney disease.  Left ventricular systolic function is known to be normal with ejection fraction 60% by echocardiography with severe left ventricular hypertrophy.    CORONARY ANGIOGRAPHY:  Left main coronary artery is large and normal.  The left anterior descending artery is large and wraps around the apex and has mild luminal irregularities.  There is a diagonal branch with a tubular 40% to 50% mid  stenosis that is best suited for medical therapy.  No significant obstructive disease.    Circumflex artery is large and nondominant with mild luminal irregularities up to 10%, no significant obstructive disease.    Right coronary artery is a large, dominant vessel with mild irregularities up to 20%, but no significant obstructive disease.    OVERALL IMPRESSION:  1.  Mild nonobstructive coronary disease, right dominant anatomy with 40% to 50% tubular diagonal branch vessel stenosis best suited for medical therapy.  2.  Normal left ventricular systolic function, ejection fraction 60% by echocardiography.  3.  Elevated left ventricular end-diastolic pressure.  4.  Severe left ventricular hypertrophy with diastolic dysfunction.  5.  Chronic kidney disease, creatinine 2.1.    PLAN:  1.  D-Stat hemostatic band right radial artery.  2.  IV hydration.  3.  Optimize medical therapy for his hypertension with amlodipine, carvedilol, clonidine and hydralazine with the addition of ACE inhibitor only if renal function allows.  This is deferred to nephrology.  4.  Will require chronic diuretic therapy due to his volume overload and diastolic heart failure.   5.  Follow up renal function in a.m.    Thank you very much for allowing [place] to participate in the care of this patient.      ___________________  Cyndia Diver MD  Dictated By:.   JMB  D:11/15/2015 11:15:09  T:11/15/2015 11:59:15  [id number]

## 2015-11-15 NOTE — Other (Signed)
Bedside shift change report given to Temitope A. Maisie Fus, RN (Cabin crew) by Jon Gills RN Physiological scientist). Report included the following information SBAR, Kardex, Procedure Summary and Cardiac Rhythm SB.

## 2015-11-15 NOTE — Progress Notes (Signed)
TRANSFER - OUT REPORT:    Verbal report given to [employer], RN(name) on Gregory Soto  being transferred to 2W(unit) for routine progression of care       Report consisted of patient???s Situation, Background, Assessment and   Recommendations(SBAR).     Information from the following report(s) SBAR, Procedure Summary and MAR was reviewed with the receiving nurse.    Opportunity for questions and clarification was provided.      Patient transported with:   Registered Nurse

## 2015-11-15 NOTE — Progress Notes (Signed)
Pt return from cath lab. Pt a/o x4. No c/o pain. Cath site to r wrist CDI with no s/s of hematoma or bleeding. Pt with no concerns or questions at this time. No s/s of distress noted. Will continue to monitor.

## 2015-11-15 NOTE — Brief Op Note (Signed)
Brief cath note    Dictated  [id number]    Mild nonobs CAD with 20% irregs and 40% Diag  LVEF 60% by echo severe LVH  LVEDP 24    Best suited for medical amanagement  Trop likely demand ischemia    PLAN:  D stat  Hydration  Aggressive BP control and 2nmdary prevention  Will need diuretic for diastolic dysfunction and BP--> defer to renal wrt timing  appreciate renal assistance in     Cyndia Diver, MD

## 2015-11-15 NOTE — Progress Notes (Signed)
Progress Note    Patient: Gregory Soto   Age:  55 y.o.  DOA: 11/09/2015   Admit Dx / CC: NSTEMI (non-ST elevated myocardial infarction) (HCC)  NSTEMI (non-ST elevated myocardial infarction) (HCC)  LOS:  LOS: 6 days     Active Problems   Active Problems:    NSTEMI (non-ST elevated myocardial infarction) (HCC) (11/09/2015)      Additional Assessments   1. Acute non-ST elevation MI  2. Hypertensive emergency  3. Deafness  4. Obesity  5. Acute kidney injury likely chronic but there is no recent blood work available  6. Left knee sprain   7. Prediabetes/metabolic syndrome  8. Vitamin D deficiency  9. B12 deficiency  Plan:   Patient be continued on current therapy, patient is scheduled for cardiac catheterization today, will repeat basic metabolic profile in the morning  If remains stable keeps improving and cath Is normal possible discharge in a.m.  Subjective:   No new complaints today  Objective:     Visit Vitals   ??? BP (!) 174/92   ??? Pulse 62   ??? Temp 97.8 ??F (36.6 ??C)   ??? Resp 20   ??? Ht  (1.753 m)   ??? Wt 128 kg (282 lb 3 oz)   ??? SpO2 98%   ??? BMI 41.67 kg/m2       Physical Exam:  General appearance: No acute distress   Lungs: CTA bilaterally  Heart: RRR, S1, S2 normal, no murmur,  Extremities: Trace edema distal pulses palpable  Neurologic: Awake and alert    Intake and Output:  Current Shift:     Last three shifts:  08/01 1901 - 08/03 0700  In: 4677 [P.O.:750; I.V.:3927]  Out: 775 [Urine:775]    Lab/Data Reviewed:  Recent Results (from the past 48 hour(s))   PROTEIN URINE, RANDOM    Collection Time: 11/13/15  2:00 PM   Result Value Ref Range    Protein, urine random 9.1 0.0 - 15.0 mg/dl   CREATININE, UR, RANDOM    Collection Time: 11/13/15  2:00 PM   Result Value Ref Range    Creatinine, urine random 64.9 30.0 - 125.0 mg/dl   SODIUM, UR, RANDOM    Collection Time: 11/13/15  2:00 PM   Result Value Ref Range    Sodium urine, random 26 20 - 110 mEq/L   METABOLIC PANEL, BASIC    Collection Time: 11/14/15  3:29 AM    Result Value Ref Range    Sodium 137 136 - 145 mEq/L    Potassium 4.1 3.5 - 5.1 mEq/L    Chloride 107 98 - 107 mEq/L    CO2 21 21 - 32 mEq/L    Glucose 109 (H) 74 - 106 mg/dl    BUN 32 (H) 7 - 25 mg/dl    Creatinine 2.0 (H) 0.6 - 1.3 mg/dl    GFR est AA 16.1      GFR est non-AA 37      Calcium 8.4 (L) 8.5 - 10.1 mg/dl   PTT, CRRT PROTOCOL (PTT DRIP)    Collection Time: 11/14/15  3:29 AM   Result Value Ref Range    PTT drip 69.9 (LL) 70.0 - 105.0 seconds   PTT, CRRT PROTOCOL (PTT DRIP)    Collection Time: 11/14/15 10:04 AM   Result Value Ref Range    PTT drip 84.6 70.0 - 105.0 seconds   PTT, CRRT PROTOCOL (PTT DRIP)    Collection Time: 11/14/15  6:20 PM  Result Value Ref Range    PTT drip 103.1 70.0 - 105.0 seconds   RENAL FUNCTION PANEL    Collection Time: 11/15/15  4:15 AM   Result Value Ref Range    Sodium 137 136 - 145 mEq/L    Potassium 4.2 3.5 - 5.1 mEq/L    Chloride 109 (H) 98 - 107 mEq/L    CO2 20 (L) 21 - 32 mEq/L    Glucose 102 74 - 106 mg/dl    BUN 32 (H) 7 - 25 mg/dl    Creatinine 2.1 (H) 0.6 - 1.3 mg/dl    GFR est AA 42.5      GFR est non-AA 35      Calcium 8.5 8.5 - 10.1 mg/dl    Albumin 3.1 (L) 3.4 - 5.0 gm/dl    Phosphorus 3.8 2.5 - 4.9 mg/dl   PTT, CRRT PROTOCOL (PTT DRIP)    Collection Time: 11/15/15  4:15 AM   Result Value Ref Range    PTT drip 123.8 (HH) 70.0 - 105.0 seconds   POC PT/INR    Collection Time: 11/15/15 10:04 AM   Result Value Ref Range    Prothrombin time 14.2 (H) 0.0 - 14.0 seconds    INR 1.2 (H) 0.0 - 1.1     POC ACTIVATED CLOTTING TIME    Collection Time: 11/15/15 10:53 AM   Result Value Ref Range    Activated clotting time (POC) 156 (H) 79 - 149 seconds       Imaging:  No results found.    Medications Reviewed:  Current Facility-Administered Medications   Medication Dose Route Frequency   ??? fentaNYL citrate (PF) injection 12.5-50 mcg  12.5-50 mcg IntraVENous PRN   ??? nitroglycerin 50 mcg/mL compounded injection  2 Vial IntraCORONary PRN    ??? verapamil (ISOPTIN) 2.5 mg/mL injection 2.5-5 mg  2.5-5 mg IntraarTERial PRN   ??? heparinized saline 2 units/mL infusion 1,000 Units  500 mL Irrigation Multiple   ??? heparin (porcine) 1,000 unit/mL injection 1,000-10,000 Units  1,000-10,000 Units IntraVENous PRN   ??? 0.9% sodium chloride infusion  75 mL/hr IntraVENous CONTINUOUS   ??? acetylcysteine (MUCOMYST) 200 mg/mL (20 %) solution 600 mg  600 mg Oral BID   ??? cyanocobalamin (VITAMIN B12) injection 1,000 mcg  1,000 mcg IntraMUSCular DAILY   ??? ergocalciferol (ERGOCALCIFEROL) capsule 50,000 Units  50,000 Units Oral Q7D   ??? isosorbide mononitrate ER (IMDUR) tablet 60 mg  60 mg Oral DAILY   ??? carvedilol (COREG) tablet 25 mg  25 mg Oral BID WITH MEALS   ??? heparin (porcine) 1,000 unit/mL injection 3,510 Units  40 Units/kg (Adjusted) IntraVENous PRN   ??? heparin (porcine) 25,000 units in 0.45% saline 500 ml infusion  0-36 Units/kg/hr (Adjusted) IntraVENous TITRATE   ??? heparin (porcine) 1,000 unit/mL injection 7,020 Units  80 Units/kg (Adjusted) IntraVENous PRN   ??? amLODIPine (NORVASC) tablet 10 mg  10 mg Oral DAILY   ??? naloxone (NARCAN) injection 0.1 mg  0.1 mg IntraVENous PRN   ??? acetaminophen (TYLENOL) tablet 650 mg  650 mg Oral Q4H PRN    Or   ??? acetaminophen (TYLENOL) solution 650 mg  650 mg Oral Q4H PRN    Or   ??? acetaminophen (TYLENOL) suppository 650 mg  650 mg Rectal Q4H PRN   ??? HYDROcodone-acetaminophen (NORCO) 5-325 mg per tablet 1 Tab  1 Tab Oral Q4H PRN   ??? morphine injection 2 mg  2 mg IntraVENous Q4H PRN   ??? ondansetron (ZOFRAN)  injection 4 mg  4 mg IntraVENous Q4H PRN   ??? hydrALAZINE (APRESOLINE) 20 mg/mL injection 20 mg  20 mg IntraVENous Q6H PRN   ??? hydrALAZINE (APRESOLINE) tablet 100 mg  100 mg Oral TID   ??? aspirin delayed-release tablet 81 mg  81 mg Oral DAILY   ??? atorvastatin (LIPITOR) tablet 80 mg  80 mg Oral QHS   ??? cloNIDine (CATAPRES) 0.3 mg/24 hr patch 1 Patch  1 Patch TransDERmal Q7D    ??? pneumococcal 13 val conj dip (PREVNAR-13) injection 0.5 mL  0.5 mL IntraMUSCular PRIOR TO DISCHARGE         Dragon medical dictation software was used for portions of this report. Unintended errors may occur.   Hazel Sams, MD  P# [phone]  November 15, 2015

## 2015-11-15 NOTE — Progress Notes (Signed)
Discussed with Dr. Rosario Jacks -> okay to proceed with heart cath today.    We're protecting kidneys as best as we can with fluids/mucomyst and Cards will try to limit the contrast as well.    Continue IVF's post- cath at present rate 75/hr.    I will follow up in am.  Anticipate Cr will likely remain stable.

## 2015-11-15 NOTE — Progress Notes (Signed)
Current discharge plan is home with no needs.  Patient declines any home health needs at this time.  His plan, if discharged 11/16/2015, is to return home to [place] on Sunday, 11/18/2015.  Continue to monitor.

## 2015-11-16 LAB — FREE LIGHT CHAINS, KAPPA/LAMBDA, QT
Free Kappa Serum: 19.4 mg/L (ref 3.3–19.4)
Free Kappa/Free Lambda Ratio: 1.46 (ref 0.26–1.65)
Free Lambda Serum: 13.3 mg/L (ref 5.7–26.3)

## 2015-11-16 LAB — RENAL FUNCTION PANEL
Albumin: 3.1 gm/dl — ABNORMAL LOW (ref 3.4–5.0)
BUN: 31 mg/dl — ABNORMAL HIGH (ref 7–25)
CO2: 18 mEq/L — ABNORMAL LOW (ref 21–32)
Calcium: 8.6 mg/dl (ref 8.5–10.1)
Chloride: 109 mEq/L — ABNORMAL HIGH (ref 98–107)
Creatinine: 2 mg/dl — ABNORMAL HIGH (ref 0.6–1.3)
GFR est AA: 45
GFR est non-AA: 37
Glucose: 97 mg/dl (ref 74–106)
Phosphorus: 3.6 mg/dl (ref 2.5–4.9)
Potassium: 4.1 mEq/L (ref 3.5–5.1)
Sodium: 138 mEq/L (ref 136–145)

## 2015-11-16 LAB — CBC WITH AUTOMATED DIFF
BASOPHILS: 0.2 % (ref 0–3)
EOSINOPHILS: 1.5 % (ref 0–5)
HCT: 37.6 % (ref 37.0–50.0)
HGB: 12.5 gm/dl (ref 12.4–17.2)
IMMATURE GRANULOCYTES: 0.4 % (ref 0.0–3.0)
LYMPHOCYTES: 13.9 % — ABNORMAL LOW (ref 28–48)
MCH: 30.1 pg (ref 23.0–34.6)
MCHC: 33.2 gm/dl (ref 30.0–36.0)
MCV: 90.6 fL (ref 80.0–98.0)
MONOCYTES: 9.6 % (ref 1–13)
MPV: 11.6 fL — ABNORMAL HIGH (ref 6.0–10.0)
NEUTROPHILS: 74.4 % — ABNORMAL HIGH (ref 34–64)
NRBC: 0 (ref 0–0)
PLATELET: 174 10*3/uL (ref 140–450)
RBC: 4.15 M/uL (ref 3.80–5.70)
RDW-SD: 47.7 — ABNORMAL HIGH (ref 35.1–43.9)
WBC: 8.2 10*3/uL (ref 4.0–11.0)

## 2015-11-16 LAB — ALDOSTERONE: Aldosterone: <1 ng/dL

## 2015-11-16 MED ORDER — ATORVASTATIN 80 MG TAB
80 mg | ORAL_TABLET | Freq: Every evening | ORAL | 3 refills | Status: AC
Start: 2015-11-16 — End: ?

## 2015-11-16 MED ORDER — ASPIRIN 81 MG TAB, DELAYED RELEASE
81 mg | ORAL_TABLET | Freq: Every day | ORAL | 3 refills | Status: AC
Start: 2015-11-16 — End: ?

## 2015-11-16 MED ORDER — CARVEDILOL 25 MG TAB
25 mg | ORAL_TABLET | Freq: Two times a day (BID) | ORAL | 3 refills | Status: AC
Start: 2015-11-16 — End: ?

## 2015-11-16 MED ORDER — SYRINGE WITH NEEDLE, SAFETY 3 ML 25 GAUGE X 5/8"
3 mL 25 gauge x 5/8" | PEN_INJECTOR | 3 refills | Status: AC
Start: 2015-11-16 — End: ?

## 2015-11-16 MED ORDER — ISOSORBIDE MONONITRATE SR 60 MG 24 HR TAB
60 mg | ORAL_TABLET | Freq: Every day | ORAL | 3 refills | Status: AC
Start: 2015-11-16 — End: ?

## 2015-11-16 MED ORDER — ERGOCALCIFEROL (VITAMIN D2) 50,000 UNIT CAP
1250 mcg (50,000 unit) | ORAL_CAPSULE | ORAL | 3 refills | Status: AC
Start: 2015-11-16 — End: ?

## 2015-11-16 MED ORDER — FUROSEMIDE 10 MG/ML IJ SOLN
10 mg/mL | Freq: Once | INTRAMUSCULAR | Status: AC
Start: 2015-11-16 — End: 2015-11-16
  Administered 2015-11-16: 16:00:00 via INTRAVENOUS

## 2015-11-16 MED ORDER — CYANOCOBALAMIN 1,000 MCG/ML IJ SOLN
1000 mcg/mL | INTRAMUSCULAR | 3 refills | Status: AC
Start: 2015-11-16 — End: ?

## 2015-11-16 MED ORDER — HYDRALAZINE 100 MG TAB
100 mg | ORAL_TABLET | Freq: Three times a day (TID) | ORAL | 3 refills | Status: AC
Start: 2015-11-16 — End: ?

## 2015-11-16 MED FILL — PREVNAR 13 (PF) 0.5 ML INTRAMUSCULAR SYRINGE: 0.5 mL | INTRAMUSCULAR | Qty: 0.5

## 2015-11-16 MED FILL — FUROSEMIDE 10 MG/ML IJ SOLN: 10 mg/mL | INTRAMUSCULAR | Qty: 4

## 2015-11-16 MED FILL — CLONIDINE 0.3 MG/24 HR WEEKLY TRANSDERM PATCH: 0.3 mg/24 hr | TRANSDERMAL | Qty: 1

## 2015-11-16 MED FILL — HYDRALAZINE 50 MG TAB: 50 mg | ORAL | Qty: 2

## 2015-11-16 MED FILL — AMLODIPINE 5 MG TAB: 5 mg | ORAL | Qty: 2

## 2015-11-16 MED FILL — BD POSIFLUSH NORMAL SALINE 0.9 % INJECTION SYRINGE: INTRAMUSCULAR | Qty: 10

## 2015-11-16 MED FILL — ISOSORBIDE MONONITRATE SR 60 MG 24 HR TAB: 60 mg | ORAL | Qty: 1

## 2015-11-16 MED FILL — LIPITOR 40 MG TABLET: 40 mg | ORAL | Qty: 2

## 2015-11-16 MED FILL — ASPIRIN 81 MG TAB, DELAYED RELEASE: 81 mg | ORAL | Qty: 1

## 2015-11-16 MED FILL — CARVEDILOL 25 MG TAB: 25 mg | ORAL | Qty: 1

## 2015-11-16 MED FILL — HEPARIN (PORCINE) 5,000 UNIT/ML IJ SOLN: 5000 unit/mL | INTRAMUSCULAR | Qty: 1

## 2015-11-16 MED FILL — ACETYLCYSTEINE 20 % (200 MG/ML) SOLN: 200 mg/mL (20 %) | Qty: 4

## 2015-11-16 MED FILL — CYANOCOBALAMIN 1,000 MCG/ML IJ SOLN: 1000 mcg/mL | INTRAMUSCULAR | Qty: 1

## 2015-11-16 NOTE — Discharge Summary (Signed)
Discharge Summary    Patient: Gregory Soto               Sex: male          DOA: 11/09/2015         Date of Birth:  17-Nov-1960      Age:  55 y.o.        LOS:  LOS: 7 days                Admit Date: 11/09/2015    Discharge Date: 11/16/2015    Admission Diagnoses: NSTEMI (non-ST elevated myocardial infarction) [facility])  NSTEMI (non-ST elevated myocardial infarction) [facility])    Discharge Diagnoses:   1. Acute non-ST elevation MI  2. Hypertensive emergency  3. Deafness  4. Obesity r/o OSA  5. Acute kidney injury likely chronic but there is no recent blood work available  6. Left knee sprain   7. Prediabetes/metabolic syndrome  8. Vitamin D deficiency  9. B12 deficiency  10.Chronic diastolic dysfunction  11.LVH  Current Discharge Medication List      START taking these medications    Details   aspirin delayed-release 81 mg tablet Take 1 Tab by mouth daily.  Qty: 100 Tab, Refills: 3      atorvastatin (LIPITOR) 80 mg tablet Take 1 Tab by mouth nightly.  Qty: 30 Tab, Refills: 3      carvedilol (COREG) 25 mg tablet Take 1 Tab by mouth two (2) times daily (with meals).  Qty: 60 Tab, Refills: 3      cyanocobalamin (VITAMIN B12) 1,000 mcg/mL injection 1 mL by IntraMUSCular route every seven (7) days.  Qty: 10 Vial, Refills: 3      ergocalciferol (ERGOCALCIFEROL) 50,000 unit capsule Take 1 Cap by mouth every seven (7) days.  Qty: 4 Cap, Refills: 3      hydrALAZINE (APRESOLINE) 100 mg tablet Take 1 Tab by mouth three (3) times daily.  Qty: 90 Tab, Refills: 3      isosorbide mononitrate ER (IMDUR) 60 mg CR tablet Take 1 Tab by mouth daily.  Qty: 30 Tab, Refills: 3      Syringe with Needle, Safety (3CC SAFETY SYRINGE 25GX5/8") 3 mL 25 gauge x 5/8" syrg 1,000 mcg by Does Not Apply route every seven (7) days.  Qty: 10 Pen Needle, Refills: 3         CONTINUE these medications which have NOT CHANGED    Details   amLODIPine (NORVASC) 10 mg tablet Take 10 mg by mouth daily.           Discharge Medications:     Current Discharge Medication List       START taking these medications    Details   aspirin delayed-release 81 mg tablet Take 1 Tab by mouth daily.  Qty: 100 Tab, Refills: 3      atorvastatin (LIPITOR) 80 mg tablet Take 1 Tab by mouth nightly.  Qty: 30 Tab, Refills: 3      carvedilol (COREG) 25 mg tablet Take 1 Tab by mouth two (2) times daily (with meals).  Qty: 60 Tab, Refills: 3      cyanocobalamin (VITAMIN B12) 1,000 mcg/mL injection 1 mL by IntraMUSCular route every seven (7) days.  Qty: 10 Vial, Refills: 3      ergocalciferol (ERGOCALCIFEROL) 50,000 unit capsule Take 1 Cap by mouth every seven (7) days.  Qty: 4 Cap, Refills: 3      hydrALAZINE (APRESOLINE) 100 mg tablet Take 1 Tab by  mouth three (3) times daily.  Qty: 90 Tab, Refills: 3      isosorbide mononitrate ER (IMDUR) 60 mg CR tablet Take 1 Tab by mouth daily.  Qty: 30 Tab, Refills: 3      Syringe with Needle, Safety (3CC SAFETY SYRINGE 25GX5/8") 3 mL 25 gauge x 5/8" syrg 1,000 mcg by Does Not Apply route every seven (7) days.  Qty: 10 Pen Needle, Refills: 3         CONTINUE these medications which have NOT CHANGED    Details   amLODIPine (NORVASC) 10 mg tablet Take 10 mg by mouth daily.             Follow-up: PCP 1 week  Nephrology 2-3 weeks     Discharge Condition: stable    Activity: as tolerated    Diet: cardiac    Labs:  Labs: Results:   Chemistry Recent Labs      11/16/15   0354  11/15/15   0415  11/14/15   0329   GLU  97  102  109*   NA  138  137  137   K  4.1  4.2  4.1   CL  109*  109*  107   CO2  18*  20*  21   BUN  31*  32*  32*   CREA  2.0*  2.1*  2.0*   CA  8.6  8.5  8.4*   ALB  3.1*  3.1*   --       CBC w/Diff     CBC WITH AUTOMATED DIFF    Collection Time: 11/16/15  3:54 AM   Result Value Ref Range    WBC 8.2 4.0 - 11.0 1000/mm3    RBC 4.15 3.80 - 5.70 M/uL    HGB 12.5 12.4 - 17.2 gm/dl    HCT 16.1 09.6 - 04.5 %    MCV 90.6 80.0 - 98.0 fL    MCH 30.1 23.0 - 34.6 pg    MCHC 33.2 30.0 - 36.0 gm/dl    PLATELET 409 811 - 914 1000/mm3    MPV 11.6 (H) 6.0 - 10.0 fL    RDW-SD 47.7  (H) 35.1 - 43.9      NRBC 0 0 - 0      IMMATURE GRANULOCYTES 0.4 0.0 - 3.0 %    NEUTROPHILS 74.4 (H) 34 - 64 %    LYMPHOCYTES 13.9 (L) 28 - 48 %    MONOCYTES 9.6 1 - 13 %    EOSINOPHILS 1.5 0 - 5 %    BASOPHILS 0.2 0 - 3 %      Cardiac Enzymes Lab Results   Component Value Date/Time    CK 127 11/10/2015 03:36 AM    CK - MB 9.3 11/10/2015 03:36 AM    Troponin-I 2.400 11/10/2015 06:38 AM      Coagulation Lab Results   Component Value Date/Time    INR 1.2 11/15/2015 10:04 AM    INR 1.0 11/09/2015 05:01 PM    Prothrombin time 14.2 11/15/2015 10:04 AM    Prothrombin time 11.7 11/09/2015 05:01 PM      Lipid Panel Lab Results   Component Value Date/Time    Cholesterol, total 179 11/10/2015 03:36 AM    HDL Cholesterol 34 11/10/2015 03:36 AM    LDL, calculated 117 11/10/2015 03:36 AM    Triglyceride 139 11/10/2015 03:36 AM    CHOL/HDL Ratio 5.3 11/10/2015 03:36 AM      BNP  No results found for: BNP, BNPP, BNPPPOC, XBNPT, BNPNT   Liver Enzymes Recent Labs      11/16/15   0354   ALB  3.1*      Vitamins Lab Results   Component Value Date/Time    Vitamin D, 25-OH, Total 21.0 11/10/2015 03:36 AM       No results found for: B12LT, FOL, RBCF   Thyroid Studies Lab Results   Component Value Date/Time    TSH 2.840 11/10/2015 03:36 AM          Imaging/Procedures:   No results found.  CT Results (most recent):  No results found for this or any previous visit.  MRI Results (most recent):  No results found for this or any previous visit.  XR Results (most recent):    Results from Hospital Encounter encounter on 11/09/15   XR CHEST SNGL V   Narrative Indication: Diffuse chest pain.         Impression IMPRESSION: Portable AP upright view the chest exposed at 4:51 PM November 09, 2015  reveals the lungs are clear and the heart is mildly enlarged. Pulmonary  vasculature is within normal limits.        [place] Results (most recent):    Results from Hospital Encounter encounter on 11/09/15   [place] RETROPERITONEUM COMP   Narrative Indication: Chronic renal  failure    Sonographic evaluation of the kidney shows normal size bilaterally at 11.1 cm on  the right and 13.1 cm on the left. Both kidneys are mildly increased in  echogenicity with cortical thinning. No hydronephrosis. Bladder unremarkable.  Prostate normal size.         Impression IMPRESSION: Medical renal disease.        Results for orders placed or performed during the hospital encounter of 11/09/15   EKG, 12 LEAD, INITIAL   Result Value Ref Range    Ventricular Rate 80 BPM    Atrial Rate 80 BPM    P-R Interval 152 ms    QRS Duration 106 ms    Q-T Interval 428 ms    QTC Calculation (Bezet) 493 ms    Calculated P Axis 35 degrees    Calculated R Axis 20 degrees    Calculated T Axis -65 degrees    Diagnosis       Sinus rhythm with premature atrial complexes  Possible Left atrial enlargement  ST & T wave abnormality, consider inferolateral ischemia  Prolonged QT  Abnormal ECG  When compared with ECG of 07-Apr-1997 22:10,  Anterolateral q waves now present.    Confirmed by Avon Gully, MD, Jomarie Longs [phone]) on 11/10/2015 9:48:53 AM       EKG Results     Procedure 720 Value Units Date/Time    SCANNED CARDIAC RHYTHM STRIP [[id number]] Collected:  11/16/15 1019    Order Status:  Completed Updated:  11/16/15 1129    SCANNED CARDIAC RHYTHM STRIP [[id number]] Collected:  11/16/15 0634    Order Status:  Completed Updated:  11/16/15 0752    SCANNED CARDIAC RHYTHM STRIP [[id number]] Collected:  11/16/15 0127    Order Status:  Completed Updated:  11/16/15 0130    SCANNED CARDIAC RHYTHM STRIP [[id number]] Collected:  11/15/15 2033    Order Status:  Completed Updated:  11/15/15 2037    SCANNED CARDIAC RHYTHM STRIP [[id number]] Collected:  11/15/15 1845    Order Status:  Completed Updated:  11/15/15 1851    SCANNED CARDIAC RHYTHM STRIP [[id number]] Collected:  11/15/15 1014    Order  Status:  Completed Updated:  11/15/15 1133    SCANNED CARDIAC RHYTHM STRIP [[id number]] Collected:  11/15/15 0823    Order Status:  Completed Updated:  11/15/15 0849     SCANNED CARDIAC RHYTHM STRIP [[id number]] Collected:  11/15/15 0043    Order Status:  Completed Updated:  11/15/15 0046    SCANNED CARDIAC RHYTHM STRIP [[id number]] Collected:  11/14/15 2206    Order Status:  Completed Updated:  11/14/15 2214    SCANNED CARDIAC RHYTHM STRIP [[id number]] Collected:  11/14/15 1920    Order Status:  Completed Updated:  11/14/15 1929    SCANNED CARDIAC RHYTHM STRIP [[id number]] Collected:  11/14/15 1434    Order Status:  Completed Updated:  11/14/15 1444    SCANNED CARDIAC RHYTHM STRIP [[id number]] Collected:  11/14/15 1029    Order Status:  Completed Updated:  11/14/15 1042    SCANNED CARDIAC RHYTHM STRIP [[id number]] Collected:  11/14/15 0625    Order Status:  Completed Updated:  11/14/15 0636    SCANNED CARDIAC RHYTHM STRIP [[id number]] Collected:  11/14/15 0116    Order Status:  Completed Updated:  11/14/15 0118    SCANNED CARDIAC RHYTHM STRIP [[id number]] Collected:  11/13/15 2018    Order Status:  Completed Updated:  11/13/15 2021    SCANNED CARDIAC RHYTHM STRIP [[id number]] Collected:  11/13/15 1828    Order Status:  Completed Updated:  11/13/15 1838    SCANNED CARDIAC RHYTHM STRIP [[id number]] Collected:  11/13/15 1433    Order Status:  Completed Updated:  11/13/15 1445    SCANNED CARDIAC RHYTHM STRIP [[id number]] Collected:  11/13/15 1030    Order Status:  Completed Updated:  11/13/15 1036    SCANNED CARDIAC RHYTHM STRIP [[id number]] Collected:  11/13/15 0808    Order Status:  Completed Updated:  11/13/15 0858    SCANNED CARDIAC RHYTHM STRIP [[id number]] Collected:  11/13/15 0801    Order Status:  Completed Updated:  11/13/15 0807    SCANNED CARDIAC RHYTHM STRIP [[id number]] Collected:  11/13/15 0131    Order Status:  Completed Updated:  11/13/15 0134    SCANNED CARDIAC RHYTHM STRIP [[id number]] Collected:  11/12/15 2055    Order Status:  Completed Updated:  11/12/15 2057    SCANNED CARDIAC RHYTHM STRIP [[id number]] Collected:  11/12/15 1426    Order Status:  Completed Updated:  11/12/15  1459    SCANNED CARDIAC RHYTHM STRIP [[id number]] Collected:  11/12/15 1018    Order Status:  Completed Updated:  11/12/15 1115    SCANNED CARDIAC RHYTHM STRIP [[id number]] Collected:  11/11/15 1353    Order Status:  Completed Updated:  11/12/15 0731    SCANNED CARDIAC RHYTHM STRIP [[id number]] Collected:  11/12/15 0658    Order Status:  Completed Updated:  11/12/15 0702    SCANNED CARDIAC RHYTHM STRIP [[id number]] Collected:  11/12/15 0148    Order Status:  Completed Updated:  11/12/15 0154    SCANNED CARDIAC RHYTHM STRIP [[id number]] Collected:  11/11/15 2223    Order Status:  Completed Updated:  11/11/15 2230    SCANNED CARDIAC RHYTHM STRIP [[id number]] Collected:  11/11/15 1446    Order Status:  Completed Updated:  11/11/15 1629    SCANNED CARDIAC RHYTHM STRIP [[id number]] Collected:  11/11/15 1601    Order Status:  Completed Updated:  11/11/15 1604    SCANNED CARDIAC RHYTHM STRIP [[id number]] Collected:  11/11/15 0811    Order Status:  Completed Updated:  11/11/15 1318    SCANNED CARDIAC RHYTHM STRIP [[id number]] Collected:  11/11/15 0225    Order  Status:  Completed Updated:  11/11/15 0229    SCANNED CARDIAC RHYTHM STRIP [[id number]] Collected:  11/10/15 2223    Order Status:  Completed Updated:  11/10/15 2226    SCANNED CARDIAC RHYTHM STRIP [[id number]] Collected:  11/10/15 1630    Order Status:  Completed Updated:  11/10/15 1632    SCANNED CARDIAC RHYTHM STRIP [[id number]] Collected:  11/10/15 1630    Order Status:  Completed Updated:  11/10/15 1632    SCANNED CARDIAC RHYTHM STRIP [[id number]] Collected:  11/10/15 1411    Order Status:  Completed Updated:  11/10/15 1417    EKG, 12 LEAD, INITIAL [[id number]] Collected:  11/09/15 1535    Order Status:  Completed Updated:  11/10/15 0949     Ventricular Rate 80 BPM      Atrial Rate 80 BPM      P-R Interval 152 ms      QRS Duration 106 ms      Q-T Interval 428 ms      QTC Calculation (Bezet) 493 ms      Calculated P Axis 35 degrees      Calculated R Axis 20 degrees       Calculated T Axis -65 degrees      Diagnosis --     Sinus rhythm with premature atrial complexes  Possible Left atrial enlargement  ST & T wave abnormality, consider inferolateral ischemia  Prolonged QT  Abnormal ECG  When compared with ECG of 07-Apr-1997 22:10,  Anterolateral q waves now present.    Confirmed by Avon Gully, MD, Jomarie Longs [phone]) on 11/10/2015 9:48:53 AM      SCANNED CARDIAC RHYTHM STRIP [[id number]] Collected:  11/10/15 0847    Order Status:  Completed Updated:  11/10/15 0925          Consults: Cardiology,Nephrology    Treatment Team: Treatment Team: Attending Provider: Hazel Sams, MD; Consulting Provider: Donell Beers, MD; Consulting Provider: Hazel Sams, MD; Occupational Therapist: Jill Side, OT; Consulting Provider: Vernard Gambles, MD; Transitional Nurse Navigator: Lasandra Beech    Significant Diagnostic Studies:Cardiac cath no significant CAD  Severe LVH and diastolic dysfunction    Hospital Course:   Patient is being admitted to PCU for acute non-ST elevation MI, patient be started on aspirin, beta blocker, hydralazine and Lipitor. For hypertensive emergency patient be started on nitroglycerin drip which will be titrated to keep systolic blood pressure less than 160. Patient be started on heparin drip,  We will avoid ACE inhibitor due to acute kidney injury, cardiology consult done, we'll order an echocardiogram, will check lipid and hemoglobin A1c in the morning, further management depending on patient's progress and recommendations from cardiology. If renal function improves patient will need cardiac catheterization on Monday.  ??  Patient be continued on current therapy, patient had cardiac catheterization done yesterday which showed mild nonobstructive coronary artery disease normal ejection fraction patient had severe left ventricular hypertrophy and diastolic dysfunction,  Patient is being discharged home.detail discharge summary  Will be dictated.  Dragon medical dictation  software was used for portions of this report. Unintended errors may occur.     Hazel Sams, MD  November 16, 2015        Total time spent: Over 30 min.

## 2015-11-16 NOTE — Discharge Summary (Signed)
Discharge Summary    Patient: Gregory Soto               Sex: male          DOA: 11/09/2015         Date of Birth:  Nov 15, 1960      Age:  55 y.o.        LOS:  LOS: 7 days                Admit Date: 11/09/2015    Discharge Date: 11/16/2015    Admission Diagnoses: NSTEMI (non-ST elevated myocardial infarction) [facility])  NSTEMI (non-ST elevated myocardial infarction) [facility])    Discharge Diagnoses:   1. Acute non-ST elevation MI  2. Hypertensive emergency  3. Deafness  4. Obesity r/o OSA  5. Acute kidney injury likely chronic but there is no recent blood work available  6. Left knee sprain   7. Prediabetes/metabolic syndrome  8. Vitamin D deficiency  9. B12 deficiency  10.Chronic diastolic dysfunction  11.LVH  Current Discharge Medication List      START taking these medications    Details   aspirin delayed-release 81 mg tablet Take 1 Tab by mouth daily.  Qty: 100 Tab, Refills: 3      atorvastatin (LIPITOR) 80 mg tablet Take 1 Tab by mouth nightly.  Qty: 30 Tab, Refills: 3      carvedilol (COREG) 25 mg tablet Take 1 Tab by mouth two (2) times daily (with meals).  Qty: 60 Tab, Refills: 3      cyanocobalamin (VITAMIN B12) 1,000 mcg/mL injection 1 mL by IntraMUSCular route every seven (7) days.  Qty: 10 Vial, Refills: 3      ergocalciferol (ERGOCALCIFEROL) 50,000 unit capsule Take 1 Cap by mouth every seven (7) days.  Qty: 4 Cap, Refills: 3      hydrALAZINE (APRESOLINE) 100 mg tablet Take 1 Tab by mouth three (3) times daily.  Qty: 90 Tab, Refills: 3      isosorbide mononitrate ER (IMDUR) 60 mg CR tablet Take 1 Tab by mouth daily.  Qty: 30 Tab, Refills: 3      Syringe with Needle, Safety (3CC SAFETY SYRINGE 25GX5/8") 3 mL 25 gauge x 5/8" syrg 1,000 mcg by Does Not Apply route every seven (7) days.  Qty: 10 Pen Needle, Refills: 3         CONTINUE these medications which have NOT CHANGED    Details   amLODIPine (NORVASC) 10 mg tablet Take 10 mg by mouth daily.           Discharge Medications:     Current Discharge Medication List       START taking these medications    Details   aspirin delayed-release 81 mg tablet Take 1 Tab by mouth daily.  Qty: 100 Tab, Refills: 3      atorvastatin (LIPITOR) 80 mg tablet Take 1 Tab by mouth nightly.  Qty: 30 Tab, Refills: 3      carvedilol (COREG) 25 mg tablet Take 1 Tab by mouth two (2) times daily (with meals).  Qty: 60 Tab, Refills: 3      cyanocobalamin (VITAMIN B12) 1,000 mcg/mL injection 1 mL by IntraMUSCular route every seven (7) days.  Qty: 10 Vial, Refills: 3      ergocalciferol (ERGOCALCIFEROL) 50,000 unit capsule Take 1 Cap by mouth every seven (7) days.  Qty: 4 Cap, Refills: 3      hydrALAZINE (APRESOLINE) 100 mg tablet Take 1 Tab by  mouth three (3) times daily.  Qty: 90 Tab, Refills: 3      isosorbide mononitrate ER (IMDUR) 60 mg CR tablet Take 1 Tab by mouth daily.  Qty: 30 Tab, Refills: 3      Syringe with Needle, Safety (3CC SAFETY SYRINGE 25GX5/8") 3 mL 25 gauge x 5/8" syrg 1,000 mcg by Does Not Apply route every seven (7) days.  Qty: 10 Pen Needle, Refills: 3         CONTINUE these medications which have NOT CHANGED    Details   amLODIPine (NORVASC) 10 mg tablet Take 10 mg by mouth daily.             Follow-up: PCP 1 week  Nephrology 2-3 weeks     Discharge Condition: stable    Activity: as tolerated    Diet: cardiac    Labs:  Labs: Results:   Chemistry Recent Labs      11/16/15   0354  11/15/15   0415  11/14/15   0329   GLU  97  102  109*   NA  138  137  137   K  4.1  4.2  4.1   CL  109*  109*  107   CO2  18*  20*  21   BUN  31*  32*  32*   CREA  2.0*  2.1*  2.0*   CA  8.6  8.5  8.4*   ALB  3.1*  3.1*   --       CBC w/Diff     CBC WITH AUTOMATED DIFF    Collection Time: 11/16/15  3:54 AM   Result Value Ref Range    WBC 8.2 4.0 - 11.0 1000/mm3    RBC 4.15 3.80 - 5.70 M/uL    HGB 12.5 12.4 - 17.2 gm/dl    HCT 82.9 56.2 - 13.0 %    MCV 90.6 80.0 - 98.0 fL    MCH 30.1 23.0 - 34.6 pg    MCHC 33.2 30.0 - 36.0 gm/dl    PLATELET 865 784 - 696 1000/mm3    MPV 11.6 (H) 6.0 - 10.0 fL     RDW-SD 47.7 (H) 35.1 - 43.9      NRBC 0 0 - 0      IMMATURE GRANULOCYTES 0.4 0.0 - 3.0 %    NEUTROPHILS 74.4 (H) 34 - 64 %    LYMPHOCYTES 13.9 (L) 28 - 48 %    MONOCYTES 9.6 1 - 13 %    EOSINOPHILS 1.5 0 - 5 %    BASOPHILS 0.2 0 - 3 %      Cardiac Enzymes Lab Results   Component Value Date/Time    CK 127 11/10/2015 03:36 AM    CK - MB 9.3 11/10/2015 03:36 AM    Troponin-I 2.400 11/10/2015 06:38 AM      Coagulation Lab Results   Component Value Date/Time    INR 1.2 11/15/2015 10:04 AM    INR 1.0 11/09/2015 05:01 PM    Prothrombin time 14.2 11/15/2015 10:04 AM    Prothrombin time 11.7 11/09/2015 05:01 PM      Lipid Panel Lab Results   Component Value Date/Time    Cholesterol, total 179 11/10/2015 03:36 AM    HDL Cholesterol 34 11/10/2015 03:36 AM    LDL, calculated 117 11/10/2015 03:36 AM    Triglyceride 139 11/10/2015 03:36 AM    CHOL/HDL Ratio 5.3 11/10/2015 03:36 AM      BNP  No results found for: BNP, BNPP, BNPPPOC, XBNPT, BNPNT   Liver Enzymes Recent Labs      11/16/15   0354   ALB  3.1*      Vitamins Lab Results   Component Value Date/Time    Vitamin D, 25-OH, Total 21.0 11/10/2015 03:36 AM       No results found for: B12LT, FOL, RBCF   Thyroid Studies Lab Results   Component Value Date/Time    TSH 2.840 11/10/2015 03:36 AM          Imaging/Procedures:   No results found.  CT Results (most recent):  No results found for this or any previous visit.  MRI Results (most recent):  No results found for this or any previous visit.  XR Results (most recent):    Results from Hospital Encounter encounter on 11/09/15   XR CHEST SNGL V   Narrative Indication: Diffuse chest pain.         Impression IMPRESSION: Portable AP upright view the chest exposed at 4:51 PM November 09, 2015  reveals the lungs are clear and the heart is mildly enlarged. Pulmonary  vasculature is within normal limits.        [place] Results (most recent):    Results from Hospital Encounter encounter on 11/09/15   [place] RETROPERITONEUM COMP    Narrative Indication: Chronic renal failure    Sonographic evaluation of the kidney shows normal size bilaterally at 11.1 cm on  the right and 13.1 cm on the left. Both kidneys are mildly increased in  echogenicity with cortical thinning. No hydronephrosis. Bladder unremarkable.  Prostate normal size.         Impression IMPRESSION: Medical renal disease.        Results for orders placed or performed during the hospital encounter of 11/09/15   EKG, 12 LEAD, INITIAL   Result Value Ref Range    Ventricular Rate 80 BPM    Atrial Rate 80 BPM    P-R Interval 152 ms    QRS Duration 106 ms    Q-T Interval 428 ms    QTC Calculation (Bezet) 493 ms    Calculated P Axis 35 degrees    Calculated R Axis 20 degrees    Calculated T Axis -65 degrees    Diagnosis       Sinus rhythm with premature atrial complexes  Possible Left atrial enlargement  ST & T wave abnormality, consider inferolateral ischemia  Prolonged QT  Abnormal ECG  When compared with ECG of 07-Apr-1997 22:10,  Anterolateral q waves now present.    Confirmed by Avon Gully, MD, Jomarie Longs [id number]) on 11/10/2015 9:48:53 AM       EKG Results     Procedure 720 Value Units Date/Time    SCANNED CARDIAC RHYTHM STRIP [[id number]] Collected:  11/16/15 1019    Order Status:  Completed Updated:  11/16/15 1129    SCANNED CARDIAC RHYTHM STRIP [[id number]] Collected:  11/16/15 0634    Order Status:  Completed Updated:  11/16/15 0752    SCANNED CARDIAC RHYTHM STRIP [[id number]] Collected:  11/16/15 0127    Order Status:  Completed Updated:  11/16/15 0130    SCANNED CARDIAC RHYTHM STRIP [[id number]] Collected:  11/15/15 2033    Order Status:  Completed Updated:  11/15/15 2037    SCANNED CARDIAC RHYTHM STRIP [[id number]] Collected:  11/15/15 1845    Order Status:  Completed Updated:  11/15/15 1851    SCANNED CARDIAC RHYTHM STRIP [[id number]] Collected:  11/15/15 1014    Order  Status:  Completed Updated:  11/15/15 1133    SCANNED CARDIAC RHYTHM STRIP [[id number]] Collected:  11/15/15 0823     Order Status:  Completed Updated:  11/15/15 0849    SCANNED CARDIAC RHYTHM STRIP [[id number]] Collected:  11/15/15 0043    Order Status:  Completed Updated:  11/15/15 0046    SCANNED CARDIAC RHYTHM STRIP [[id number]] Collected:  11/14/15 2206    Order Status:  Completed Updated:  11/14/15 2214    SCANNED CARDIAC RHYTHM STRIP [[id number]] Collected:  11/14/15 1920    Order Status:  Completed Updated:  11/14/15 1929    SCANNED CARDIAC RHYTHM STRIP [[id number]] Collected:  11/14/15 1434    Order Status:  Completed Updated:  11/14/15 1444    SCANNED CARDIAC RHYTHM STRIP [[id number]] Collected:  11/14/15 1029    Order Status:  Completed Updated:  11/14/15 1042    SCANNED CARDIAC RHYTHM STRIP [[id number]] Collected:  11/14/15 0625    Order Status:  Completed Updated:  11/14/15 0636    SCANNED CARDIAC RHYTHM STRIP [[id number]] Collected:  11/14/15 0116    Order Status:  Completed Updated:  11/14/15 0118    SCANNED CARDIAC RHYTHM STRIP [[id number]] Collected:  11/13/15 2018    Order Status:  Completed Updated:  11/13/15 2021    SCANNED CARDIAC RHYTHM STRIP [[id number]] Collected:  11/13/15 1828    Order Status:  Completed Updated:  11/13/15 1838    SCANNED CARDIAC RHYTHM STRIP [[id number]] Collected:  11/13/15 1433    Order Status:  Completed Updated:  11/13/15 1445    SCANNED CARDIAC RHYTHM STRIP [[id number]] Collected:  11/13/15 1030    Order Status:  Completed Updated:  11/13/15 1036    SCANNED CARDIAC RHYTHM STRIP [[id number]] Collected:  11/13/15 0808    Order Status:  Completed Updated:  11/13/15 0858    SCANNED CARDIAC RHYTHM STRIP [[id number]] Collected:  11/13/15 0801    Order Status:  Completed Updated:  11/13/15 0807    SCANNED CARDIAC RHYTHM STRIP [[id number]] Collected:  11/13/15 0131    Order Status:  Completed Updated:  11/13/15 0134    SCANNED CARDIAC RHYTHM STRIP [[id number]] Collected:  11/12/15 2055    Order Status:  Completed Updated:  11/12/15 2057     SCANNED CARDIAC RHYTHM STRIP [[id number]] Collected:  11/12/15 1426    Order Status:  Completed Updated:  11/12/15 1459    SCANNED CARDIAC RHYTHM STRIP [[id number]] Collected:  11/12/15 1018    Order Status:  Completed Updated:  11/12/15 1115    SCANNED CARDIAC RHYTHM STRIP [[id number]] Collected:  11/11/15 1353    Order Status:  Completed Updated:  11/12/15 0731    SCANNED CARDIAC RHYTHM STRIP [[id number]] Collected:  11/12/15 0658    Order Status:  Completed Updated:  11/12/15 0702    SCANNED CARDIAC RHYTHM STRIP [[id number]] Collected:  11/12/15 0148    Order Status:  Completed Updated:  11/12/15 0154    SCANNED CARDIAC RHYTHM STRIP [[id number]] Collected:  11/11/15 2223    Order Status:  Completed Updated:  11/11/15 2230    SCANNED CARDIAC RHYTHM STRIP [[id number]] Collected:  11/11/15 1446    Order Status:  Completed Updated:  11/11/15 1629    SCANNED CARDIAC RHYTHM STRIP [[id number]] Collected:  11/11/15 1601    Order Status:  Completed Updated:  11/11/15 1604    SCANNED CARDIAC RHYTHM STRIP [[id number]] Collected:  11/11/15 0811    Order Status:  Completed Updated:  11/11/15 1318    SCANNED CARDIAC RHYTHM STRIP [[id number]] Collected:  11/11/15 0225    Order  Status:  Completed Updated:  11/11/15 0229    SCANNED CARDIAC RHYTHM STRIP [[id number]] Collected:  11/10/15 2223    Order Status:  Completed Updated:  11/10/15 2226    SCANNED CARDIAC RHYTHM STRIP [[id number]] Collected:  11/10/15 1630    Order Status:  Completed Updated:  11/10/15 1632    SCANNED CARDIAC RHYTHM STRIP [[id number]] Collected:  11/10/15 1630    Order Status:  Completed Updated:  11/10/15 1632    SCANNED CARDIAC RHYTHM STRIP [[id number]] Collected:  11/10/15 1411    Order Status:  Completed Updated:  11/10/15 1417    EKG, 12 LEAD, INITIAL [[id number]] Collected:  11/09/15 1535    Order Status:  Completed Updated:  11/10/15 0949     Ventricular Rate 80 BPM      Atrial Rate 80 BPM      P-R Interval 152 ms      QRS Duration 106 ms       Q-T Interval 428 ms      QTC Calculation (Bezet) 493 ms      Calculated P Axis 35 degrees      Calculated R Axis 20 degrees      Calculated T Axis -65 degrees      Diagnosis --     Sinus rhythm with premature atrial complexes  Possible Left atrial enlargement  ST & T wave abnormality, consider inferolateral ischemia  Prolonged QT  Abnormal ECG  When compared with ECG of 07-Apr-1997 22:10,  Anterolateral q waves now present.    Confirmed by Avon Gully, MD, Jomarie Longs [phone]) on 11/10/2015 9:48:53 AM      SCANNED CARDIAC RHYTHM STRIP [[id number]] Collected:  11/10/15 0847    Order Status:  Completed Updated:  11/10/15 0925          Consults: Cardiology,Nephrology    Treatment Team: Treatment Team: Attending Provider: Hazel Sams, MD; Consulting Provider: Donell Beers, MD; Consulting Provider: Hazel Sams, MD; Occupational Therapist: Jill Side, OT; Consulting Provider: Vernard Gambles, MD; Transitional Nurse Navigator: Lasandra Beech    Significant Diagnostic Studies:Cardiac cath no significant CAD  Severe LVH and diastolic dysfunction    Hospital Course:   Patient is being admitted to PCU for acute non-ST elevation MI, patient be started on aspirin, beta blocker, hydralazine and Lipitor. For hypertensive emergency patient be started on nitroglycerin drip which will be titrated to keep systolic blood pressure less than 160. Patient be started on heparin drip,  We will avoid ACE inhibitor due to acute kidney injury, cardiology consult done, we'll order an echocardiogram, will check lipid and hemoglobin A1c in the morning, further management depending on patient's progress and recommendations from cardiology. If renal function improves patient will need cardiac catheterization on Monday.  ??  Patient be continued on current therapy, patient had cardiac catheterization done yesterday which showed mild nonobstructive coronary artery disease normal ejection fraction patient had severe left  ventricular hypertrophy and diastolic dysfunction,  Patient is being discharged home.detail discharge summary  Will be dictated.  Dragon medical dictation software was used for portions of this report. Unintended errors may occur.     Hazel Sams, MD  November 16, 2015        Total time spent: Over 30 min.

## 2015-11-16 NOTE — Progress Notes (Signed)
[facility]   CARDIOLOGY PROGRESS NOTE    Seen in follow up of minimal troponin leevation in setting of malignant HTN with mild nonobs CAD on cath suggesting demand ischemia and LVEDP 24    Seen prior to discharge and discussed with dr Rondel Jumbo yesterday    RECS:  Agree with dose of diuretic  Aggressive secondary prevention measures  Asa  Statin   B Blocker  Consider HCTZ  Needs f/u Cr    Dispo when ok with Renal  Trop elevation likely demand ischemia      STATUS OF ACTIVE PROBLEMS AND EVENTS OVERNIGHT  Radial access site well healed  nonobs CAD on cath   HTN better  Cr stable after cath at 2  Dyslipidmeia stable on lipitor    IMP:    1.  Troponin elevation due to non-ST-elevation myocardial infarction versus demand ischemia from malignant hypertension.  2.  Malignant hypertension, previously untreated.  3.  Severe left ventricular hypertrophy.  4.  Dyslipidemia.  5.  Chronic kidney disease, baseline creatinine 2.1.  6.  Prediabetes with metabolic syndrome.  7.  Degenerative joint disease.  8.  ALLERGY TO PENICILLIN.  9.  Elevated body mass index to 41.    ASSESSMENT:  Gregory Soto is a 55 y.o. male    Active Problems:    NSTEMI (non-ST elevated myocardial infarction) (HCC) (11/09/2015)        SUBJECTIVE:  No CP or SOB    ROS: No headache or visual changes , no cough, no abd pain, no fevers, no rash    VS:   Visit Vitals   ??? BP 147/67 (BP 1 Location: Left arm, BP Patient Position: Sitting)   ??? Pulse (!) 53   ??? Temp 97.5 ??F (36.4 ??C)   ??? Resp 16   ??? Ht 5\' 9"  (1.753 m)   ??? Wt 119.5 kg (263 lb 7.2 oz)   ??? SpO2 96%   ??? BMI 38.9 kg/m2     Last 3 Recorded Weights in this Encounter    11/14/15 0311 11/15/15 0329 11/16/15 0120   Weight: 127 kg (279 lb 15.8 oz) 128 kg (282 lb 3 oz) 119.5 kg (263 lb 7.2 oz)      Body mass index is 38.9 kg/(m^2).     Intake/Output Summary (Last 24 hours) at 11/16/15 1530  Last data filed at 11/16/15 1201   Gross per 24 hour   Intake           1352.5 ml    Output             2325 ml   Net           -972.5 ml     TELE personally reviewed by me: nsr     EXAM:  General:  Skin warm, perfusion adequate, No acute distress WD/WN   Neck: JVD is absent, trachea midline  Lungs: clear, no rales, no rhonchi , normal effort, no accessory muscle use  Cardiac:  Normal S1 and S2,  Regular Rhythm, no murmur, no Rub, PMI nl  Abdomen: soft, Nontender, nl bowel sounds, no hepatomegaly  Ext: Edema is absent, warm perfused  Neuro: Alert and oriented; Nonfocal, moves all 4 extremities     Labs:  Basic Metabolic Profile   Recent Labs      11/16/15   0354  11/15/15   0415  11/14/15   0329   NA  138  137  137   K  4.1  4.2  4.1   CL  109*  109*  107   CO2  18*  20*  21   BUN  31*  32*  32*   CREA  2.0*  2.1*  2.0*   GLU  97  102  109*   CA  8.6  8.5  8.4*   PHOS  3.6  3.8   --         CBC w/Diff    Recent Labs      11/16/15   0354   WBC  8.2   RBC  4.15   HGB  12.5   HCT  37.6   MCV  90.6   MCH  30.1   MCHC  33.2   PLT  174    Recent Labs      11/16/15   0354   GRANS  74.4*   LYMPH  13.9*   MONOS  9.6   EOS  1.5   BASOS  0.2        Cardiac Enzymes   No results for input(s): CPK, CKMB, CKNDX, TROPT in the last 72 hours.     Coagulation   Recent Labs      11/15/15   1004   PTP  14.2*   INR  1.2*        Medications:    Current Facility-Administered Medications   Medication Dose Route Frequency   ??? sodium chloride (NS) flush 5-10 mL  5-10 mL IntraVENous Q8H   ??? heparin (porcine) injection 5,000 Units  5,000 Units SubCUTAneous Q8H   ??? cyanocobalamin (VITAMIN B12) injection 1,000 mcg  1,000 mcg IntraMUSCular DAILY   ??? ergocalciferol (ERGOCALCIFEROL) capsule 50,000 Units  50,000 Units Oral Q7D   ??? isosorbide mononitrate ER (IMDUR) tablet 60 mg  60 mg Oral DAILY   ??? carvedilol (COREG) tablet 25 mg  25 mg Oral BID WITH MEALS   ??? amLODIPine (NORVASC) tablet 10 mg  10 mg Oral DAILY   ??? hydrALAZINE (APRESOLINE) tablet 100 mg  100 mg Oral TID   ??? aspirin delayed-release tablet 81 mg  81 mg Oral DAILY    ??? atorvastatin (LIPITOR) tablet 80 mg  80 mg Oral QHS   ??? cloNIDine (CATAPRES) 0.3 mg/24 hr patch 1 Patch  1 Patch TransDERmal Q7D          Brinleigh Tew, MD  [facility]

## 2015-11-16 NOTE — Progress Notes (Signed)
In Patient Progress note      Admit Date: 11/09/2015          Impression:       1.  CKD3 - secondary to unrecognized and untreated HTN.  Cr level stable post heart cath yesterday -> 2mg /dl this am.  This is his baseline.  He was prophylaxed with fluids/mucomyst prior to angiogram and Cards used a very low amount of contrast -> thus, not surprised that kidneys are very stable today.  No evidence of contrast nephropathy.    2.  HTN - BP's were extremely high on presentation.  Renal artery PVL's negative for RAS.  Catecholamines, renin, aldo levels remain pending.  BP's looking very good at this time, much improved from admission.    3.  Elevated troponin - likely due to hypertensive heart disease.  Angiogram yesterday showed mild, non-obstructive CAD.  He looks well, no recurrence of CP/SOB here.        Plan:     D/C IVF's.      Will give single dose of lasix 40mg  IV x 1 now.  PCWP was a little high on cardiac cath yesterday and he does have some mild-moderate peripheral edema as well.    Continue present anti-HTN meds which are working well.    I think he'd benefit from ACE/ARB and likely thiazide diuretic as well for HTN.  However, I'd be reluctant to start this now since he lives out of town and doesn't presently have a primary care doctor.  These meds can clearly pose risks if we're not assured of close f/u.  It'd be best for him to establish himself with PCP (also cards, nephrology) in [place] -> and these meds can be considered later when he's more stable.     He looks well.  He's stable for d/c today.    Will sign off, available as needed.  Thanks.          Subjective:     Feels well.             Current Facility-Administered Medications:   ???  sodium chloride (NS) flush 5-10 mL, 5-10 mL, IntraVENous, Q8H, Ramin Alimard, MD, 10 mL at 11/16/15 0543  ???  sodium chloride (NS) flush 5-10 mL, 5-10 mL, IntraVENous, PRN, Ramin Alimard, MD  ???  morphine injection 2 mg, 2 mg, IntraVENous, Q3H PRN, Cyndia Diver, MD   ???  labetalol (NORMODYNE;TRANDATE) injection 10 mg, 10 mg, IntraVENous, Q10MIN PRN, Cyndia Diver, MD  ???  hydrALAZINE (APRESOLINE) 20 mg/mL injection 20 mg, 20 mg, IntraVENous, Q30MIN PRN, Ramin Alimard, MD  ???  heparin (porcine) injection 5,000 Units, 5,000 Units, SubCUTAneous, Q8H, Ramin Alimard, MD, 5,000 Units at 11/16/15 0543  ???  0.9% sodium chloride infusion, 75 mL/hr, IntraVENous, CONTINUOUS, Vernard Gambles, MD, Last Rate: 75 mL/hr at 11/16/15 0704, 75 mL/hr at 11/16/15 0704  ???  cyanocobalamin (VITAMIN B12) injection 1,000 mcg, 1,000 mcg, IntraMUSCular, DAILY, Hazel Sams, MD, 1,000 mcg at 11/16/15 0935  ???  ergocalciferol (ERGOCALCIFEROL) capsule 50,000 Units, 50,000 Units, Oral, Q7D, Hazel Sams, MD, 50,000 Units at 11/10/15 0816  ???  isosorbide mononitrate ER (IMDUR) tablet 60 mg, 60 mg, Oral, DAILY, Hazel Sams, MD, 60 mg at 11/16/15 0935  ???  carvedilol (COREG) tablet 25 mg, 25 mg, Oral, BID WITH MEALS, Hazel Sams, MD, 25 mg at 11/16/15 0935  ???  amLODIPine (NORVASC) tablet 10 mg, 10 mg, Oral, DAILY, Hazel Sams, MD, 10 mg at 11/16/15 0935  ???  naloxone (  NARCAN) injection 0.1 mg, 0.1 mg, IntraVENous, PRN, Hazel Sams, MD  ???  [DISCONTINUED] acetaminophen (TYLENOL) tablet 650 mg, 650 mg, Oral, Q4H PRN **OR** [DISCONTINUED] acetaminophen (TYLENOL) solution 650 mg, 650 mg, Oral, Q4H PRN **OR** acetaminophen (TYLENOL) suppository 650 mg, 650 mg, Rectal, Q4H PRN, Hazel Sams, MD  ???  HYDROcodone-acetaminophen (NORCO) 5-325 mg per tablet 1 Tab, 1 Tab, Oral, Q4H PRN, Hazel Sams, MD  ???  morphine injection 2 mg, 2 mg, IntraVENous, Q4H PRN, Hazel Sams, MD  ???  ondansetron (ZOFRAN) injection 4 mg, 4 mg, IntraVENous, Q4H PRN, Hazel Sams, MD  ???  hydrALAZINE (APRESOLINE) 20 mg/mL injection 20 mg, 20 mg, IntraVENous, Q6H PRN, Hazel Sams, MD, 20 mg at 11/09/15 1818  ???  hydrALAZINE (APRESOLINE) tablet 100 mg, 100 mg, Oral, TID, Hazel Sams, MD, 100 mg at 11/16/15 0935   ???  aspirin delayed-release tablet 81 mg, 81 mg, Oral, DAILY, Hazel Sams, MD, 81 mg at 11/16/15 0935  ???  atorvastatin (LIPITOR) tablet 80 mg, 80 mg, Oral, QHS, Hazel Sams, MD, 80 mg at 11/15/15 2132  ???  cloNIDine (CATAPRES) 0.3 mg/24 hr patch 1 Patch, 1 Patch, TransDERmal, Q7D, Hazel Sams, MD, 1 Patch at 11/09/15 1841  ???  pneumococcal 13 val conj dip (PREVNAR-13) injection 0.5 mL, 0.5 mL, IntraMUSCular, PRIOR TO DISCHARGE, Hazel Sams, MD        Objective:     Visit Vitals   ??? BP 151/71 (BP 1 Location: Left arm, BP Patient Position: Sitting)   ??? Pulse (!) 55   ??? Temp 97.4 ??F (36.3 ??C)   ??? Resp 16   ??? Ht  (1.753 m)   ??? Wt 119.5 kg (263 lb 7.2 oz)   ??? SpO2 96%   ??? BMI 38.9 kg/m2         Intake/Output Summary (Last 24 hours) at 11/16/15 1041  Last data filed at 11/16/15 0940   Gross per 24 hour   Intake          1173.75 ml   Output             1950 ml   Net          -776.25 ml       Physical Exam:     Awake, bilateral hearing aids.  Clear lungs.  RRR.  1+ edema over shins.  No foley.  Abdomen soft, benign.        Data Review:    Recent Labs      11/16/15   0354   WBC  8.2   RBC  4.15   HCT  37.6   MCV  90.6   MCH  30.1   MCHC  33.2     Recent Labs      11/16/15   0354  11/15/15   0415  11/14/15   0329   BUN  31*  32*  32*   CREA  2.0*  2.1*  2.0*   CA  8.6  8.5  8.4*   ALB  3.1*  3.1*   --    K  4.1  4.2  4.1   NA  138  137  137   CL  109*  109*  107   CO2  18*  20*  21   PHOS  3.6  3.8   --    GLU  97  102  109*       Vernard Gambles, MD  Cell [phone]  Pager [phone]

## 2015-11-16 NOTE — Other (Signed)
Bedside and Verbal shift change report given to Sherren Kerns, RN (oncoming nurse) by Fayrene Fearing Temi,RN (offgoing nurse). Report included the following information SBAR, MAR, Recent Results and Cardiac Rhythm SB.

## 2015-11-16 NOTE — Progress Notes (Signed)
Progress Note    Patient: Gregory Soto   Age:  55 y.o.  DOA: 11/09/2015   Admit Dx / CC: NSTEMI (non-ST elevated myocardial infarction) (HCC)  NSTEMI (non-ST elevated myocardial infarction) (HCC)  LOS:  LOS: 7 days     Active Problems   Active Problems:    NSTEMI (non-ST elevated myocardial infarction) (HCC) (11/09/2015)      Additional Assessments   1. Acute non-ST elevation MI  2. Hypertensive emergency  3. Deafness  4. Obesity  5. Acute kidney injury likely chronic but there is no recent blood work available  6. Left knee sprain   7. Prediabetes/metabolic syndrome  8. Vitamin D deficiency  9. B12 deficiency  Plan:   Patient be continued on current therapy, patient had cardiac catheterization done yesterday which showed mild nonobstructive coronary artery disease normal ejection fraction patient had severe left ventricular hypertrophy and diastolic dysfunction, patient be continued on IV fluid for 1 more day, will repeat basic metabolic profile in the morning if creatinine stays around 2 he can be discharged home in the morning.  Subjective:   No new complaints today  Objective:     Visit Vitals   ??? BP 151/71 (BP 1 Location: Left arm, BP Patient Position: Sitting)   ??? Pulse (!) 55   ??? Temp 97.4 ??F (36.3 ??C)   ??? Resp 16   ??? Ht  (1.753 m)   ??? Wt 119.5 kg (263 lb 7.2 oz)   ??? SpO2 96%   ??? BMI 38.9 kg/m2       Physical Exam:  General appearance: No acute distress   Lungs: CTA bilaterally  Heart: RRR, S1, S2 normal, no murmur,  Extremities: No edema   Neurologic: Awake and alert    Intake and Output:  Current Shift:     Last three shifts:  08/02 1901 - 08/04 0700  In: 2577.8 [P.O.:340; I.V.:2237.8]  Out: 2250 [Urine:2250]    Lab/Data Reviewed:  Recent Results (from the past 48 hour(s))   PTT, CRRT PROTOCOL (PTT DRIP)    Collection Time: 11/14/15 10:04 AM   Result Value Ref Range    PTT drip 84.6 70.0 - 105.0 seconds   PTT, CRRT PROTOCOL (PTT DRIP)    Collection Time: 11/14/15  6:20 PM   Result Value Ref Range     PTT drip 103.1 70.0 - 105.0 seconds   RENAL FUNCTION PANEL    Collection Time: 11/15/15  4:15 AM   Result Value Ref Range    Sodium 137 136 - 145 mEq/L    Potassium 4.2 3.5 - 5.1 mEq/L    Chloride 109 (H) 98 - 107 mEq/L    CO2 20 (L) 21 - 32 mEq/L    Glucose 102 74 - 106 mg/dl    BUN 32 (H) 7 - 25 mg/dl    Creatinine 2.1 (H) 0.6 - 1.3 mg/dl    GFR est AA 16.1      GFR est non-AA 35      Calcium 8.5 8.5 - 10.1 mg/dl    Albumin 3.1 (L) 3.4 - 5.0 gm/dl    Phosphorus 3.8 2.5 - 4.9 mg/dl   PTT, CRRT PROTOCOL (PTT DRIP)    Collection Time: 11/15/15  4:15 AM   Result Value Ref Range    PTT drip 123.8 (HH) 70.0 - 105.0 seconds   POC PT/INR    Collection Time: 11/15/15 10:04 AM   Result Value Ref Range    Prothrombin time 14.2 (  H) 0.0 - 14.0 seconds    INR 1.2 (H) 0.0 - 1.1     POC ACTIVATED CLOTTING TIME    Collection Time: 11/15/15 10:53 AM   Result Value Ref Range    Activated clotting time (POC) 156 (H) 79 - 149 seconds   PTT, CRRT PROTOCOL (PTT DRIP)    Collection Time: 11/15/15  1:06 PM   Result Value Ref Range    PTT drip 34.8 (LL) 70.0 - 105.0 seconds   CBC WITH AUTOMATED DIFF    Collection Time: 11/16/15  3:54 AM   Result Value Ref Range    WBC 8.2 4.0 - 11.0 1000/mm3    RBC 4.15 3.80 - 5.70 M/uL    HGB 12.5 12.4 - 17.2 gm/dl    HCT 75.6 43.3 - 29.5 %    MCV 90.6 80.0 - 98.0 fL    MCH 30.1 23.0 - 34.6 pg    MCHC 33.2 30.0 - 36.0 gm/dl    PLATELET 188 416 - 606 1000/mm3    MPV 11.6 (H) 6.0 - 10.0 fL    RDW-SD 47.7 (H) 35.1 - 43.9      NRBC 0 0 - 0      IMMATURE GRANULOCYTES 0.4 0.0 - 3.0 %    NEUTROPHILS 74.4 (H) 34 - 64 %    LYMPHOCYTES 13.9 (L) 28 - 48 %    MONOCYTES 9.6 1 - 13 %    EOSINOPHILS 1.5 0 - 5 %    BASOPHILS 0.2 0 - 3 %   RENAL FUNCTION PANEL    Collection Time: 11/16/15  3:54 AM   Result Value Ref Range    Sodium 138 136 - 145 mEq/L    Potassium 4.1 3.5 - 5.1 mEq/L    Chloride 109 (H) 98 - 107 mEq/L    CO2 18 (L) 21 - 32 mEq/L    Glucose 97 74 - 106 mg/dl    BUN 31 (H) 7 - 25 mg/dl     Creatinine 2.0 (H) 0.6 - 1.3 mg/dl    GFR est AA 30.1      GFR est non-AA 37      Calcium 8.6 8.5 - 10.1 mg/dl    Albumin 3.1 (L) 3.4 - 5.0 gm/dl    Phosphorus 3.6 2.5 - 4.9 mg/dl       Imaging:  No results found.    Medications Reviewed:  Current Facility-Administered Medications   Medication Dose Route Frequency   ??? sodium chloride (NS) flush 5-10 mL  5-10 mL IntraVENous Q8H   ??? sodium chloride (NS) flush 5-10 mL  5-10 mL IntraVENous PRN   ??? morphine injection 2 mg  2 mg IntraVENous Q3H PRN   ??? labetalol (NORMODYNE;TRANDATE) injection 10 mg  10 mg IntraVENous Q10MIN PRN   ??? hydrALAZINE (APRESOLINE) 20 mg/mL injection 20 mg  20 mg IntraVENous Q30MIN PRN   ??? heparin (porcine) injection 5,000 Units  5,000 Units SubCUTAneous Q8H   ??? 0.9% sodium chloride infusion  75 mL/hr IntraVENous CONTINUOUS   ??? acetylcysteine (MUCOMYST) 200 mg/mL (20 %) solution 600 mg  600 mg Oral BID   ??? cyanocobalamin (VITAMIN B12) injection 1,000 mcg  1,000 mcg IntraMUSCular DAILY   ??? ergocalciferol (ERGOCALCIFEROL) capsule 50,000 Units  50,000 Units Oral Q7D   ??? isosorbide mononitrate ER (IMDUR) tablet 60 mg  60 mg Oral DAILY   ??? carvedilol (COREG) tablet 25 mg  25 mg Oral BID WITH MEALS   ??? amLODIPine (NORVASC) tablet 10  mg  10 mg Oral DAILY   ??? naloxone (NARCAN) injection 0.1 mg  0.1 mg IntraVENous PRN   ??? acetaminophen (TYLENOL) suppository 650 mg  650 mg Rectal Q4H PRN   ??? HYDROcodone-acetaminophen (NORCO) 5-325 mg per tablet 1 Tab  1 Tab Oral Q4H PRN   ??? morphine injection 2 mg  2 mg IntraVENous Q4H PRN   ??? ondansetron (ZOFRAN) injection 4 mg  4 mg IntraVENous Q4H PRN   ??? hydrALAZINE (APRESOLINE) 20 mg/mL injection 20 mg  20 mg IntraVENous Q6H PRN   ??? hydrALAZINE (APRESOLINE) tablet 100 mg  100 mg Oral TID   ??? aspirin delayed-release tablet 81 mg  81 mg Oral DAILY   ??? atorvastatin (LIPITOR) tablet 80 mg  80 mg Oral QHS   ??? cloNIDine (CATAPRES) 0.3 mg/24 hr patch 1 Patch  1 Patch TransDERmal Q7D    ??? pneumococcal 13 val conj dip (PREVNAR-13) injection 0.5 mL  0.5 mL IntraMUSCular PRIOR TO DISCHARGE         Dragon medical dictation software was used for portions of this report. Unintended errors may occur.   Hazel Sams, MD  P# [phone]  November 16, 2015

## 2015-11-17 LAB — PROTEIN ELECTROPHORESIS
ALPHA-2 GLOBULIN: 0.8 g/dL (ref 0.5–0.9)
Abnormal Protein Band 1: NOT DETECTED
Albumin: 3.4 g/dL — ABNORMAL LOW (ref 3.8–4.8)
Alpha-1-globulin: 0.4 g/dL — ABNORMAL HIGH (ref 0.2–0.3)
Beta 1 Globulin: 0.4 g/dL (ref 0.4–0.6)
Beta 2 Globulin: 0.3 g/dL (ref 0.2–0.5)
Gamma globulin: 0.9 g/dL (ref 0.8–1.7)
Protein, total: 6 g/dL — ABNORMAL LOW (ref 6.1–8.1)

## 2015-11-17 LAB — CATECHOLAMINES, PLASMA
Catecholamines Total: 1174 pg/mL
Dopamine, plasma: 57 pg/mL
Epinephrine, plasma: 66 pg/mL
Norepinephrine, plasma: 1108 pg/mL

## 2015-11-18 LAB — METANEPHRINES, PLASMA
Metanephrine, free: 36 pg/mL (ref <=57)
Metanephrines, Total: 388 pg/mL — ABNORMAL HIGH (ref <=205)
Normetanephrine, free: 352 pg/mL — ABNORMAL HIGH (ref <=148)

## 2015-11-19 LAB — RENIN ACTIVITY: Renin Activity: 2.2 ng/mL/h (ref 0.25–5.82)

## 2015-11-20 LAB — PULMONARY FUNCTION TEST

## 2016-08-19 ENCOUNTER — Ambulatory Visit (INDEPENDENT_AMBULATORY_CARE_PROVIDER_SITE_OTHER): Payer: Self-pay | Admitting: Adult Health

## 2016-08-19 ENCOUNTER — Encounter: Payer: Self-pay | Admitting: Adult Health

## 2016-08-19 VITALS — BP 151/80 | HR 62 | Ht 70.0 in | Wt 251.4 lb

## 2016-08-19 DIAGNOSIS — E669 Obesity, unspecified: Secondary | ICD-10-CM | POA: Diagnosis not present

## 2016-08-19 DIAGNOSIS — Z0000 Encounter for general adult medical examination without abnormal findings: Secondary | ICD-10-CM | POA: Insufficient documentation

## 2016-08-19 DIAGNOSIS — E1159 Type 2 diabetes mellitus with other circulatory complications: Secondary | ICD-10-CM | POA: Insufficient documentation

## 2016-08-19 DIAGNOSIS — I10 Essential (primary) hypertension: Secondary | ICD-10-CM

## 2016-08-19 DIAGNOSIS — Z833 Family history of diabetes mellitus: Secondary | ICD-10-CM

## 2016-08-19 DIAGNOSIS — E1169 Type 2 diabetes mellitus with other specified complication: Secondary | ICD-10-CM

## 2016-08-19 DIAGNOSIS — R5383 Other fatigue: Secondary | ICD-10-CM

## 2016-08-19 DIAGNOSIS — E119 Type 2 diabetes mellitus without complications: Secondary | ICD-10-CM | POA: Insufficient documentation

## 2016-08-19 MED ORDER — AMLODIPINE BESYLATE 10 MG PO TABS: 10.0000 mg | ORAL_TABLET | Freq: Every day | ORAL | 2 refills | Status: DC

## 2016-08-19 NOTE — Assessment & Plan Note (Signed)
Heart healthy diet and increase regular movement to at least 3130mins/5 times a week.

## 2016-08-19 NOTE — Patient Instructions (Signed)
Hypertension Hypertension, commonly called high blood pressure, is when the force of blood pumping through the arteries is too strong. The arteries are the blood vessels that carry blood from the heart throughout the body. Hypertension forces the heart to work harder to pump blood and may cause arteries to become narrow or stiff. Having untreated or uncontrolled hypertension can cause heart attacks, strokes, kidney disease, and other problems. A blood pressure reading consists of a higher number over a lower number. Ideally, your blood pressure should be below 120/80. The first ("top") number is called the systolic pressure. It is a measure of the pressure in your arteries as your heart beats. The second ("bottom") number is called the diastolic pressure. It is a measure of the pressure in your arteries as the heart relaxes. What are the causes? The cause of this condition is not known. What increases the risk? Some risk factors for high blood pressure are under your control. Others are not. Factors you can change   Smoking.  Having type 2 diabetes mellitus, high cholesterol, or both.  Not getting enough exercise or physical activity.  Being overweight.  Having too much fat, sugar, calories, or salt (sodium) in your diet.  Drinking too much alcohol. Factors that are difficult or impossible to change   Having chronic kidney disease.  Having a family history of high blood pressure.  Age. Risk increases with age.  Race. You may be at higher risk if you are African-American.  Gender. Men are at higher risk than women before age 45. After age 65, women are at higher risk than men.  Having obstructive sleep apnea.  Stress. What are the signs or symptoms? Extremely high blood pressure (hypertensive crisis) may cause:  Headache.  Anxiety.  Shortness of breath.  Nosebleed.  Nausea and vomiting.  Severe chest pain.  Jerky movements you cannot control (seizures). How is this  diagnosed? This condition is diagnosed by measuring your blood pressure while you are seated, with your arm resting on a surface. The cuff of the blood pressure monitor will be placed directly against the skin of your upper arm at the level of your heart. It should be measured at least twice using the same arm. Certain conditions can cause a difference in blood pressure between your right and left arms. Certain factors can cause blood pressure readings to be lower or higher than normal (elevated) for a short period of time:  When your blood pressure is higher when you are in a health care provider's office than when you are at home, this is called white coat hypertension. Most people with this condition do not need medicines.  When your blood pressure is higher at home than when you are in a health care provider's office, this is called masked hypertension. Most people with this condition may need medicines to control blood pressure. If you have a high blood pressure reading during one visit or you have normal blood pressure with other risk factors:  You may be asked to return on a different day to have your blood pressure checked again.  You may be asked to monitor your blood pressure at home for 1 week or longer. If you are diagnosed with hypertension, you may have other blood or imaging tests to help your health care provider understand your overall risk for other conditions. How is this treated? This condition is treated by making healthy lifestyle changes, such as eating healthy foods, exercising more, and reducing your alcohol intake. Your health   care provider may prescribe medicine if lifestyle changes are not enough to get your blood pressure under control, and if:  Your systolic blood pressure is above 130.  Your diastolic blood pressure is above 80. Your personal target blood pressure may vary depending on your medical conditions, your age, and other factors. Follow these instructions  at home: Eating and drinking   Eat a diet that is high in fiber and potassium, and low in sodium, added sugar, and fat. An example eating plan is called the DASH (Dietary Approaches to Stop Hypertension) diet. To eat this way:  Eat plenty of fresh fruits and vegetables. Try to fill half of your plate at each meal with fruits and vegetables.  Eat whole grains, such as whole wheat pasta, brown rice, or whole grain bread. Fill about one quarter of your plate with whole grains.  Eat or drink low-fat dairy products, such as skim milk or low-fat yogurt.  Avoid fatty cuts of meat, processed or cured meats, and poultry with skin. Fill about one quarter of your plate with lean proteins, such as fish, chicken without skin, beans, eggs, and tofu.  Avoid premade and processed foods. These tend to be higher in sodium, added sugar, and fat.  Reduce your daily sodium intake. Most people with hypertension should eat less than 1,500 mg of sodium a day.  Limit alcohol intake to no more than 1 drink a day for nonpregnant women and 2 drinks a day for men. One drink equals 12 oz of beer, 5 oz of wine, or 1 oz of hard liquor. Lifestyle   Work with your health care provider to maintain a healthy body weight or to lose weight. Ask what an ideal weight is for you.  Get at least 30 minutes of exercise that causes your heart to beat faster (aerobic exercise) most days of the week. Activities may include walking, swimming, or biking.  Include exercise to strengthen your muscles (resistance exercise), such as pilates or lifting weights, as part of your weekly exercise routine. Try to do these types of exercises for 30 minutes at least 3 days a week.  Do not use any products that contain nicotine or tobacco, such as cigarettes and e-cigarettes. If you need help quitting, ask your health care provider.  Monitor your blood pressure at home as told by your health care provider.  Keep all follow-up visits as told by  your health care provider. This is important. Medicines   Take over-the-counter and prescription medicines only as told by your health care provider. Follow directions carefully. Blood pressure medicines must be taken as prescribed.  Do not skip doses of blood pressure medicine. Doing this puts you at risk for problems and can make the medicine less effective.  Ask your health care provider about side effects or reactions to medicines that you should watch for. Contact a health care provider if:  You think you are having a reaction to a medicine you are taking.  You have headaches that keep coming back (recurring).  You feel dizzy.  You have swelling in your ankles.  You have trouble with your vision. Get help right away if:  You develop a severe headache or confusion.  You have unusual weakness or numbness.  You feel faint.  You have severe pain in your chest or abdomen.  You vomit repeatedly.  You have trouble breathing. Summary  Hypertension is when the force of blood pumping through your arteries is too strong. If this condition is   not controlled, it may put you at risk for serious complications.  Your personal target blood pressure may vary depending on your medical conditions, your age, and other factors. For most people, a normal blood pressure is less than 120/80.  Hypertension is treated with lifestyle changes, medicines, or a combination of both. Lifestyle changes include weight loss, eating a healthy, low-sodium diet, exercising more, and limiting alcohol. This information is not intended to replace advice given to you by your health care provider. Make sure you discuss any questions you have with your health care provider. Document Released: 03/31/2005 Document Revised: 02/27/2016 Document Reviewed: 02/27/2016 Elsevier Interactive Patient Education  2017 [place]. Heart-Healthy Eating Plan Many factors influence your heart health, including eating and  exercise habits. Heart (coronary) risk increases with abnormal blood fat (lipid) levels. Heart-healthy meal planning includes limiting unhealthy fats, increasing healthy fats, and making other small dietary changes. This includes maintaining a healthy body weight to help keep lipid levels within a normal range. What is my plan? Your health care provider recommends that you:  Get no more than _________% of the total calories in your daily diet from fat.  Limit your intake of saturated fat to less than _________% of your total calories each day.  Limit the amount of cholesterol in your diet to less than _________ mg per day. What types of fat should I choose?  Choose healthy fats more often. Choose monounsaturated and polyunsaturated fats, such as olive oil and canola oil, flaxseeds, walnuts, almonds, and seeds.  Eat more omega-3 fats. Good choices include salmon, mackerel, sardines, tuna, flaxseed oil, and ground flaxseeds. Aim to eat fish at least two times each week.  Limit saturated fats. Saturated fats are primarily found in animal products, such as meats, butter, and cream. Plant sources of saturated fats include palm oil, palm kernel oil, and coconut oil.  Avoid foods with partially hydrogenated oils in them. These contain trans fats. Examples of foods that contain trans fats are stick margarine, some tub margarines, cookies, crackers, and other baked goods. What general guidelines do I need to follow?  Check food labels carefully to identify foods with trans fats or high amounts of saturated fat.  Fill one half of your plate with vegetables and green salads. Eat 4-5 servings of vegetables per day. A serving of vegetables equals 1 cup of raw leafy vegetables,  cup of raw or cooked cut-up vegetables, or  cup of vegetable juice.  Fill one fourth of your plate with whole grains. Look for the word "whole" as the first word in the ingredient list.  Fill one fourth of your plate with lean  protein foods.  Eat 4-5 servings of fruit per day. A serving of fruit equals one medium whole fruit,  cup of dried fruit,  cup of fresh, frozen, or canned fruit, or  cup of 100% fruit juice.  Eat more foods that contain soluble fiber. Examples of foods that contain this type of fiber are apples, broccoli, carrots, beans, peas, and barley. Aim to get 20-30 g of fiber per day.  Eat more home-cooked food and less restaurant, buffet, and fast food.  Limit or avoid alcohol.  Limit foods that are high in starch and sugar.  Avoid fried foods.  Cook foods by using methods other than frying. Baking, boiling, grilling, and broiling are all great options. Other fat-reducing suggestions include:  Removing the skin from poultry.  Removing all visible fats from meats.  Skimming the fat off of stews,  soups, and gravies before serving them.  Steaming vegetables in water or broth.  Lose weight if you are overweight. Losing just 5-10% of your initial body weight can help your overall health and prevent diseases such as diabetes and heart disease.  Increase your consumption of nuts, legumes, and seeds to 4-5 servings per week. One serving of dried beans or legumes equals  cup after being cooked, one serving of nuts equals 1 ounces, and one serving of seeds equals  ounce or 1 tablespoon.  You may need to monitor your salt (sodium) intake, especially if you have high blood pressure. Talk with your health care provider or dietitian to get more information about reducing sodium. What foods can I eat? Grains   Breads, including [place], white, pita, wheat, raisin, rye, oatmeal, and [place]. Tortillas that are neither fried nor made with lard or trans fat. Low-fat rolls, including hotdog and hamburger buns and English muffins. Biscuits. Muffins. Waffles. Pancakes. Light popcorn. Whole-grain cereals. Flatbread. Melba toast. Pretzels. Breadsticks. Rusks. Low-fat snacks and crackers, including oyster,  saltine, matzo, graham, animal, and rye. Rice and pasta, including brown rice and those that are made with whole wheat. Vegetables  All vegetables. Fruits  All fruits, but limit coconut. Meats and Other Protein Sources  Lean, well-trimmed beef, veal, pork, and lamb. Chicken and [place] without skin. All fish and shellfish. Wild duck, rabbit, pheasant, and venison. Egg whites or low-cholesterol egg substitutes. Dried beans, peas, lentils, and tofu.Seeds and most nuts. Dairy  Low-fat or nonfat cheeses, including ricotta, string, and mozzarella. Skim or 1% milk that is liquid, powdered, or evaporated. Buttermilk that is made with low-fat milk. Nonfat or low-fat yogurt. Beverages  Mineral water. Diet carbonated beverages. Sweets and Desserts  Sherbets and fruit ices. Honey, jam, marmalade, jelly, and syrups. Meringues and gelatins. Pure sugar candy, such as hard candy, jelly beans, gumdrops, mints, marshmallows, and small amounts of dark chocolate. [employer] Eat all sweets and desserts in moderation. Fats and Oils  Nonhydrogenated (trans-free) margarines. Vegetable oils, including soybean, sesame, sunflower, olive, peanut, safflower, corn, canola, and cottonseed. Salad dressings or mayonnaise that are made with a vegetable oil. Limit added fats and oils that you use for cooking, baking, salads, and as spreads. Other  Cocoa powder. Coffee and tea. All seasonings and condiments. The items listed above may not be a complete list of recommended foods or beverages. Contact your dietitian for more options.  What foods are not recommended? Grains  Breads that are made with saturated or trans fats, oils, or whole milk. Croissants. Butter rolls. Cheese breads. Sweet rolls. Donuts. Buttered popcorn. Chow mein noodles. High-fat crackers, such as cheese or butter crackers. Meats and Other Protein Sources  Fatty meats, such as hotdogs, short ribs, sausage, spareribs, bacon, ribeye roast or steak, and  mutton. High-fat deli meats, such as salami and bologna. Caviar. Domestic duck and goose. Organ meats, such as kidney, liver, sweetbreads, brains, gizzard, chitterlings, and heart. Dairy  Cream, sour cream, cream cheese, and creamed cottage cheese. Whole milk cheeses, including blue (bleu), [place], [place], [place], American, [place], Swiss, [place], [place], and [place]. Whole or 2% milk that is liquid, evaporated, or condensed. Whole buttermilk. Cream sauce or high-fat cheese sauce. Yogurt that is made from whole milk. Beverages  Regular sodas and drinks with added sugar. Sweets and Desserts  Frosting. Pudding. Cookies. Cakes other than angel food cake. Candy that has milk chocolate or white chocolate, hydrogenated fat, butter, coconut, or unknown ingredients. Buttered syrups. Full-fat  ice cream or ice cream drinks. Fats and Oils  Gravy that has suet, meat fat, or shortening. Cocoa butter, hydrogenated oils, palm oil, coconut oil, palm kernel oil. These can often be found in baked products, candy, fried foods, nondairy creamers, and whipped toppings. Solid fats and shortenings, including bacon fat, salt pork, lard, and butter. Nondairy cream substitutes, such as coffee creamers and sour cream substitutes. Salad dressings that are made of unknown oils, cheese, or sour cream. The items listed above may not be a complete list of foods and beverages to avoid. Contact your dietitian for more information.  This information is not intended to replace advice given to you by your health care provider. Make sure you discuss any questions you have with your health care provider. Document Released: 01/08/2008 Document Revised: 10/19/2015 Document Reviewed: 09/22/2013 Elsevier Interactive Patient Education  2017 [employer]   Continue all medications as directed. Follow Heart Healthy Diet. Increase regular exercise-GREAT JOB on the sat am work-outs! Please schedule fasting lab nurse visit at your  convenience. Annual follow-up, sooner if needed.

## 2016-08-19 NOTE — Progress Notes (Signed)
Subjective:    Patient ID: Stanley Cook, male    DOB: [birth year], 56 y.o.   MRN: [id number]  HPI :  Stanley Cook is here to establish as a new pt.  He is a very pleasant 56 year old male.  PMH:  HTN, Obesity, and Hearing Loss.  He is compliant on all medications and denies SE.  He is a Editor, commissioning at [employer].  He started working out sat am's -cardio and wt. Training- began program in Dec '17.  Once school breaks for summer, he plans on increasing work-outs to 3 times/week.  He estimates to have lost > 9 lbs since Dec '17. Bil hearing aids since 2007. Sig Family Hx-HTN, T2D, HL  Patient Care Team    Relationship Specialty Notifications Start End  Malon Kindle, NP PCP - General Family Medicine  08/19/16     Patient Active Problem List   Diagnosis Date Noted  . Health care maintenance 08/19/2016  . Hypertension 08/19/2016     Past Medical History:  Diagnosis Date  . Hyperlipidemia   . Hypertension      History reviewed. No pertinent surgical history.   Family History  Problem Relation Age of Onset  . Diabetes Sister      History  Drug Use No     History  Alcohol Use No     History  Smoking Status  . Never Smoker  Smokeless Tobacco  . Never Used     Outpatient Encounter Prescriptions as of 08/19/2016  Medication Sig  . aspirin EC 81 MG tablet Take by mouth.  Marland Kitchen amLODipine (NORVASC) 10 MG tablet Take 1 tablet (10 mg total) by mouth daily.  Marland Kitchen labetalol (NORMODYNE) 100 MG tablet   . [DISCONTINUED] amLODipine (NORVASC) 10 MG tablet    No facility-administered encounter medications on file as of 08/19/2016.     Allergies: Penicillins  Body mass index is 36.07 kg/m.  Blood pressure (!) 151/80, pulse 62, height 5\' 10"  (1.778 m), weight 251 lb 6.4 oz (114 kg), SpO2 99 %.   Review of Systems  Constitutional: Positive for fatigue. Negative for activity change, appetite change, chills, diaphoresis, fever and unexpected weight change.  HENT: Positive for  hearing loss. Negative for congestion.   Eyes: Negative for visual disturbance.  Respiratory: Negative for cough, chest tightness, shortness of breath, wheezing and stridor.   Cardiovascular: Negative for chest pain, palpitations and leg swelling.  Gastrointestinal: Negative for abdominal distention, abdominal pain, blood in stool, constipation, diarrhea, nausea and vomiting.  Endocrine: Negative for cold intolerance, heat intolerance, polydipsia, polyphagia and polyuria.  Genitourinary: Negative for difficulty urinating, flank pain and hematuria.  Musculoskeletal: Negative for arthralgias, back pain, gait problem, joint swelling, myalgias, neck pain and neck stiffness.  Skin: Negative for color change, pallor, rash and wound.  Allergic/Immunologic: Negative for immunocompromised state.  Neurological: Negative for dizziness, tremors, weakness and headaches.  Hematological: Does not bruise/bleed easily.  Psychiatric/Behavioral: Negative for agitation and dysphoric mood. The patient is not nervous/anxious.        Objective:   Physical Exam  Constitutional: He is oriented to person, place, and time. He appears well-developed and well-nourished. No distress.  HENT:  Head: Normocephalic and atraumatic.  Right Ear: External ear normal. Decreased hearing is noted.  Left Ear: External ear normal. Decreased hearing is noted.  Nose: Nose normal. Right sinus exhibits no maxillary sinus tenderness and no frontal sinus tenderness. Left sinus exhibits no maxillary sinus tenderness and no frontal  sinus tenderness.  Mouth/Throat: Uvula is midline.  Eyes: Conjunctivae are normal. Pupils are equal, round, and reactive to light.  Neck: Normal range of motion. Neck supple.  Pulmonary/Chest: Effort normal and breath sounds normal. No respiratory distress. He has no wheezes. He has no rales. He exhibits no tenderness.  Abdominal: Soft. Bowel sounds are normal. He exhibits no distension and no mass. There is no  tenderness. There is no rebound and no guarding.  Musculoskeletal: Normal range of motion.  Lymphadenopathy:    He has no cervical adenopathy.  Neurological: He is alert and oriented to person, place, and time.  Skin: Skin is warm and dry. No rash noted. He is not diaphoretic. No erythema. No pallor.  Psychiatric: He has a normal mood and affect. His behavior is normal. Judgment and thought content normal.  Nursing note and vitals reviewed.         Assessment & Plan:   1. Health care maintenance   2. Essential hypertension   3. Obesity (BMI 35.0-39.9 without comorbidity)     Health care maintenance Continue all medications as directed. Follow Heart Healthy Diet. Increase regular exercise-GREAT JOB on the sat am work-outs! Please schedule fasting lab nurse visit at your convenience. Annual follow-up, sooner if needed.  Hypertension Continue Amlodipine 10mg  and Labetalol 100mg  daily (been taking since 01/20174). Follow Heart Healthy Diet. Increase regular exercise from 1 day a week to at least 5 days for 20mins.   Obesity (BMI 35.0-39.9 without comorbidity) Heart healthy diet and increase regular movement to at least 330mins/5 times a week.    FOLLOW-UP:  Return in about 1 year (around 08/19/2017) for Regular Follow Up.

## 2016-08-19 NOTE — Assessment & Plan Note (Signed)
Continue all medications as directed. Follow Heart Healthy Diet. Increase regular exercise-GREAT JOB on the sat am work-outs! Please schedule fasting lab nurse visit at your convenience. Annual follow-up, sooner if needed.

## 2016-08-19 NOTE — Assessment & Plan Note (Signed)
Continue Amlodipine 10mg  and Labetalol 100mg  daily (been taking since 01/20174). Follow Heart Healthy Diet. Increase regular exercise from 1 day a week to at least 5 days for 20mins.

## 2016-08-29 ENCOUNTER — Other Ambulatory Visit (INDEPENDENT_AMBULATORY_CARE_PROVIDER_SITE_OTHER): Payer: BC Managed Care – PPO

## 2016-08-29 DIAGNOSIS — E669 Obesity, unspecified: Secondary | ICD-10-CM

## 2016-08-29 DIAGNOSIS — I10 Essential (primary) hypertension: Secondary | ICD-10-CM | POA: Diagnosis not present

## 2016-08-29 DIAGNOSIS — R5383 Other fatigue: Secondary | ICD-10-CM

## 2016-08-29 DIAGNOSIS — Z833 Family history of diabetes mellitus: Secondary | ICD-10-CM

## 2016-08-30 LAB — COMPREHENSIVE METABOLIC PANEL
A/G RATIO: 1.9 (ref 1.2–2.2)
ALBUMIN: 4.2 g/dL (ref 3.5–5.5)
ALK PHOS: 65 IU/L (ref 39–117)
ALT: 16 IU/L (ref 0–44)
AST: 20 IU/L (ref 0–40)
BILIRUBIN TOTAL: 0.5 mg/dL (ref 0.0–1.2)
BUN / CREAT RATIO: 13 (ref 9–20)
BUN: 23 mg/dL (ref 6–24)
CHLORIDE: 103 mmol/L (ref 96–106)
CO2: 21 mmol/L (ref 18–29)
Calcium: 9.3 mg/dL (ref 8.7–10.2)
Creatinine, Ser: 1.74 mg/dL — ABNORMAL HIGH (ref 0.76–1.27)
GFR calc non Af Amer: 44 mL/min/{1.73_m2} — ABNORMAL LOW (ref >59)
GFR, EST AFRICAN AMERICAN: 51 mL/min/{1.73_m2} — AB (ref >59)
GLOBULIN, TOTAL: 2.2 g/dL (ref 1.5–4.5)
GLUCOSE: 100 mg/dL — AB (ref 65–99)
Potassium: 4.8 mmol/L (ref 3.5–5.2)
SODIUM: 143 mmol/L (ref 134–144)
TOTAL PROTEIN: 6.4 g/dL (ref 6.0–8.5)

## 2016-08-30 LAB — CBC WITH DIFFERENTIAL/PLATELET
BASOS: 0 %
Basophils Absolute: 0 10*3/uL (ref 0.0–0.2)
EOS (ABSOLUTE): 0.2 10*3/uL (ref 0.0–0.4)
EOS: 3 %
HEMATOCRIT: 46 % (ref 37.5–51.0)
HEMOGLOBIN: 15.6 g/dL (ref 13.0–17.7)
IMMATURE GRANS (ABS): 0 10*3/uL (ref 0.0–0.1)
Immature Granulocytes: 0 %
LYMPHS ABS: 1.3 10*3/uL (ref 0.7–3.1)
LYMPHS: 22 %
MCH: 30.8 pg (ref 26.6–33.0)
MCHC: 33.9 g/dL (ref 31.5–35.7)
MCV: 91 fL (ref 79–97)
MONOCYTES: 8 %
Monocytes Absolute: 0.5 10*3/uL (ref 0.1–0.9)
NEUTROS ABS: 4.1 10*3/uL (ref 1.4–7.0)
Neutrophils: 67 %
Platelets: 194 10*3/uL (ref 150–379)
RBC: 5.07 x10E6/uL (ref 4.14–5.80)
RDW: 14.1 % (ref 12.3–15.4)
WBC: 6.1 10*3/uL (ref 3.4–10.8)

## 2016-08-30 LAB — LIPID PANEL
CHOL/HDL RATIO: 6.2 ratio — AB (ref 0.0–5.0)
CHOLESTEROL TOTAL: 217 mg/dL — AB (ref 100–199)
HDL: 35 mg/dL — ABNORMAL LOW (ref >39)
LDL CALC: 142 mg/dL — AB (ref 0–99)
TRIGLYCERIDES: 198 mg/dL — AB (ref 0–149)
VLDL CHOLESTEROL CAL: 40 mg/dL (ref 5–40)

## 2016-08-30 LAB — VITAMIN D 25 HYDROXY (VIT D DEFICIENCY, FRACTURES): Vit D, 25-Hydroxy: 21.8 ng/mL — ABNORMAL LOW (ref 30.0–100.0)

## 2016-08-30 LAB — HEMOGLOBIN A1C
Est. average glucose Bld gHb Est-mCnc: 151 mg/dL
Hgb A1c MFr Bld: 6.9 % — ABNORMAL HIGH (ref 4.8–5.6)

## 2016-08-31 ENCOUNTER — Other Ambulatory Visit: Payer: Self-pay | Admitting: Adult Health

## 2016-08-31 DIAGNOSIS — I252 Old myocardial infarction: Secondary | ICD-10-CM

## 2016-09-01 NOTE — Telephone Encounter (Signed)
Good Morning Tonya, Refill approved. Can you please call Mr. Lollie SailsHarry and ask if/when he has f/u with cards? I just reviewed previous encounters and I see hosp. Admission for NSTEMI July/Aug 2017. We did not discuss this when he established a new pt recently and I want to ensure that receives appropriate cards f/u. Thanks! Orpha BurKaty

## 2016-09-01 NOTE — Telephone Encounter (Signed)
We have not prescribed these medications for the patient previously.  Please review and refill if appropriate.  T. Nelson, CMA  

## 2016-09-03 NOTE — Telephone Encounter (Signed)
LVM for pt to call to discuss.  T. Cyrene Gharibian, CMA  

## 2016-09-04 NOTE — Addendum Note (Signed)
Addended by: Stan HeadNELSON, Tysen Roesler S on: 09/04/2016 11:37 AM   Modules accepted: Orders

## 2016-09-04 NOTE — Telephone Encounter (Signed)
Pt states that [place] and has not seen a [place] cardiologist since discharge.  Referral placed to cardiology for follow up.  Tiajuana Amass. Lashon Beringer, CMA

## 2016-09-11 ENCOUNTER — Ambulatory Visit: Payer: BC Managed Care – PPO | Admitting: Adult Health

## 2016-09-30 ENCOUNTER — Encounter: Payer: Self-pay | Admitting: Adult Health

## 2016-09-30 ENCOUNTER — Ambulatory Visit (INDEPENDENT_AMBULATORY_CARE_PROVIDER_SITE_OTHER): Payer: BC Managed Care – PPO | Admitting: Adult Health

## 2016-09-30 VITALS — BP 143/82 | HR 60 | Ht 70.0 in | Wt 250.0 lb

## 2016-09-30 DIAGNOSIS — R7309 Other abnormal glucose: Secondary | ICD-10-CM

## 2016-09-30 DIAGNOSIS — E119 Type 2 diabetes mellitus without complications: Secondary | ICD-10-CM | POA: Insufficient documentation

## 2016-09-30 DIAGNOSIS — Z794 Long term (current) use of insulin: Secondary | ICD-10-CM

## 2016-09-30 DIAGNOSIS — I10 Essential (primary) hypertension: Secondary | ICD-10-CM

## 2016-09-30 DIAGNOSIS — Z0000 Encounter for general adult medical examination without abnormal findings: Secondary | ICD-10-CM | POA: Diagnosis not present

## 2016-09-30 DIAGNOSIS — Z8679 Personal history of other diseases of the circulatory system: Secondary | ICD-10-CM | POA: Diagnosis not present

## 2016-09-30 DIAGNOSIS — E118 Type 2 diabetes mellitus with unspecified complications: Secondary | ICD-10-CM

## 2016-09-30 DIAGNOSIS — E7800 Pure hypercholesterolemia, unspecified: Secondary | ICD-10-CM

## 2016-09-30 DIAGNOSIS — E1169 Type 2 diabetes mellitus with other specified complication: Secondary | ICD-10-CM | POA: Insufficient documentation

## 2016-09-30 DIAGNOSIS — E785 Hyperlipidemia, unspecified: Secondary | ICD-10-CM | POA: Insufficient documentation

## 2016-09-30 LAB — POCT UA - MICROALBUMIN
Creatinine, POC: 300 mg/dL
Microalbumin Ur, POC: 150 mg/L

## 2016-09-30 MED ORDER — BLOOD GLUCOSE MONITOR KIT: PACK | 0 refills | Status: DC

## 2016-09-30 MED ORDER — METFORMIN HCL 500 MG PO TABS: 500.0000 mg | ORAL_TABLET | Freq: Every day | ORAL | 0 refills | Status: DC

## 2016-09-30 MED ORDER — RAMIPRIL 5 MG PO CAPS: 5.0000 mg | ORAL_CAPSULE | Freq: Every day | ORAL | 3 refills | Status: DC

## 2016-09-30 MED ORDER — ATORVASTATIN CALCIUM 40 MG PO TABS
40.0000 mg | ORAL_TABLET | Freq: Every day | ORAL | 0 refills | Status: DC
Start: 2016-09-30 — End: 2016-12-04

## 2016-09-30 MED ORDER — AMLODIPINE BESYLATE 5 MG PO TABS: 5.0000 mg | ORAL_TABLET | Freq: Every day | ORAL | 1 refills | Status: DC

## 2016-09-30 NOTE — Assessment & Plan Note (Addendum)
Cards referral placed. EKG completed today, unchanged from August 2017 tracing. Results discussed with pt.

## 2016-09-30 NOTE — Assessment & Plan Note (Addendum)
Continue Amlodipine, however dosage reduced to 5mg  (it taking 10mg  tablet, then please break in half so that you will only ingest 5mg ). Discontinued Labetalol 100mg  for blood pressure. Started on Ramipril 5mg  daily for blood pressure. Discussed Microalbumin results-protein detected. EKG results discussed-cardiology placed. EKG unchanged from August 2017 tracing. Please follow up in 6 weeks for labs and office visit. Please contact clinic with any questions/concerns.

## 2016-09-30 NOTE — Assessment & Plan Note (Addendum)
08/29/16 A1c 6.9 Started on Metformin 500mg with dinner. Monitor kit ordered. Extensive info provided on BS monitoring, diet, exercise, and medication.. Check blood sugars each morning and bring log with you to next appt.  F/u 6 weeks 

## 2016-09-30 NOTE — Assessment & Plan Note (Addendum)
Cholesterol, Total 100 - 199 mg/dL 161217    Triglycerides 0 - 149 mg/dL 096198    HDL >04>39 mg/dL 35    VLDL Cholesterol Cal 5 - 40 mg/dL 40   LDL Calculated 0 - 99 mg/dL 540142    Chol/HDL Ratio 0.0 - 5.0 ratio 6.2    Comment:                 T. Chol/HDL Ratio                        Men Women                 1/2 Avg.Risk 3.4  3.3                   Avg.Risk 5.0  4.4                 2X Avg.Risk 9.6  7.1                 3X Avg.Risk 23.4  11.0   Resulting Agency  [facility]  Narrative   Performed at: [address] - [facility] [address], [place], [place] [id number] Lab Director: Mila HomerWilliam F Hancock MD, Phone: [phone]     Started Atorvastatin 40mg . Instructed to drink at least gallon water a day, to prevent muscle aches and improve kidney fx.

## 2016-09-30 NOTE — Progress Notes (Signed)
Subjective:    Patient ID: Stanley Cook, male    DOB: 10/03/1960, 56 y.o.   MRN: [id number]  HPI:  Stanley Cook is here for f/u regarding abnormal labs and to discuss sig med hx.  He was hospitalized July 2017 with following: Discharge Diagnoses:  1. Acute non-ST elevation MI 2. Hypertensive emergency 3. Deafness 4. Obesity r/o OSA 5. Acute kidney injury likely chronic but there is no recent blood work available 6. Left knee sprain  7. Prediabetes/metabolic syndrome 8. Vitamin D deficiency 9. B12 deficiency 10.Chronic diastolic dysfunction 11.LVH Discussion in hospital notes whether he actually had MI vs hypertensive crisis. He has note been to cards since d/c.  His care has [facility] by [facility], HTN treated with Amlodipine 3m and Labetalol 1070mBID, again he has never seen cards. He established with our practice early May and full lab work up revealed: HL, T2D (A1c 6.9), CKD.  This is our first opportunity to discuss in detail his medical hx and to address these medical disorders. He has been trying to increase exercise, improve diet and "trying really hard to stop drinking soda and start using more water".   Patient Care Team    Relationship Specialty Notifications Start End  DaEsaw GrandchildNP PCP - General Family Medicine  08/19/16     Patient Active Problem List   Diagnosis Date Noted  . H/O hypertensive crisis 09/30/2016  . Elevated cholesterol 09/30/2016  . Type 2 diabetes mellitus without complication, without long-term current use of insulin ([place]06/19/2018  . Health care maintenance 08/19/2016  . Hypertension 08/19/2016  . Obesity (BMI 35.0-39.9 without comorbidity) 08/19/2016     Past Medical History:  Diagnosis Date  . Hyperlipidemia   . Hypertension      No past surgical history on file.   Family History  Problem Relation Age of Onset  . Diabetes Sister      History  Drug Use No     History  Alcohol Use No     History    Smoking Status  . Never Smoker  Smokeless Tobacco  . Never Used     Outpatient Encounter Prescriptions as of 09/30/2016  Medication Sig  . amLODipine (NORVASC) 5 MG tablet Take 1 tablet (5 mg total) by mouth daily.  . Marland Kitchenspirin EC 81 MG tablet Take by mouth.  . Calcium Carb-Cholecalciferol (CALCIUM 600 + D PO) Take by mouth.  . [DISCONTINUED] amLODipine (NORVASC) 10 MG tablet Take 1 tablet (10 mg total) by mouth daily.  . [DISCONTINUED] labetalol (NORMODYNE) 100 MG tablet take 1 tablet by mouth twice a day  . atorvastatin (LIPITOR) 40 MG tablet Take 1 tablet (40 mg total) by mouth at bedtime.  . blood glucose meter kit and supplies KIT Dispense based on patient and insurance preference. Use up to four times daily as directed. (FOR ICD-9 250.00, 250.01).  . metFORMIN (GLUCOPHAGE) 500 MG tablet Take 1 tablet (500 mg total) by mouth daily after supper.  . ramipril (ALTACE) 5 MG capsule Take 1 capsule (5 mg total) by mouth daily.   No facility-administered encounter medications on file as of 09/30/2016.    Allergies: Penicillins  Body mass index is 35.87 kg/m.  Blood pressure (!) 143/82, pulse 60, height 5' 10"  (1.778 m), weight 250 lb (113.4 kg).  Review of Systems  Constitutional: Positive for fatigue. Negative for activity change, appetite change, chills, diaphoresis, fever and unexpected weight change.  Respiratory: Negative for cough, chest tightness, shortness  of breath, wheezing and stridor.   Cardiovascular: Negative for chest pain, palpitations and leg swelling.  Gastrointestinal: Negative for abdominal distention, abdominal pain, anal bleeding, constipation, diarrhea, nausea and vomiting.  Endocrine: Negative for cold intolerance, heat intolerance, polydipsia, polyphagia and polyuria.  Genitourinary: Negative for difficulty urinating, flank pain and hematuria.  Musculoskeletal: Negative for arthralgias, back pain, gait problem, joint swelling, myalgias, neck pain and neck  stiffness.  Skin: Negative for color change, pallor, rash and wound.  Allergic/Immunologic: Negative for immunocompromised state.  Neurological: Negative for dizziness and headaches.  Hematological: Does not bruise/bleed easily.  Psychiatric/Behavioral: Negative for dysphoric mood and sleep disturbance. The patient is not nervous/anxious.        Objective:   Physical Exam  Constitutional: He is oriented to person, place, and time. He appears well-developed and well-nourished. No distress.  HENT:  Head: Normocephalic and atraumatic.  Eyes: Conjunctivae are normal. Pupils are equal, round, and reactive to light.  Neck: Normal range of motion. Neck supple.  Cardiovascular: Normal rate, regular rhythm, normal heart sounds and intact distal pulses.   No murmur heard. Pulmonary/Chest: Effort normal and breath sounds normal. No respiratory distress. He has no wheezes. He has no rales. He exhibits no tenderness.  Abdominal: Soft. Bowel sounds are normal. He exhibits no distension and no mass. There is no tenderness. There is no rebound and no guarding.  Musculoskeletal: Normal range of motion.  Lymphadenopathy:    He has no cervical adenopathy.  Neurological: He is alert and oriented to person, place, and time.  Skin: Skin is warm and dry. No rash noted. He is not diaphoretic. No erythema. No pallor.  Psychiatric: He has a normal mood and affect. His behavior is normal. Judgment and thought content normal.  Nursing note and vitals reviewed.         Assessment & Plan:   1. Elevated glucose   2. Health care maintenance   3. Essential hypertension   4. H/O hypertensive crisis   5. Elevated cholesterol   6. Type 2 diabetes mellitus without complication, without long-term current use of insulin [facility])     Health care maintenance It is imperative that you increase water intake to at least >100 ounces daily. You need to reduce saturated fat/sodium/sugar/carbohydrate intake-please see  eating guides above. Increase regular movement to at least 6mns/5times a week, I.e walking, stationary bike, swimming, light weights. EKG results discussed-cardiology placed. Please follow up in 6 weeks for labs and office visit. Please contact clinic with any questions/concerns. Spent > 45 minutes face/face with pt-coordinating/directing care.  Hypertension Continue Amlodipine, however dosage reduced to 543m(it taking 1022mablet, then please break in half so that you will only ingest 5mg33mDiscontinued Labetalol 100mg58m blood pressure. Started on Ramipril 5mg d35my for blood pressure. Discussed Microalbumin results-protein detected. EKG results discussed-cardiology placed. EKG unchanged from August 2017 tracing. Please follow up in 6 weeks for labs and office visit. Please contact clinic with any questions/concerns.  H/O hypertensive crisis Cards referral placed. EKG completed today, unchanged from August 2017 tracing. Results discussed with pt.  Elevated cholesterol Cholesterol, Total 100 - 199 mg/dL 217    Triglycerides 0 - 149 mg/dL 198    HDL >39 mg/dL 35    VLDL Cholesterol Cal 5 - 40 mg/dL 40   LDL Calculated 0 - 99 mg/dL 142    Chol/HDL Ratio 0.0 - 5.0 ratio 6.2    Comment:  T. Chol/HDL Ratio                        Men Women                 1/2 Avg.Risk 3.4  3.3                   Avg.Risk 5.0  4.4                 2X Avg.Risk 9.6  7.1                 3X Avg.Risk 23.4  11.0   Resulting Agency  [facility]  Narrative   Performed at: [facility] [address], [place], [place] [id number] Lab Director: Lindon Romp MD, Phone: [phone]     Started Atorvastatin 42m. Instructed to drink at least gallon water a day, to prevent muscle aches and improve kidney fx.  Type 2 diabetes mellitus without complication, without  long-term current use of insulin ([place] 08/29/16 A1c 6.9 Started on Metformin 502mwith dinner. Monitor kit ordered. Extensive info provided on BS monitoring, diet, exercise, and medication.. Check blood sugars each morning and bring log with you to next appt.  F/u 6 weeks    FOLLOW-UP:  Return in about 6 weeks (around 11/11/2016) for Regular Follow Up, HTN, Diabetes.

## 2016-09-30 NOTE — Assessment & Plan Note (Addendum)
It is imperative that you increase water intake to at least >100 ounces daily. You need to reduce saturated fat/sodium/sugar/carbohydrate intake-please see eating guides above. Increase regular movement to at least 2730mins/5times a week, I.e walking, stationary bike, swimming, light weights. EKG results discussed-cardiology placed. Please follow up in 6 weeks for labs and office visit. Please contact clinic with any questions/concerns. Spent > 45 minutes face/face with pt-coordinating/directing care.

## 2016-09-30 NOTE — Patient Instructions (Addendum)
Blood Glucose Monitoring, Adult Monitoring your blood sugar (glucose) helps you manage your diabetes. It also helps you and your health care provider determine how well your diabetes management plan is working. Blood glucose monitoring involves checking your blood glucose as often as directed, and keeping a record (log) of your results over time. Why should I monitor my blood glucose? Checking your blood glucose regularly can:  Help you understand how food, exercise, illnesses, and medicines affect your blood glucose.  Let you know what your blood glucose is at any time. You can quickly tell if you are having low blood glucose (hypoglycemia) or high blood glucose (hyperglycemia).  Help you and your health care provider adjust your medicines as needed.  When should I check my blood glucose? Follow instructions from your health care provider about how often to check your blood glucose. This may depend on:  The type of diabetes you have.  How well-controlled your diabetes is.  Medicines you are taking.  If you have type 1 diabetes:  Check your blood glucose at least 2 times a day.  Also check your blood glucose: ? Before every insulin injection. ? Before and after exercise. ? Between meals. ? 2 hours after a meal. ? Occasionally between 2:00 a.m. and 3:00 a.m., as directed. ? Before potentially dangerous tasks, like driving or using heavy machinery. ? At bedtime.  You may need to check your blood glucose more often, up to 6-10 times a day: ? If you use an insulin pump. ? If you need multiple daily injections (MDI). ? If your diabetes is not well-controlled. ? If you are ill. ? If you have a history of severe hypoglycemia. ? If you have a history of not knowing when your blood glucose is getting low (hypoglycemia unawareness). If you have type 2 diabetes:  If you take insulin or other diabetes medicines, check your blood glucose at least 2 times a day.  If you are on intensive  insulin therapy, check your blood glucose at least 4 times a day. Occasionally, you may also need to check between 2:00 a.m. and 3:00 a.m., as directed.  Also check your blood glucose: ? Before and after exercise. ? Before potentially dangerous tasks, like driving or using heavy machinery.  You may need to check your blood glucose more often if: ? Your medicine is being adjusted. ? Your diabetes is not well-controlled. ? You are ill. What is a blood glucose log?  A blood glucose log is a record of your blood glucose readings. It helps you and your health care provider: ? Look for patterns in your blood glucose over time. ? Adjust your diabetes management plan as needed.  Every time you check your blood glucose, write down your result and notes about things that may be affecting your blood glucose, such as your diet and exercise for the day.  Most glucose meters store a record of glucose readings in the meter. Some meters allow you to download your records to a computer. How do I check my blood glucose? Follow these steps to get accurate readings of your blood glucose: Supplies needed   Blood glucose meter.  Test strips for your meter. Each meter has its own strips. You must use the strips that come with your meter.  A needle to prick your finger (lancet). Do not use lancets more than once.  A device that holds the lancet (lancing device).  A journal or log book to write down your results. Procedure  Wash your hands with soap and water.  Prick the side of your finger (not the tip) with the lancet. Use a different finger each time.  Gently rub the finger until a small drop of blood appears.  Follow instructions that come with your meter for inserting the test strip, applying blood to the strip, and using your blood glucose meter.  Write down your result and any notes. Alternative testing sites  Some meters allow you to use areas of your body other than your finger  (alternative sites) to test your blood.  If you think you may have hypoglycemia, or if you have hypoglycemia unawareness, do not use alternative sites. Use your finger instead.  Alternative sites may not be as accurate as the fingers, because blood flow is slower in these areas. This means that the result you get may be delayed, and it may be different from the result that you would get from your finger.  The most common alternative sites are: ? Forearm. ? Thigh. ? Palm of the hand. Additional tips  Always keep your supplies with you.  If you have questions or need help, all blood glucose meters have a 24-hour "hotline" number that you can call. You may also contact your health care provider.  After you use a few boxes of test strips, adjust (calibrate) your blood glucose meter by following instructions that came with your meter. This information is not intended to replace advice given to you by your health care provider. Make sure you discuss any questions you have with your health care provider. Document Released: 04/03/2003 Document Revised: 10/19/2015 Document Reviewed: 09/10/2015 Elsevier Interactive Patient Education  2017 [employer] for Diabetes Mellitus, Adult Carbohydrate counting is a method for keeping track of how many carbohydrates you eat. Eating carbohydrates naturally increases the amount of sugar (glucose) in the blood. Counting how many carbohydrates you eat helps keep your blood glucose within normal limits, which helps you manage your diabetes (diabetes mellitus). It is important to know how many carbohydrates you can safely have in each meal. This is different for every person. A diet and nutrition specialist (registered dietitian) can help you make a meal plan and calculate how many carbohydrates you should have at each meal and snack. Carbohydrates are found in the following foods:  Grains, such as breads and cereals.  Dried beans and  soy products.  Starchy vegetables, such as potatoes, peas, and corn.  Fruit and fruit juices.  Milk and yogurt.  Sweets and snack foods, such as cake, cookies, candy, chips, and soft drinks.  How do I count carbohydrates? There are two ways to count carbohydrates in food. You can use either of the methods or a combination of both. Reading "Nutrition Facts" on packaged food The "Nutrition Facts" list is included on the labels of almost all packaged foods and beverages in the [place] It includes:  The serving size.  Information about nutrients in each serving, including the grams (g) of carbohydrate per serving.  To use the "Nutrition Facts":  Decide how many servings you will have.  Multiply the number of servings by the number of carbohydrates per serving.  The resulting number is the total amount of carbohydrates that you will be having.  Learning standard serving sizes of other foods When you eat foods containing carbohydrates that are not packaged or do not include "Nutrition Facts" on the label, you need to measure the servings in order to count the amount of carbohydrates:  Measure the foods that you will eat with a food scale or measuring cup, if needed.  Decide how many standard-size servings you will eat.  Multiply the number of servings by 15. Most carbohydrate-rich foods have about 15 g of carbohydrates per serving. ? For example, if you eat 8 oz (170 g) of strawberries, you will have eaten 2 servings and 30 g of carbohydrates (2 servings x 15 g = 30 g).  For foods that have more than one food mixed, such as soups and casseroles, you must count the carbohydrates in each food that is included.  The following list contains standard serving sizes of common carbohydrate-rich foods. Each of these servings has about 15 g of carbohydrates:   hamburger bun or  English muffin.   oz (15 mL) syrup.   oz (14 g) jelly.  1 slice of bread.  1 six-inch tortilla.  3 oz  (85 g) cooked rice or pasta.  4 oz (113 g) cooked dried beans.  4 oz (113 g) starchy vegetable, such as peas, corn, or potatoes.  4 oz (113 g) hot cereal.  4 oz (113 g) mashed potatoes or  of a large baked potato.  4 oz (113 g) canned or frozen fruit.  4 oz (120 mL) fruit juice.  4-6 crackers.  6 chicken nuggets.  6 oz (170 g) unsweetened dry cereal.  6 oz (170 g) plain fat-free yogurt or yogurt sweetened with artificial sweeteners.  8 oz (240 mL) milk.  8 oz (170 g) fresh fruit or one small piece of fruit.  24 oz (680 g) popped popcorn.  Example of carbohydrate counting Sample meal  3 oz (85 g) chicken breast.  6 oz (170 g) brown rice.  4 oz (113 g) corn.  8 oz (240 mL) milk.  8 oz (170 g) strawberries with sugar-free whipped topping. Carbohydrate calculation 1. Identify the foods that contain carbohydrates: ? Rice. ? Corn. ? Milk. ? Strawberries. 2. Calculate how many servings you have of each food: ? 2 servings rice. ? 1 serving corn. ? 1 serving milk. ? 1 serving strawberries. 3. Multiply each number of servings by 15 g: ? 2 servings rice x 15 g = 30 g. ? 1 serving corn x 15 g = 15 g. ? 1 serving milk x 15 g = 15 g. ? 1 serving strawberries x 15 g = 15 g. 4. Add together all of the amounts to find the total grams of carbohydrates eaten: ? 30 g + 15 g + 15 g + 15 g = 75 g of carbohydrates total. This information is not intended to replace advice given to you by your health care provider. Make sure you discuss any questions you have with your health care provider. Document Released: 03/31/2005 Document Revised: 10/19/2015 Document Reviewed: 09/12/2015 Elsevier Interactive Patient Education  2018 [employer].  Metformin tablets What is this medicine? METFORMIN (met FOR min) is used to treat type 2 diabetes. It helps to control blood sugar. Treatment is combined with diet and exercise. This medicine can be used alone or with other medicines for  diabetes. This medicine may be used for other purposes; ask your health care provider or pharmacist if you have questions. COMMON BRAND NAME(S): Glucophage What should I tell my health care provider before I take this medicine? They need to know if you have any of these conditions: -anemia -dehydration -heart disease -frequently drink alcohol-containing beverages -kidney disease -liver disease -polycystic ovary syndrome -serious infection or injury -vomiting -  an unusual or allergic reaction to metformin, other medicines, foods, dyes, or preservatives -pregnant or trying to get pregnant -breast-feeding How should I use this medicine? Take this medicine by mouth with a glass of water. Follow the directions on the prescription label. Take this medicine with food. Take your medicine at regular intervals. Do not take your medicine more often than directed. Do not stop taking except on your doctor's advice. Talk to your pediatrician regarding the use of this medicine in children. While this drug may be prescribed for children as young as 39 years of age for selected conditions, precautions do apply. Overdosage: If you think you have taken too much of this medicine contact a poison control center or emergency room at once. NOTE: This medicine is only for you. Do not share this medicine with others. What if I miss a dose? If you miss a dose, take it as soon as you can. If it is almost time for your next dose, take only that dose. Do not take double or extra doses. What may interact with this medicine? Do not take this medicine with any of the following medications: -dofetilide -certain contrast medicines given before X-rays, CT scans, MRI, or other procedures This medicine may also interact with the following medications: -acetazolamide -certain antiviral medicines for HIV or AIDS or for hepatitis, like adefovir, dolutegravir, emtricitabine, entecavir, lamivudine, paritaprevir, or  tenofovir -cimetidine -cobicistat -crizotinib -dichlorphenamide -digoxin -diuretics -male hormones, like estrogens or progestins and birth control pills -glycopyrrolate -isoniazid -lamotrigine -medicines for blood pressure, heart disease, irregular heart beat -memantine -midodrine -methazolamide -morphine -niacin -phenothiazines like chlorpromazine, mesoridazine, prochlorperazine, thioridazine -phenytoin -procainamide -propantheline -quinidine -quinine -ranitidine -ranolazine -steroid medicines like prednisone or cortisone -stimulant medicines for attention disorders, weight loss, or to stay awake -thyroid medicines -topiramate -trimethoprim -trospium -vancomycin -vandetanib -zonisamide This list may not describe all possible interactions. Give your health care provider a list of all the medicines, herbs, non-prescription drugs, or dietary supplements you use. Also tell them if you smoke, drink alcohol, or use illegal drugs. Some items may interact with your medicine. What should I watch for while using this medicine? Visit your doctor or health care professional for regular checks on your progress. A test called the HbA1C (A1C) will be monitored. This is a simple blood test. It measures your blood sugar control over the last 2 to 3 months. You will receive this test every 3 to 6 months. Learn how to check your blood sugar. Learn the symptoms of low and high blood sugar and how to manage them. Always carry a quick-source of sugar with you in case you have symptoms of low blood sugar. Examples include hard sugar candy or glucose tablets. Make sure others know that you can choke if you eat or drink when you develop serious symptoms of low blood sugar, such as seizures or unconsciousness. They must get medical help at once. Tell your doctor or health care professional if you have high blood sugar. You might need to change the dose of your medicine. If you are sick or exercising  more than usual, you might need to change the dose of your medicine. Do not skip meals. Ask your doctor or health care professional if you should avoid alcohol. Many nonprescription cough and cold products contain sugar or alcohol. These can affect blood sugar. This medicine may cause ovulation in premenopausal women who do not have regular monthly periods. This may increase your chances of becoming pregnant. You should not take this medicine if  you become pregnant or think you may be pregnant. Talk with your doctor or health care professional about your birth control options while taking this medicine. Contact your doctor or health care professional right away if think you are pregnant. If you are going to need surgery, a MRI, CT scan, or other procedure, tell your doctor that you are taking this medicine. You may need to stop taking this medicine before the procedure. Wear a medical ID bracelet or chain, and carry a card that describes your disease and details of your medicine and dosage times. What side effects may I notice from receiving this medicine? Side effects that you should report to your doctor or health care professional as soon as possible: -allergic reactions like skin rash, itching or hives, swelling of the face, lips, or tongue -breathing problems -feeling faint or lightheaded, falls -muscle aches or pains -signs and symptoms of low blood sugar such as feeling anxious, confusion, dizziness, increased hunger, unusually weak or tired, sweating, shakiness, cold, irritable, headache, blurred vision, fast heartbeat, loss of consciousness -slow or irregular heartbeat -unusual stomach pain or discomfort -unusually tired or weak Side effects that usually do not require medical attention (report to your doctor or health care professional if they continue or are bothersome): -diarrhea -headache -heartburn -metallic taste in mouth -nausea -stomach gas, upset This list may not describe all  possible side effects. Call your doctor for medical advice about side effects. You may report side effects to FDA at 1-800-FDA-1088. Where should I keep my medicine? Keep out of the reach of children. Store at room temperature between 15 and 30 degrees C (59 and 86 degrees F). Protect from moisture and light. Throw away any unused medicine after the expiration date. NOTE: This sheet is a summary. It may not cover all possible information. If you have questions about this medicine, talk to your doctor, pharmacist, or health care provider.  2018 Elsevier/Gold Standard (2015-10-10 15:34:19)  Atorvastatin tablets What is this medicine? ATORVASTATIN (a TORE va sta tin) is known as a HMG-CoA reductase inhibitor or 'statin'. It lowers the level of cholesterol and triglycerides in the blood. This drug may also reduce the risk of heart attack, stroke, or other health problems in patients with risk factors for heart disease. Diet and lifestyle changes are often used with this drug. This medicine may be used for other purposes; ask your health care provider or pharmacist if you have questions. COMMON BRAND NAME(S): Lipitor What should I tell my health care provider before I take this medicine? They need to know if you have any of these conditions: -frequently drink alcoholic beverages -history of stroke, TIA -kidney disease -liver disease -muscle aches or weakness -other medical condition -an unusual or allergic reaction to atorvastatin, other medicines, foods, dyes, or preservatives -pregnant or trying to get pregnant -breast-feeding How should I use this medicine? Take this medicine by mouth with a glass of water. Follow the directions on the prescription label. You can take this medicine with or without food. Take your doses at regular intervals. Do not take your medicine more often than directed. Talk to your pediatrician regarding the use of this medicine in children. While this drug may be  prescribed for children as young as 40 years old for selected conditions, precautions do apply. Overdosage: If you think you have taken too much of this medicine contact a poison control center or emergency room at once. NOTE: This medicine is only for you. Do not share this medicine with  others. What if I miss a dose? If you miss a dose, take it as soon as you can. If it is almost time for your next dose, take only that dose. Do not take double or extra doses. What may interact with this medicine? Do not take this medicine with any of the following medications: -red yeast rice -telaprevir -telithromycin -voriconazole This medicine may also interact with the following medications: -alcohol -antiviral medicines for HIV or AIDS -boceprevir -certain antibiotics like clarithromycin, erythromycin, troleandomycin -certain medicines for cholesterol like fenofibrate or gemfibrozil -cimetidine -clarithromycin -colchicine -cyclosporine -digoxin -male hormones, like estrogens or progestins and birth control pills -grapefruit juice -medicines for fungal infections like fluconazole, itraconazole, ketoconazole -niacin -rifampin -spironolactone This list may not describe all possible interactions. Give your health care provider a list of all the medicines, herbs, non-prescription drugs, or dietary supplements you use. Also tell them if you smoke, drink alcohol, or use illegal drugs. Some items may interact with your medicine. What should I watch for while using this medicine? Visit your doctor or health care professional for regular check-ups. You may need regular tests to make sure your liver is working properly. Tell your doctor or health care professional right away if you get any unexplained muscle pain, tenderness, or weakness, especially if you also have a fever and tiredness. Your doctor or health care professional may tell you to stop taking this medicine if you develop muscle problems. If  your muscle problems do not go away after stopping this medicine, contact your health care professional. This drug is only part of a total heart-health program. Your doctor or a dietician can suggest a low-cholesterol and low-fat diet to help. Avoid alcohol and smoking, and keep a proper exercise schedule. Do not use this drug if you are pregnant or breast-feeding. Serious side effects to an unborn child or to an infant are possible. Talk to your doctor or pharmacist for more information. This medicine may affect blood sugar levels. If you have diabetes, check with your doctor or health care professional before you change your diet or the dose of your diabetic medicine. If you are going to have surgery tell your health care professional that you are taking this drug. What side effects may I notice from receiving this medicine? Side effects that you should report to your doctor or health care professional as soon as possible: -allergic reactions like skin rash, itching or hives, swelling of the face, lips, or tongue -dark urine -fever -joint pain -muscle cramps, pain -redness, blistering, peeling or loosening of the skin, including inside the mouth -trouble passing urine or change in the amount of urine -unusually weak or tired -yellowing of eyes or skin Side effects that usually do not require medical attention (report to your doctor or health care professional if they continue or are bothersome): -constipation -heartburn -stomach gas, pain, upset This list may not describe all possible side effects. Call your doctor for medical advice about side effects. You may report side effects to FDA at 1-800-FDA-1088. Where should I keep my medicine? Keep out of the reach of children. Store at room temperature between 20 to 25 degrees C (68 to 77 degrees F). Throw away any unused medicine after the expiration date. NOTE: This sheet is a summary. It may not cover all possible information. If you have  questions about this medicine, talk to your doctor, pharmacist, or health care provider.  2018 Elsevier/Gold Standard (2011-02-18 09:18:24)  Ramipril capsules or tablets What is this medicine? RAMIPRIL (  ra MI pril) is an ACE inhibitor. This medicine is used to prevent a heart attack, stroke, or other vascular event in people who are at high risk. It is also used to treat high blood pressure and heart failure after a heart attack. This medicine may be used for other purposes; ask your health care provider or pharmacist if you have questions. COMMON BRAND NAME(S): Altace What should I tell my health care provider before I take this medicine? They need to know if you have any of these conditions: -diabetes -heart or blood vessel disease -immune system disease like lupus or scleroderma -kidney disease -liver disease -low blood pressure -previous swelling of the tongue, face, or lips with difficulty breathing, difficulty swallowing, hoarseness, or tightening of the throat -an unusual or allergic reaction to lisinopril, other ACE inhibitors, insect venom, foods, dyes, or preservatives -pregnant or trying to get pregnant -breast-feeding How should I use this medicine? Take this medicine by mouth with a drink of water. Follow the directions on your prescription label. You may take this medicine with or without food. Take your medicine at regular intervals. Do not take more medicine than directed. Do not stop taking your medicine unless your doctor tells you to. Talk to your pediatrician regarding the use of this medicine in children. Special care may be needed. Overdosage: If you think you have taken too much of this medicine contact a poison control center or emergency room at once. NOTE: This medicine is only for you. Do not share this medicine with others. What if I miss a dose? If you miss a dose, take it as soon as you can. If it is almost time for your next dose, take only that dose. Do not  take double or extra doses. What may interact with this medicine? Do not take this medication with any of the following medications: -sacubitril; valsartan This medicine may also interact with the following: -diuretics -everolimus -lithium -NSAIDs, medicines for pain and inflammation, like ibuprofen or naproxen -over-the-counter herbal supplements like hawthorn -potassium supplements -salt substitutes -sirolimus -temsirolimus This list may not describe all possible interactions. Give your health care provider a list of all the medicines, herbs, non-prescription drugs, or dietary supplements you use. Also tell them if you smoke, drink alcohol, or use illegal drugs. Some items may interact with your medicine. What should I watch for while using this medicine? Visit your doctor or health care professional for regular check ups. Check your blood pressure as directed. Ask your doctor what your blood pressure should be, and when you should contact him or her. If you are diabetic, check your blood sugar as directed. Women should inform their doctor if they wish to become pregnant or think they might be pregnant. There is a potential for serious side effects to an unborn child. Talk to your health care professional or pharmacist for more information. Check with your doctor if you have severe diarrhea or vomiting, or if you sweat a lot or become dehydrated. The loss of body fluids can be dangerous while taking this medicine. A few patients have had strong allergic reactions during desensitization treatment with hymenoptera venom and during some kinds of dialysis. Talk to your doctor if you are going to have either of these procedures. If you are going to have surgery, tell your doctor or health care professional that you are taking this medicine. What side effects may I notice from receiving this medicine? Side effects that you should report to your doctor or health care  professional as soon as  possible: -abdominal pain with or without nausea or vomiting -allergic reactions like skin rash or hives, swelling of the hands, feet, face, lips, throat, or tongue -dark urine -difficulty breathing -dizzy, lightheaded or fainting spell -fever or sore throat -irregular heart beat, chest pain -numbness or tingling in fingers or toes -pain or difficulty passing urine -unusually weak -yellowing of the eyes or skin Side effects that usually do not require medical attention (report to your doctor or health care professional if they continue or are bothersome): -change in sex drive or performance -change in taste -cough -headache -tired This list may not describe all possible side effects. Call your doctor for medical advice about side effects. You may report side effects to FDA at 1-800-FDA-1088. Where should I keep my medicine? Keep out of the reach of children. Store at room temperature between 15 and 30 degrees C (59 and 86 degrees F). Protect from moisture. Keep container tightly closed. Throw away any unused medicine after the expiration date. NOTE: This sheet is a summary. It may not cover all possible information. If you have questions about this medicine, talk to your doctor, pharmacist, or health care provider.  2018 Elsevier/Gold Standard (2015-05-25 12:06:29)   Heart-Healthy Eating Plan Many factors influence your heart health, including eating and exercise habits. Heart (coronary) risk increases with abnormal blood fat (lipid) levels. Heart-healthy meal planning includes limiting unhealthy fats, increasing healthy fats, and making other small dietary changes. This includes maintaining a healthy body weight to help keep lipid levels within a normal range. What is my plan? Your health care provider recommends that you:  Get no more than _________% of the total calories in your daily diet from fat.  Limit your intake of saturated fat to less than _________% of your total  calories each day.  Limit the amount of cholesterol in your diet to less than _________ mg per day.  What types of fat should I choose?  Choose healthy fats more often. Choose monounsaturated and polyunsaturated fats, such as olive oil and canola oil, flaxseeds, walnuts, almonds, and seeds.  Eat more omega-3 fats. Good choices include salmon, mackerel, sardines, tuna, flaxseed oil, and ground flaxseeds. Aim to eat fish at least two times each week.  Limit saturated fats. Saturated fats are primarily found in animal products, such as meats, butter, and cream. Plant sources of saturated fats include palm oil, palm kernel oil, and coconut oil.  Avoid foods with partially hydrogenated oils in them. These contain trans fats. Examples of foods that contain trans fats are stick margarine, some tub margarines, cookies, crackers, and other baked goods. What general guidelines do I need to follow?  Check food labels carefully to identify foods with trans fats or high amounts of saturated fat.  Fill one half of your plate with vegetables and green salads. Eat 4-5 servings of vegetables per day. A serving of vegetables equals 1 cup of raw leafy vegetables,  cup of raw or cooked cut-up vegetables, or  cup of vegetable juice.  Fill one fourth of your plate with whole grains. Look for the word "whole" as the first word in the ingredient list.  Fill one fourth of your plate with lean protein foods.  Eat 4-5 servings of fruit per day. A serving of fruit equals one medium whole fruit,  cup of dried fruit,  cup of fresh, frozen, or canned fruit, or  cup of 100% fruit juice.  Eat more foods that contain soluble fiber. Examples  of foods that contain this type of fiber are apples, broccoli, carrots, beans, peas, and barley. Aim to get 20-30 g of fiber per day.  Eat more home-cooked food and less restaurant, buffet, and fast food.  Limit or avoid alcohol.  Limit foods that are high in starch and  sugar.  Avoid fried foods.  Cook foods by using methods other than frying. Baking, boiling, grilling, and broiling are all great options. Other fat-reducing suggestions include: ? Removing the skin from poultry. ? Removing all visible fats from meats. ? Skimming the fat off of stews, soups, and gravies before serving them. ? Steaming vegetables in water or broth.  Lose weight if you are overweight. Losing just 5-10% of your initial body weight can help your overall health and prevent diseases such as diabetes and heart disease.  Increase your consumption of nuts, legumes, and seeds to 4-5 servings per week. One serving of dried beans or legumes equals  cup after being cooked, one serving of nuts equals 1 ounces, and one serving of seeds equals  ounce or 1 tablespoon.  You may need to monitor your salt (sodium) intake, especially if you have high blood pressure. Talk with your health care provider or dietitian to get more information about reducing sodium. What foods can I eat? Grains  Breads, including [place], white, pita, wheat, raisin, rye, oatmeal, and [place]. Tortillas that are neither fried nor made with lard or trans fat. Low-fat rolls, including hotdog and hamburger buns and English muffins. Biscuits. Muffins. Waffles. Pancakes. Light popcorn. Whole-grain cereals. Flatbread. Melba toast. Pretzels. Breadsticks. Rusks. Low-fat snacks and crackers, including oyster, saltine, matzo, graham, animal, and rye. Rice and pasta, including brown rice and those that are made with whole wheat. Vegetables All vegetables. Fruits All fruits, but limit coconut. Meats and Other Protein Sources Lean, well-trimmed beef, veal, pork, and lamb. Chicken and [place] without skin. All fish and shellfish. Wild duck, rabbit, pheasant, and venison. Egg whites or low-cholesterol egg substitutes. Dried beans, peas, lentils, and tofu.Seeds and most nuts. Dairy Low-fat or nonfat cheeses, including ricotta,  string, and mozzarella. Skim or 1% milk that is liquid, powdered, or evaporated. Buttermilk that is made with low-fat milk. Nonfat or low-fat yogurt. Beverages Mineral water. Diet carbonated beverages. Sweets and Desserts Sherbets and fruit ices. Honey, jam, marmalade, jelly, and syrups. Meringues and gelatins. Pure sugar candy, such as hard candy, jelly beans, gumdrops, mints, marshmallows, and small amounts of dark chocolate. [employer] Eat all sweets and desserts in moderation. Fats and Oils Nonhydrogenated (trans-free) margarines. Vegetable oils, including soybean, sesame, sunflower, olive, peanut, safflower, corn, canola, and cottonseed. Salad dressings or mayonnaise that are made with a vegetable oil. Limit added fats and oils that you use for cooking, baking, salads, and as spreads. Other Cocoa powder. Coffee and tea. All seasonings and condiments. The items listed above may not be a complete list of recommended foods or beverages. Contact your dietitian for more options. What foods are not recommended? Grains Breads that are made with saturated or trans fats, oils, or whole milk. Croissants. Butter rolls. Cheese breads. Sweet rolls. Donuts. Buttered popcorn. Chow mein noodles. High-fat crackers, such as cheese or butter crackers. Meats and Other Protein Sources Fatty meats, such as hotdogs, short ribs, sausage, spareribs, bacon, ribeye roast or steak, and mutton. High-fat deli meats, such as salami and bologna. Caviar. Domestic duck and goose. Organ meats, such as kidney, liver, sweetbreads, brains, gizzard, chitterlings, and heart. Dairy Cream, sour cream, cream cheese, and  creamed cottage cheese. Whole milk cheeses, including blue (bleu), [place], [place], [place], [place], [place], [place], [place], [place], and [place]. Whole or 2% milk that is liquid, evaporated, or condensed. Whole buttermilk. Cream sauce or high-fat cheese sauce. Yogurt that is made from whole  milk. Beverages Regular sodas and drinks with added sugar. Sweets and Desserts Frosting. Pudding. Cookies. Cakes other than angel food cake. Candy that has milk chocolate or white chocolate, hydrogenated fat, butter, coconut, or unknown ingredients. Buttered syrups. Full-fat ice cream or ice cream drinks. Fats and Oils Gravy that has suet, meat fat, or shortening. Cocoa butter, hydrogenated oils, palm oil, coconut oil, palm kernel oil. These can often be found in baked products, candy, fried foods, nondairy creamers, and whipped toppings. Solid fats and shortenings, including bacon fat, salt pork, lard, and butter. Nondairy cream substitutes, such as coffee creamers and sour cream substitutes. Salad dressings that are made of unknown oils, cheese, or sour cream. The items listed above may not be a complete list of foods and beverages to avoid. Contact your dietitian for more information. This information is not intended to replace advice given to you by your health care provider. Make sure you discuss any questions you have with your health care provider. Document Released: 01/08/2008 Document Revised: 10/19/2015 Document Reviewed: 09/22/2013 Elsevier Interactive Patient Education  2017 [place] Amlodipine, however dosage reduced to 69m (it taking 1478mtablet, then please break in half so that you will only ingest 78m74m Discontinued Labetalol 100m20mr blood pressure. Started on Ramipril 78mg 94mly for blood pressure. Started on Atorvastatin 40mg 72my at bedtime for elevated cholesterol. Started on Metformin 500mg d13m with dinner for diabetes. Check blood sugars each morning and bring log with you to next appt. Discussed Microalbumin results-protein detected. It is imperative that you increase water intake to at least >100 ounces daily. You need to reduce saturated fat/sodium/sugar/carbohydrate intake-please see eating guides above. Increase regular movement to at least  30mins/79mes a week, I.e walking, stationary bike, swimming, light weights. EKG results discussed-cardiology placed. Please follow up in 6 weeks for labs and office visit. Please contact clinic with any questions/concerns.

## 2016-09-30 NOTE — Assessment & Plan Note (Signed)
>>  ASSESSMENT AND PLAN FOR DYSLIPIDEMIA WRITTEN ON 09/30/2016  1:13 PM BY DANFORD, KATY D, NP  Cholesterol, Total 100 - 199 mg/dL 217    Triglycerides 0 - 149 mg/dL 198    HDL >39 mg/dL 35    VLDL Cholesterol Cal 5 - 40 mg/dL 40   LDL Calculated 0 - 99 mg/dL 142    Chol/HDL Ratio 0.0 - 5.0 ratio 6.2    Comment:                 T. Chol/HDL Ratio                        Men Women                 1/2 Avg.Risk 3.4  3.3                   Avg.Risk 5.0  4.4                 2X Avg.Risk 9.6  7.1                 3X Avg.Risk 23.4  11.0   Resulting Agency  [facility]  Narrative   Performed at: [facility] [address] [place], [place] [id number] Lab Director: Lindon Romp MD, Phone: [phone]     Started Atorvastatin 40mg . Instructed to drink at least gallon water a day, to prevent muscle aches and improve kidney fx.

## 2016-09-30 NOTE — Assessment & Plan Note (Signed)
>>  ASSESSMENT AND PLAN FOR DIABETES MELLITUS WRITTEN ON 09/30/2016  1:16 PM BY DANFORD, KATY D, NP  08/29/16 A1c 6.9 Started on Metformin 500mg  with dinner. Monitor kit ordered. Extensive info provided on BS monitoring, diet, exercise, and medication.. Check blood sugars each morning and bring log with you to next appt.  F/u 6 weeks

## 2016-10-17 ENCOUNTER — Encounter: Payer: Self-pay | Admitting: Physician Assistant

## 2016-10-17 ENCOUNTER — Ambulatory Visit (INDEPENDENT_AMBULATORY_CARE_PROVIDER_SITE_OTHER): Payer: BC Managed Care – PPO | Admitting: Physician Assistant

## 2016-10-17 VITALS — BP 166/82 | HR 73 | Ht 69.5 in | Wt 250.2 lb

## 2016-10-17 DIAGNOSIS — Z8679 Personal history of other diseases of the circulatory system: Secondary | ICD-10-CM | POA: Diagnosis not present

## 2016-10-17 DIAGNOSIS — I2510 Atherosclerotic heart disease of native coronary artery without angina pectoris: Secondary | ICD-10-CM

## 2016-10-17 DIAGNOSIS — I10 Essential (primary) hypertension: Secondary | ICD-10-CM | POA: Diagnosis not present

## 2016-10-17 LAB — BASIC METABOLIC PANEL
BUN / CREAT RATIO: 14 (ref 9–20)
BUN: 23 mg/dL (ref 6–24)
CHLORIDE: 102 mmol/L (ref 96–106)
CO2: 22 mmol/L (ref 20–29)
Calcium: 9.4 mg/dL (ref 8.7–10.2)
Creatinine, Ser: 1.63 mg/dL — ABNORMAL HIGH (ref 0.76–1.27)
GFR calc non Af Amer: 47 mL/min/{1.73_m2} — ABNORMAL LOW (ref >59)
GFR, EST AFRICAN AMERICAN: 55 mL/min/{1.73_m2} — AB (ref >59)
Glucose: 91 mg/dL (ref 65–99)
Potassium: 4.2 mmol/L (ref 3.5–5.2)
SODIUM: 141 mmol/L (ref 134–144)

## 2016-10-17 MED ORDER — AMLODIPINE BESYLATE 10 MG PO TABS: 10.0000 mg | ORAL_TABLET | Freq: Every day | ORAL | 5 refills | Status: DC

## 2016-10-17 NOTE — Progress Notes (Signed)
Cardiology Office Note   Date:  10/17/2016   ID:  [name], [name] [birth year], MRN [id number]  PCP:  Esaw Grandchild, NP  Cardiologist:  Reola Calkins, Dr Stacy Gardner, PA-C   Chief Complaint  Patient presents with  . New Patient (Initial Visit)    History of Present Illness: Stanley Cook is a 56 y.o. male with a history of NSTEMI in [place] 11/09/2015 s/p cardiac catheterization w/ mild nonobs CAD, NSTEMI felt 2nd HTN urgency, normal EF on echo, deafness, HTN, HLD, Obesity, AKI w/ Cr 2.1 at time of MI 2017, DM, nl EF w/ diast dysfunction on echo 2017  09/30/2016, patient saw PCP and cardiology referral made for general follow-up. Cr 1.75  Stanley Cook presents for cardiology evaluation.   He exercises at [employer]. He is going daily in the summer. He does a machine called the "lateral" for warmup, lifts weights and then does the treadmill for 20-30 minutes.   He is trying to stick tightly to a diabetic diet and drinking a gallon of water daily.   He never gets chest pain.   He never has palpitations. Never has presyncope or syncope.  His BP is high today, at his last office visit in May, his amlodipine was decreased, labetalol discontinued and ramipril 5 mg was added. He has not checked his BP much recently. He has a wrist cuff that gives him error messages regularly.  No LE edema, denies orthopnea or PND. No unusual DOE. Does not think he snores, never been told he has sleep apnea.    Past Medical History:  Diagnosis Date  . Diabetes mellitus type II, non insulin dependent ([place])   . Hyperlipidemia   . Hypertension     History reviewed. No pertinent surgical history.  Medication Sig  . amLODipine (NORVASC) 5 MG tablet Take 1 tablet (5 mg total) by mouth daily.  Marland Kitchen aspirin EC 81 MG tablet Take by mouth.  Marland Kitchen atorvastatin (LIPITOR) 40 MG tablet Take 1 tablet (40 mg total) by mouth at bedtime.  . blood glucose meter kit and supplies KIT Dispense based on  patient and insurance preference. Use up to four times daily as directed. (FOR ICD-9 250.00, 250.01).  . Calcium Carb-Cholecalciferol (CALCIUM 600 + D PO) Take by mouth.  . metFORMIN (GLUCOPHAGE) 500 MG tablet Take 1 tablet (500 mg total) by mouth daily after supper.  . ramipril (ALTACE) 5 MG capsule Take 1 capsule (5 mg total) by mouth daily.   No current facility-administered medications for this visit.     Allergies:   Penicillins    Social History:  The patient  reports that he has never smoked. He has never used smokeless tobacco. He reports that he does not drink alcohol or use drugs.   Family History:  The patient's family history includes Diabetes in his sister; Unexplained death (age of onset: 1) in his father.    ROS:  Please see the history of present illness. All other systems are reviewed and negative.    PHYSICAL EXAM: VS:  BP (!) 166/82   Pulse 73   Ht 5' 9.5" (1.765 m)   Wt 250 lb 3.2 oz (113.5 kg)   BMI 36.42 kg/m  , BMI Body mass index is 36.42 kg/m. GEN: Well nourished, well developed, male in no acute distress  HEENT: normal for age  Neck: no JVD, no carotid bruit, no masses Cardiac: RRR; no murmur, no rubs, or gallops Respiratory:  clear to auscultation bilaterally, normal work of breathing GI: soft, nontender, nondistended, + BS MS: no deformity or atrophy; trace pedal edema; distal pulses are 2+ in all 4 extremities   Skin: warm and dry, no rash Neuro:  Strength and sensation are intact Psych: euthymic mood, full affect   EKG:  EKG is ordered today. The ekg ordered today demonstrates SR, inferior and lateral Q waves, lateral T wave inversions.   ECHO: 11/11/2015 at [facility] in [place] Left ventricular systolic function is normal.There is severe concentric left ventricular hypertrophy.The left ventricular cavity is small. The estimated left ventricular ejection fraction is 60%. Doppler indicates diastolic dysfunction The left atrium  is mildly dilated. No significant regurgitation Insufficient Tricuspid regurgitation to evaluate for PASP/ RVSP. Small pericardial effusion with on obvious significant hemodynamic consequence No previous for comparison Other findings as noted  CATH: 11/15/2015 LEFT VENTRICULOGRAM: Left ventriculography was not performed due to the patient's chronic kidney disease. Left ventricular systolic function is known to be normal with ejection fraction 60% by echocardiography with severe left ventricular hypertrophy. CORONARY ANGIOGRAPHY: Left main coronary artery is large and normal. The left anterior descending artery is large and wraps around the apex and has mild luminal irregularities. There is a diagonal branch with a tubular 40% to 50% mid stenosis that is best suited for medical therapy. No significant obstructive disease. Circumflex artery is large and nondominant with mild luminal irregularities up to 10%, no significant obstructive disease. Right coronary artery is a large, dominant vessel with mild irregularities up to 20%, but no significant obstructive disease. OVERALL IMPRESSION: 1. Mild nonobstructive coronary disease, right dominant anatomy with 40% to 50% tubular diagonal branch vessel stenosis best suited for medical therapy. 2. Normal left ventricular systolic function, ejection fraction 60% by echocardiography. 3. Elevated left ventricular end-diastolic pressure. 4. Severe left ventricular hypertrophy with diastolic dysfunction. 5. Chronic kidney disease, creatinine 2.1. PLAN: 1. D-Stat hemostatic band right radial artery. 2. IV hydration. 3. Optimize medical therapy for his hypertension with amlodipine, carvedilol, clonidine and hydralazine with the addition of ACE inhibitor only if renal function allows. This is deferred to nephrology. 4. Will require chronic diuretic therapy due to his volume overload and diastolic heart failure.  5. Follow up renal function in a.m.  Recent  Labs: 08/29/2016: ALT 16; BUN 23; Creatinine, Ser 1.74; Hemoglobin 15.6; Platelets 194; Potassium 4.8; Sodium 143    Lipid Panel    Component Value Date/Time   CHOL 217 (H) 08/29/2016 0804   TRIG 198 (H) 08/29/2016 0804   HDL 35 (L) 08/29/2016 0804   CHOLHDL 6.2 (H) 08/29/2016 0804   LDLCALC 142 (H) 08/29/2016 0804     Wt Readings from Last 3 Encounters:  10/17/16 250 lb 3.2 oz (113.5 kg)  09/30/16 250 lb (113.4 kg)  08/19/16 251 lb 6.4 oz (114 kg)     Other studies Reviewed: Additional studies/ records that were reviewed today include: PCP visits, Care Everywhere records.  ASSESSMENT AND PLAN: The patient was reviewed with Dr Gwenlyn Found who agrees with the plan.  1. CAD: Non-obstructive dz at cath 2017, EF nl by echo w/ severe LVH. CRF reduction recommended. He is doing well with exercise, dietary changes.  Agree with ASA, statin.  If additional BP control needed, would add Bystolic. ECG is abnl, pt given a copy to carry.  2. HTN: BP is elevated today. Error messages on wrist cuff may have been due to high BP. Will increase amlodipine back up to 10 mg since  he tolerated that well and has the tablets. Further BP management per his PCP.   3. CKD III: Likely 2nd HTN. He will have to reschedule the appt on 08/01, so will ck BMET today. Otherwise, per PCP.  4. Hyperlipidemia: agree w/ high-dose statin. At the time of his MI, LDL was 117, HDL 34. LDL now 142 and HDL 35. Dietary compliance important as well.  Current medicines are reviewed at length with the patient today.  The patient does not have concerns regarding medicines.  The following changes have been made:  Increase amlodipine.  Labs/ tests ordered today include:   Orders Placed This Encounter  Procedures  . EKG 12-Lead     Disposition:   FU with Dr Gwenlyn Found in 1 year.  Augusto Garbe  10/17/2016 11:30 AM    [facility] Phone: [phone]; Fax: [phone]  This note was  written with the assistance of speech recognition software. Please excuse any transcriptional errors.

## 2016-10-17 NOTE — Patient Instructions (Addendum)
Medication Instructions:  INCREASE AMLODIPINE TO 10MG  DAILY  If you need a refill on your cardiac medications before your next appointment, please call your pharmacy.  Labwork: BMET TODAY HERE IN OUR OFFICE AT [facility]  Follow-Up: Your physician wants you to follow-up in: 12 MONTHS WITH DR San MorelleBERRY You will receive a reminder letter in the mail two months in advance. If you don't receive a letter, please call our office MAY 2019 to schedule the July 2019 follow-up appointment.  Special Instructions: BRING WRIST CUFF TO ANY OF YOUR NEXT MD APPT TO CALIBRATE AND SEE IF ACCURATE  Thank you for choosing [facility]!!

## 2016-11-12 ENCOUNTER — Ambulatory Visit: Payer: BC Managed Care – PPO | Admitting: Adult Health

## 2016-12-04 ENCOUNTER — Ambulatory Visit (INDEPENDENT_AMBULATORY_CARE_PROVIDER_SITE_OTHER): Payer: BC Managed Care – PPO | Admitting: Adult Health

## 2016-12-04 ENCOUNTER — Encounter: Payer: Self-pay | Admitting: Adult Health

## 2016-12-04 VITALS — BP 165/84 | HR 81 | Ht 69.5 in | Wt 247.3 lb

## 2016-12-04 DIAGNOSIS — N183 Chronic kidney disease, stage 3 (moderate): Secondary | ICD-10-CM

## 2016-12-04 DIAGNOSIS — N1832 Chronic kidney disease, stage 3b: Secondary | ICD-10-CM | POA: Insufficient documentation

## 2016-12-04 DIAGNOSIS — Z0000 Encounter for general adult medical examination without abnormal findings: Secondary | ICD-10-CM

## 2016-12-04 DIAGNOSIS — E7800 Pure hypercholesterolemia, unspecified: Secondary | ICD-10-CM | POA: Diagnosis not present

## 2016-12-04 DIAGNOSIS — I10 Essential (primary) hypertension: Secondary | ICD-10-CM | POA: Diagnosis not present

## 2016-12-04 DIAGNOSIS — N189 Chronic kidney disease, unspecified: Secondary | ICD-10-CM

## 2016-12-04 DIAGNOSIS — E119 Type 2 diabetes mellitus without complications: Secondary | ICD-10-CM

## 2016-12-04 LAB — POCT GLYCOSYLATED HEMOGLOBIN (HGB A1C): Hemoglobin A1C: 5.9

## 2016-12-04 MED ORDER — ATORVASTATIN CALCIUM 40 MG PO TABS: 40.0000 mg | ORAL_TABLET | Freq: Every day | ORAL | 1 refills | Status: DC

## 2016-12-04 MED ORDER — METFORMIN HCL 500 MG PO TABS: 500.0000 mg | ORAL_TABLET | Freq: Every day | ORAL | 1 refills | Status: DC

## 2016-12-04 NOTE — Patient Instructions (Signed)
Hypertension Hypertension, commonly called high blood pressure, is when the force of blood pumping through the arteries is too strong. The arteries are the blood vessels that carry blood from the heart throughout the body. Hypertension forces the heart to work harder to pump blood and may cause arteries to become narrow or stiff. Having untreated or uncontrolled hypertension can cause heart attacks, strokes, kidney disease, and other problems. A blood pressure reading consists of a higher number over a lower number. Ideally, your blood pressure should be below 120/80. The first ("top") number is called the systolic pressure. It is a measure of the pressure in your arteries as your heart beats. The second ("bottom") number is called the diastolic pressure. It is a measure of the pressure in your arteries as the heart relaxes. What are the causes? The cause of this condition is not known. What increases the risk? Some risk factors for high blood pressure are under your control. Others are not. Factors you can change  Smoking.  Having type 2 diabetes mellitus, high cholesterol, or both.  Not getting enough exercise or physical activity.  Being overweight.  Having too much fat, sugar, calories, or salt (sodium) in your diet.  Drinking too much alcohol. Factors that are difficult or impossible to change  Having chronic kidney disease.  Having a family history of high blood pressure.  Age. Risk increases with age.  Race. You may be at higher risk if you are African-American.  Gender. Men are at higher risk than women before age 45. After age 65, women are at higher risk than men.  Having obstructive sleep apnea.  Stress. What are the signs or symptoms? Extremely high blood pressure (hypertensive crisis) may cause:  Headache.  Anxiety.  Shortness of breath.  Nosebleed.  Nausea and vomiting.  Severe chest pain.  Jerky movements you cannot control (seizures).  How is this  diagnosed? This condition is diagnosed by measuring your blood pressure while you are seated, with your arm resting on a surface. The cuff of the blood pressure monitor will be placed directly against the skin of your upper arm at the level of your heart. It should be measured at least twice using the same arm. Certain conditions can cause a difference in blood pressure between your right and left arms. Certain factors can cause blood pressure readings to be lower or higher than normal (elevated) for a short period of time:  When your blood pressure is higher when you are in a health care provider's office than when you are at home, this is called white coat hypertension. Most people with this condition do not need medicines.  When your blood pressure is higher at home than when you are in a health care provider's office, this is called masked hypertension. Most people with this condition may need medicines to control blood pressure.  If you have a high blood pressure reading during one visit or you have normal blood pressure with other risk factors:  You may be asked to return on a different day to have your blood pressure checked again.  You may be asked to monitor your blood pressure at home for 1 week or longer.  If you are diagnosed with hypertension, you may have other blood or imaging tests to help your health care provider understand your overall risk for other conditions. How is this treated? This condition is treated by making healthy lifestyle changes, such as eating healthy foods, exercising more, and reducing your alcohol intake. Your   health care provider may prescribe medicine if lifestyle changes are not enough to get your blood pressure under control, and if:  Your systolic blood pressure is above 130.  Your diastolic blood pressure is above 80.  Your personal target blood pressure may vary depending on your medical conditions, your age, and other factors. Follow these  instructions at home: Eating and drinking  Eat a diet that is high in fiber and potassium, and low in sodium, added sugar, and fat. An example eating plan is called the DASH (Dietary Approaches to Stop Hypertension) diet. To eat this way: ? Eat plenty of fresh fruits and vegetables. Try to fill half of your plate at each meal with fruits and vegetables. ? Eat whole grains, such as whole wheat pasta, brown rice, or whole grain bread. Fill about one quarter of your plate with whole grains. ? Eat or drink low-fat dairy products, such as skim milk or low-fat yogurt. ? Avoid fatty cuts of meat, processed or cured meats, and poultry with skin. Fill about one quarter of your plate with lean proteins, such as fish, chicken without skin, beans, eggs, and tofu. ? Avoid premade and processed foods. These tend to be higher in sodium, added sugar, and fat.  Reduce your daily sodium intake. Most people with hypertension should eat less than 1,500 mg of sodium a day.  Limit alcohol intake to no more than 1 drink a day for nonpregnant women and 2 drinks a day for men. One drink equals 12 oz of beer, 5 oz of wine, or 1 oz of hard liquor. Lifestyle  Work with your health care provider to maintain a healthy body weight or to lose weight. Ask what an ideal weight is for you.  Get at least 30 minutes of exercise that causes your heart to beat faster (aerobic exercise) most days of the week. Activities may include walking, swimming, or biking.  Include exercise to strengthen your muscles (resistance exercise), such as pilates or lifting weights, as part of your weekly exercise routine. Try to do these types of exercises for 30 minutes at least 3 days a week.  Do not use any products that contain nicotine or tobacco, such as cigarettes and e-cigarettes. If you need help quitting, ask your health care provider.  Monitor your blood pressure at home as told by your health care provider.  Keep all follow-up visits as  told by your health care provider. This is important. Medicines  Take over-the-counter and prescription medicines only as told by your health care provider. Follow directions carefully. Blood pressure medicines must be taken as prescribed.  Do not skip doses of blood pressure medicine. Doing this puts you at risk for problems and can make the medicine less effective.  Ask your health care provider about side effects or reactions to medicines that you should watch for. Contact a health care provider if:  You think you are having a reaction to a medicine you are taking.  You have headaches that keep coming back (recurring).  You feel dizzy.  You have swelling in your ankles.  You have trouble with your vision. Get help right away if:  You develop a severe headache or confusion.  You have unusual weakness or numbness.  You feel faint.  You have severe pain in your chest or abdomen.  You vomit repeatedly.  You have trouble breathing. Summary  Hypertension is when the force of blood pumping through your arteries is too strong. If this condition is not   controlled, it may put you at risk for serious complications.  Your personal target blood pressure may vary depending on your medical conditions, your age, and other factors. For most people, a normal blood pressure is less than 120/80.  Hypertension is treated with lifestyle changes, medicines, or a combination of both. Lifestyle changes include weight loss, eating a healthy, low-sodium diet, exercising more, and limiting alcohol. This information is not intended to replace advice given to you by your health care provider. Make sure you discuss any questions you have with your health care provider. Document Released: 03/31/2005 Document Revised: 02/27/2016 Document Reviewed: 02/27/2016 Elsevier Interactive Patient Education  2018 Elsevier Inc.   Chronic Kidney Disease, Adult Chronic kidney disease (CKD) occurs when the kidneys  are damaged during a period of 3 or more months. The kidneys are two organs that do many important jobs in the body, which include:  Removing wastes and extra fluids from the blood.  Making hormones that maintain the amount of fluid in your tissues and blood vessels.  Maintaining the right amount of fluids and chemicals in the body.  A small amount of kidney damage may not cause problems, but a large amount of damage may make it difficult or impossible for the kidneys to work the way they should. If steps are not taken to slow down the kidney damage or stop it from getting worse, the kidneys may stop working permanently (end stage kidney disease). Most of the time, CKD does not go away, but it can often be controlled. People who have CKD can usually live normal lives. What are the causes? The most common causes of this condition are diabetes and high blood pressure (hypertension). Other causes include:  Heart and blood vessel (cardiovascular) disease.  Kidney diseases: ? Glomerulonephritis. ? Interstitial nephritis. ? Polycystic kidney disease. ? Renal vascular disease.  Diseases that affect the immune system.  Genetic diseases.  Medicines that damage the kidneys, such as anti-inflammatory medicines.  Poisoning.  Being around or in contact with poisonous (toxic) substances.  A kidney or urinary infection that occurs again (recurs).  Vasculitis.  Repeat kidney infections.  A problem with urine flow that may be caused by: ? Cancer. ? Having kidney stones more than one time. ? An enlarged prostate in males.  What increases the risk? This condition is more likely to develop in people who are:  Older than age 26.  Male.  Of African-American descent.  Current smokers or former smokers.  Obese.  You may also have an increased risk for CKD if you have a family history of CKDor youfrequently take medicines that are damaging to the kidneys. What are the signs or  symptoms? Symptoms develop slowly and may not be obvious until the kidney damage becomes severe. It is possible to have a kidney disease for years without showing any symptoms. Symptoms of this condition can include:  Swelling (edema) of the face, legs, ankles, or feet.  Numbness, tingling, or loss of feeling (sensation) in the hands or feet.  Tiredness (lethargy).  Nausea or vomiting.  Confusion or trouble concentrating.  Problems with urination, such as: ? Painful or burning feeling during urination. ? Decreased urine production. ? Frequent urination, especially at night. ? Bloody urine.  Muscle twitches and cramps, especially in the legs.  Shortness of breath.  Weakness.  Constant itchiness.  Loss of appetite.  Metallic taste in the mouth.  Trouble sleeping.  Pale lining of the eyelids and surface of the eye (conjunctiva).  How  is this diagnosed? This condition may be diagnosed with various tests. Tests may include:  Blood tests.  Urine tests.  Imaging tests.  A test in which a sample of tissue is removed from the kidneys to be looked at under a microscope (kidney biopsy).  These test results will help your health care provider determine what class of CKD you have. How is this treated? Most cases of CKD cannot be cured. Treatment usually involves relieving symptoms and preventing or slowing the progression of the disease. Treatment may include:  A special diet, which may require you to avoid alcohol, salty foods (sodium), and foods that are high in potassium, calcium, and protein.  Medicines: ? To lower blood pressure. ? To relieve low blood count (anemia). ? To relieve swelling. ? To protect your bones. ? To improve the balance of electrolytes in your blood.  Removing toxic waste from the body by using hemodialysis or peritoneal dialysis if the kidneys can no longer do their job (kidney failure).  Management of other conditions that are causing your  CKD or making it worse.  Follow these instructions at home:  Follow your prescribed diet.  Take over-the-counter and prescription medicines only as told by your health care provider. ? Do not take any new medicines unless approved by your health care provider. Many medicines can worsen your kidney damage. ? Do not take any vitamin and mineral supplements unless approved by your health care provider. Many nutritional supplements can worsen your kidney damage. ? The dose of some medicines that you take may need to be adjusted.  Do not use any tobacco products, such as cigarettes, chewing tobacco, and e-cigarettes. If you need help quitting, ask your health care provider.  Keep all follow-up visits as told by your health care provider. This is important.  Keep track of your blood pressure. Report changes in your blood pressure as told by your health care provider.  Achieve and maintain a healthy weight. If you need help with this, ask your health care provider.  Start or continue an exercise plan. Try to exercise at least 30 minutes a day, 5 days a week.  Stay current with immunizations as told by your health care provider. Where to find more information:  American Association of Kidney Patients: ResidentialShow.is  [employer]: www.kidney.org  [employer]: FightingMatch.com.ee  [facility]: www.lifeoptions.org and www.kidneyschool.org Contact a health care provider if:  Your symptoms get worse.  You develop new symptoms. Get help right away if:  You develop symptoms of end-stage kidney disease, which include: ? Headaches. ? Abnormally dark or light skin. ? Numbness in the hands or feet. ? Easy bruising. ? Frequent hiccups. ? Chest pain. ? Shortness of breath. ? End of menstruation in women.  You have a fever.  You have decreased urine production.  You have pain or bleeding when you urinate. This information is not intended to  replace advice given to you by your health care provider. Make sure you discuss any questions you have with your health care provider. Document Released: 01/08/2008 Document Revised: 09/06/2015 Document Reviewed: 11/28/2011 Elsevier Interactive Patient Education  2017 [employer].  Continue all medications as directed. Nephrology referral placed to address chronic kidney disease. A1c 5.9 today, reduced from 6.9 in May. GREAT GREAT GREAT JOB ON THE DIET AND EXERCISE! Please schedule complete physical and fasting labs in 3 months. Follow-up with Cardiology as directed.

## 2016-12-04 NOTE — Assessment & Plan Note (Signed)
>>  ASSESSMENT AND PLAN FOR DIABETES MELLITUS WRITTEN ON 12/04/2016  9:16 AM BY DANFORD, KATY D, NP  09/01/16  A1c 6.9 A1c today 5.9-GREAT! Continue healthy eating and regular exercise. Since Creat/GFR slightly improved since May, continue nightly Metformin 500mg  F/u in 3 months.

## 2016-12-04 NOTE — Assessment & Plan Note (Signed)
CPE with lipid panel in 3 months. Continue regular exercise/healthy eating.

## 2016-12-04 NOTE — Assessment & Plan Note (Signed)
Will re-check lipids at CPE in 3 months.

## 2016-12-04 NOTE — Assessment & Plan Note (Signed)
09/01/16 Creat 1.75, GFR 44 10/17/16 Creat 1.63, GFR 47 Nephrology referral placed. Increase water and continue healthy eating/regular exercise. Continue amlodipine 10mg  and ramipril 5mg  daily. BP still above goal, will let Nephrology adjust BP meds.

## 2016-12-04 NOTE — Assessment & Plan Note (Signed)
BP above goal Continue Amlodipine 10mg  and Ramipril mg daily CKD, creat and GFR slightly improved since May, however still abnormal-referral to Nephrology placed.

## 2016-12-04 NOTE — Assessment & Plan Note (Signed)
>>  ASSESSMENT AND PLAN FOR DYSLIPIDEMIA WRITTEN ON 12/04/2016  9:19 AM BY DANFORD, KATY D, NP  Will re-check lipids at CPE in 3 months.

## 2016-12-04 NOTE — Progress Notes (Addendum)
Subjective:    Patient ID: Stanley Cook, male    DOB: Nov 20, 1960, 56 y.o.   MRN: [id number]  HPI:  Stanley Cook is here for f/u: HTN, T2D, CKD.  He has been following a heart healthy diet, drinking "as much water as I can" and denies CP/dyspnea/palpitations/episodes of hypoglycemia.  He was seen by cards and amlodipine dosage was increased back to 1m.  He reports home readings SBP 130-140s, DBP 80-90s.  He denies tobacco/EOTH use. [facility] has not seen Nephrology in >year.    Patient Care Team    Relationship Specialty Notifications Start End  DEsaw Grandchild NP PCP - General Family Medicine  08/19/16     Patient Active Problem List   Diagnosis Date Noted  . CKD (chronic kidney disease) 12/04/2016  . H/O hypertensive crisis 09/30/2016  . Elevated cholesterol 09/30/2016  . Type 2 diabetes mellitus without complication, without long-term current use of insulin ([place] 09/30/2016  . Health care maintenance 08/19/2016  . Hypertension 08/19/2016  . Obesity (BMI 35.0-39.9 without comorbidity) 08/19/2016     Past Medical History:  Diagnosis Date  . Diabetes mellitus type II, non insulin dependent ([place]   . Hyperlipidemia   . Hypertension      History reviewed. No pertinent surgical history.   Family History  Problem Relation Age of Onset  . Unexplained death Father 762      Natural causes, no autopsy  . Diabetes Sister      History  Drug Use No     History  Alcohol Use No     History  Smoking Status  . Never Smoker  Smokeless Tobacco  . Never Used     Outpatient Encounter Prescriptions as of 12/04/2016  Medication Sig  . amLODipine (NORVASC) 10 MG tablet Take 1 tablet (10 mg total) by mouth daily.  .Marland Kitchenaspirin EC 81 MG tablet Take by mouth.  .Marland Kitchenatorvastatin (LIPITOR) 40 MG tablet Take 1 tablet (40 mg total) by mouth at bedtime.  . blood glucose meter kit and supplies KIT Dispense based on patient  and insurance preference. Use up to four times daily as directed. (FOR ICD-9 250.00, 250.01).  . Calcium Carb-Cholecalciferol (CALCIUM 600 + D PO) Take by mouth.  . metFORMIN (GLUCOPHAGE) 500 MG tablet Take 1 tablet (500 mg total) by mouth daily after supper.  . ramipril (ALTACE) 5 MG capsule Take 1 capsule (5 mg total) by mouth daily.  . [DISCONTINUED] atorvastatin (LIPITOR) 40 MG tablet Take 1 tablet (40 mg total) by mouth at bedtime.  . [DISCONTINUED] metFORMIN (GLUCOPHAGE) 500 MG tablet Take 1 tablet (500 mg total) by mouth daily after supper.   No facility-administered encounter medications on file as of 12/04/2016.     Allergies: Penicillins  Body mass index is 36 kg/m.  Blood pressure (!) 165/84, pulse 81, height 5' 9.5" (1.765 m), weight 247 lb 4.8 oz (112.2 kg).     Review of Systems  Constitutional: Positive for fatigue. Negative for activity change, appetite change, chills, diaphoresis, fever and unexpected weight change.  Eyes: Negative for visual disturbance.  Respiratory: Negative for cough, chest tightness, shortness of breath, wheezing and stridor.   Cardiovascular: Positive for leg swelling. Negative for chest pain and palpitations.  Gastrointestinal: Negative for abdominal distention, abdominal pain, blood in stool, constipation, diarrhea, nausea and vomiting.  Endocrine: Negative for cold intolerance, heat intolerance, polydipsia, polyphagia and polyuria.  Genitourinary: Negative for  difficulty urinating, flank pain and hematuria.  Musculoskeletal: Negative for arthralgias, back pain, gait problem, joint swelling, myalgias, neck pain and neck stiffness.  Skin: Negative for color change, pallor, rash and wound.  Neurological: Negative for dizziness, tremors, weakness and light-headedness.  Hematological: Does not bruise/bleed easily.  Psychiatric/Behavioral: Negative for dysphoric mood, self-injury, sleep disturbance and suicidal ideas. The patient is not  nervous/anxious and is not hyperactive.        Objective:   Physical Exam  Constitutional: He is oriented to person, place, and time. He appears well-developed and well-nourished. No distress.  HENT:  Head: Normocephalic and atraumatic.  Right Ear: External ear normal.  Left Ear: External ear normal.  Neck: Normal range of motion. Neck supple.  Cardiovascular: Normal rate, regular rhythm, normal heart sounds and intact distal pulses.   No murmur heard. Pulmonary/Chest: Effort normal and breath sounds normal. No respiratory distress. He has no wheezes. He has no rales. He exhibits no tenderness.  Musculoskeletal: He exhibits edema.       Right ankle: He exhibits swelling.       Left ankle: He exhibits swelling.       Right lower leg: He exhibits swelling.       Left lower leg: He exhibits swelling.       Right foot: There is swelling.       Left foot: There is swelling.  Lymphadenopathy:    He has no cervical adenopathy.  Neurological: He is alert and oriented to person, place, and time.  Skin: Skin is warm and dry. No rash noted. He is not diaphoretic. No erythema. There is pallor.  Psychiatric: He has a normal mood and affect. His behavior is normal. Judgment and thought content normal.  Nursing note and vitals reviewed.         Assessment & Plan:   1. Type 2 diabetes mellitus without complication, without long-term current use of insulin ([place])   2. Chronic kidney disease, unspecified CKD stage   3. Elevated cholesterol   4. Essential hypertension   5. Health care maintenance     Hypertension BP above goal Continue Amlodipine 67m and Ramipril mg daily CKD, creat and GFR slightly improved since May, however still abnormal-referral to Nephrology placed.   Type 2 diabetes mellitus without complication, without long-term current use of insulin ([place] 09/01/16  A1c 6.9 A1c today 5.9-GREAT! Continue healthy eating and regular exercise. Since Creat/GFR slightly improved  since May, continue nightly Metformin 5026mF/u in 3 months.  CKD (chronic kidney disease) 09/01/16 Creat 1.75, GFR 44 10/17/16 Creat 1.63, GFR 47 Nephrology referral placed. Increase water and continue healthy eating/regular exercise. Continue amlodipine 1080mnd ramipril 5mg81mily. BP still above goal, will let Nephrology adjust BP meds.  Health care maintenance CPE with lipid panel in 3 months. Continue regular exercise/healthy eating.  Elevated cholesterol Will re-check lipids at CPE in 3 months.    FOLLOW-UP:  Return in about 3 months (around 03/06/2017) for CPE, Fasting Lab Draw.

## 2016-12-04 NOTE — Assessment & Plan Note (Signed)
09/01/16  A1c 6.9 A1c today 5.9-GREAT! Continue healthy eating and regular exercise. Since Creat/GFR slightly improved since May, continue nightly Metformin 500mg  F/u in 3 months.

## 2016-12-31 ENCOUNTER — Telehealth: Payer: Self-pay

## 2016-12-31 NOTE — Telephone Encounter (Signed)
Received call from [facility] stating that they had attempted multiple times to contact pt to schedule appointment and pt has not returned their calls.  LVM for pt to call our office to discuss the importance of seeing the nephrologist.  Tiajuana Amass, CMA

## 2017-01-01 NOTE — Telephone Encounter (Signed)
Pt states he called [facility] today and has an appt 01/14/17.  Tiajuana Amass, CMA

## 2017-03-04 ENCOUNTER — Ambulatory Visit (INDEPENDENT_AMBULATORY_CARE_PROVIDER_SITE_OTHER): Payer: BC Managed Care – PPO | Admitting: Adult Health

## 2017-03-04 ENCOUNTER — Encounter: Payer: Self-pay | Admitting: Adult Health

## 2017-03-04 VITALS — BP 165/92 | HR 72 | Ht 69.5 in | Wt 251.5 lb

## 2017-03-04 DIAGNOSIS — Z0001 Encounter for general adult medical examination with abnormal findings: Secondary | ICD-10-CM | POA: Diagnosis not present

## 2017-03-04 DIAGNOSIS — N189 Chronic kidney disease, unspecified: Secondary | ICD-10-CM

## 2017-03-04 DIAGNOSIS — I10 Essential (primary) hypertension: Secondary | ICD-10-CM | POA: Diagnosis not present

## 2017-03-04 DIAGNOSIS — Z1211 Encounter for screening for malignant neoplasm of colon: Secondary | ICD-10-CM | POA: Diagnosis not present

## 2017-03-04 DIAGNOSIS — E119 Type 2 diabetes mellitus without complications: Secondary | ICD-10-CM | POA: Diagnosis not present

## 2017-03-04 DIAGNOSIS — E7800 Pure hypercholesterolemia, unspecified: Secondary | ICD-10-CM | POA: Diagnosis not present

## 2017-03-04 LAB — IFOBT (OCCULT BLOOD): IFOBT: NEGATIVE

## 2017-03-04 LAB — POCT GLYCOSYLATED HEMOGLOBIN (HGB A1C): HEMOGLOBIN A1C: 5.6

## 2017-03-04 MED ORDER — FUROSEMIDE 20 MG PO TABS: 20.0000 mg | ORAL_TABLET | Freq: Every day | ORAL | 3 refills | Status: DC

## 2017-03-04 MED ORDER — RAMIPRIL 5 MG PO CAPS: 5.0000 mg | ORAL_CAPSULE | Freq: Every day | ORAL | 1 refills | Status: DC

## 2017-03-04 MED ORDER — RAMIPRIL 10 MG PO CAPS: 10.0000 mg | ORAL_CAPSULE | Freq: Every day | ORAL | 1 refills | Status: DC

## 2017-03-04 NOTE — Assessment & Plan Note (Addendum)
BP above goal 165/92, HR 72 Continue Ramipril 5mg and Amlodipine 10mg daily Start once daily Furosemide (Lasix) 20mg, increase foods rich in potassium (please google list of foods). Instructed to check BP daily bring log with him at f/u in 4 weeks Stressed importance of him seeing Nephrologist for CKD 

## 2017-03-04 NOTE — Assessment & Plan Note (Addendum)
Lipids drawn today Encouraged to increase regular exercise and reduce saturated  Currently on Atorvastatin 40mg  nightly

## 2017-03-04 NOTE — Assessment & Plan Note (Signed)
Nephrology referral placed 8/18 and office has called him to make appt-which he states "I cannot take time off work" Discussed in detail the CKD and possible health complications assoc'd and stressed the importance of follow-up with Nephrology.

## 2017-03-04 NOTE — Progress Notes (Signed)
Subjective:    Patient ID: Stanley Cook, male    DOB: 22-Sep-1960, 56 y.o.   MRN: [id number]  HPI: 12/04/16 OV:  Stanley Cook is here for f/u: HTN, T2D, CKD.  He has been following a heart healthy diet, drinking "as much water as I can" and denies CP/dyspnea/palpitations/episodes of hypoglycemia.  He was seen by cards and amlodipine dosage was increased back to 4m.  He reports home readings SBP 130-140s, DBP 80-90s.  He denies tobacco/EOTH use. [facility] has not seen Nephrology in >year.    03/04/17 OV: Stanley Cook here for CPE.  He reports medication compliance and denies SE.  He has not been checking his blood sugar or BP at home. He denies episodes of hypoglycemia. He has been trying to increase vegetables/lean protein, however he has been consuming fast food on weekends due to travel to visit his mother who is in pulmonary rehab, re: COPD.  He has only been able to hit the gym 1-2 times week- cardio 20-324ms with some light wt training.  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 He estimates to drink at least 70 oz water/day [facility]-  Healthcare maintenance: Colonoscopy- due 2020  Patient Care Team    Relationship Specialty Notifications Start End  DaEsaw GrandchildNP PCP - General Family Medicine  08/19/16     Patient Active Problem List   Diagnosis Date Noted  . Encounter for routine adult physical exam with abnormal findings 03/04/2017  . CKD (chronic kidney disease) 12/04/2016  . H/O hypertensive crisis 09/30/2016  . Elevated cholesterol 09/30/2016  . Type 2 diabetes mellitus without complication, without long-term current use of insulin ([place]/19[place]  . Health care maintenance 08/19/2016  . Hypertension 08/19/2016  . Obesity (BMI 35.0-39.9 without comorbidity) 08/19/2016     Past Medical History:  Diagnosis Date   . Diabetes mellitus type II, non insulin dependent ([facility]  . Hyperlipidemia   . Hypertension      History reviewed. No pertinent surgical history.   Family History  Problem Relation Age of Onset  . Unexplained death Father [id number]     Natural causes, no autopsy  . Diabetes Sister      Social History   Substance and Sexual Activity  Drug Use No     Social History   Substance and Sexual Activity  Alcohol Use No     Social History   Tobacco Use  Smoking Status Never Smoker  Smokeless Tobacco Never Used     Outpatient Encounter Medications as of 03/04/2017  Medication Sig  . amLODipine (NORVASC) 10 MG tablet Take 1 tablet (10 mg total) by mouth daily.  . Marland Kitchenspirin EC 81 MG tablet Take by mouth.  . Marland Kitchentorvastatin (LIPITOR) 40 MG tablet Take 1 tablet (40 mg total) by mouth at bedtime.  . blood glucose meter kit and supplies KIT Dispense based on patient and insurance preference. Use up to four times daily as directed. (FOR ICD-9 250.00, 250.01).  . Calcium Carb-Cholecalciferol (CALCIUM 600 + D PO) Take by mouth.  . metFORMIN (GLUCOPHAGE) 500 MG tablet Take 1 tablet (500 mg total) by mouth daily after supper.  . ramipril (ALTACE) 5 MG capsule Take 1 capsule (5 mg total) by mouth daily.  . [DISCONTINUED] ramipril (ALTACE) 10 MG capsule Take 1 capsule (10 mg total) by mouth daily.  . [  DISCONTINUED] ramipril (ALTACE) 5 MG capsule Take 1 capsule (5 mg total) by mouth daily.  . furosemide (LASIX) 20 MG tablet Take 1 tablet (20 mg total) by mouth daily.   No facility-administered encounter medications on file as of 03/04/2017.     Allergies: Penicillins  Body mass index is 36.61 kg/m.  Blood pressure (!) 165/92, pulse 72, height 5' 9.5" (1.765 m), weight 251 lb 8 oz (114.1 kg).     Review of Systems  Constitutional: Positive for fatigue. Negative for activity change, appetite change, chills, diaphoresis, fever and unexpected weight change.  Eyes: Negative for visual  disturbance.  Respiratory: Negative for cough, chest tightness, shortness of breath, wheezing and stridor.   Cardiovascular: Positive for leg swelling. Negative for chest pain and palpitations.  Gastrointestinal: Negative for abdominal distention, abdominal pain, blood in stool, constipation, diarrhea, nausea and vomiting.  Endocrine: Negative for cold intolerance, heat intolerance, polydipsia, polyphagia and polyuria.  Genitourinary: Negative for difficulty urinating, flank pain and hematuria.  Musculoskeletal: Negative for arthralgias, back pain, gait problem, joint swelling, myalgias, neck pain and neck stiffness.  Skin: Negative for color change, pallor, rash and wound.  Neurological: Negative for dizziness, tremors, weakness and light-headedness.  Hematological: Does not bruise/bleed easily.  Psychiatric/Behavioral: Negative for dysphoric mood, self-injury, sleep disturbance and suicidal ideas. The patient is not nervous/anxious and is not hyperactive.        Objective:   Physical Exam  Constitutional: He is oriented to person, place, and time. He appears well-developed and well-nourished. No distress.  HENT:  Head: Normocephalic and atraumatic.  Right Ear: External ear normal. Tympanic membrane is not perforated and not bulging. Decreased hearing is noted.  Left Ear: External ear normal. Tympanic membrane is not perforated and not bulging. Decreased hearing is noted.  Bil hearing aids Excessive cerumen noted in both canals  Eyes: Conjunctivae are normal. Pupils are equal, round, and reactive to light.  Neck: Normal range of motion. Neck supple.  Cardiovascular: Normal rate, regular rhythm, normal heart sounds and intact distal pulses.  No murmur heard. Pulmonary/Chest: Effort normal and breath sounds normal. No respiratory distress. He has no wheezes. He has no rales. He exhibits no tenderness.  Abdominal: Soft. Bowel sounds are normal. He exhibits no distension and no mass. There  is no tenderness. There is no rebound, no guarding and no CVA tenderness.  Protuberant abdomen  Genitourinary: Rectal exam shows guaiac negative stool.  Musculoskeletal: He exhibits edema.       Right ankle: He exhibits swelling.       Left ankle: He exhibits swelling.       Right lower leg: He exhibits swelling.       Left lower leg: He exhibits swelling.       Right foot: There is swelling.       Left foot: There is swelling.  Lymphadenopathy:    He has no cervical adenopathy.  Neurological: He is alert and oriented to person, place, and time.  Skin: Skin is warm and dry. No rash noted. He is not diaphoretic. No erythema. There is pallor.  Psychiatric: He has a normal mood and affect. His behavior is normal. Judgment and thought content normal.  Nursing note and vitals reviewed.         Assessment & Plan:   1. Screening for colon cancer   2. Type 2 diabetes mellitus without complication, without long-term current use of insulin ([place])   3. Essential hypertension   4. Chronic kidney disease, unspecified CKD  stage   5. Encounter for routine adult physical exam with abnormal findings   6. Elevated cholesterol     Type 2 diabetes mellitus without complication, without long-term current use of insulin ([facility]) A1c- 5.6 Follow diabetic diet and increase regular movement  CKD (chronic kidney disease) Nephrology referral placed 8/18 and office has called him to make appt-which he states "I cannot take time off work" Discussed in detail the CKD and possible health complications assoc'd and stressed the importance of follow-up with Nephrology.   Elevated cholesterol Lipids drawn today Encouraged to increase regular exercise and reduce saturated  Currently on Atorvastatin 73m nightly  Encounter for routine adult physical exam with abnormal findings BP above goal 165/92, HR 72 Continue Ramipril 541mand Amlodipine 1034maily Start once daily Furosemide (Lasix) 34m43mncrease foods  rich in potassium (please google list of foods). Instructed to check BP daily bring log with him at f/u in 4 weeks Stressed importance of him seeing Nephrologist for CKD  Hypertension BP above goal 165/92, HR 72 Continue Ramipril 5mg 38m Amlodipine 10mg 47my Start once daily Furosemide (Lasix) 34mg, 5mease foods rich in potassium (please google list of foods). Instructed to check BP daily bring log with him at f/u in 4 weeks Stressed importance of him seeing Nephrologist for CKD    FOLLOW-UP:  Return in about 4 weeks (around 04/01/2017) for HTN, Regular Follow Up.

## 2017-03-04 NOTE — Assessment & Plan Note (Signed)
>>  ASSESSMENT AND PLAN FOR DIABETES MELLITUS WRITTEN ON 03/04/2017  9:41 AM BY DANFORD, KATY D, NP  A1c- 5.6 Follow diabetic diet and increase regular movement

## 2017-03-04 NOTE — Patient Instructions (Addendum)
Hypertension Hypertension, commonly called high blood pressure, is when the force of blood pumping through the arteries is too strong. The arteries are the blood vessels that carry blood from the heart throughout the body. Hypertension forces the heart to work harder to pump blood and may cause arteries to become narrow or stiff. Having untreated or uncontrolled hypertension can cause heart attacks, strokes, kidney disease, and other problems. A blood pressure reading consists of a higher number over a lower number. Ideally, your blood pressure should be below 120/80. The first ("top") number is called the systolic pressure. It is a measure of the pressure in your arteries as your heart beats. The second ("bottom") number is called the diastolic pressure. It is a measure of the pressure in your arteries as the heart relaxes. What are the causes? The cause of this condition is not known. What increases the risk? Some risk factors for high blood pressure are under your control. Others are not. Factors you can change  Smoking.  Having type 2 diabetes mellitus, high cholesterol, or both.  Not getting enough exercise or physical activity.  Being overweight.  Having too much fat, sugar, calories, or salt (sodium) in your diet.  Drinking too much alcohol. Factors that are difficult or impossible to change  Having chronic kidney disease.  Having a family history of high blood pressure.  Age. Risk increases with age.  Race. You may be at higher risk if you are African-American.  Gender. Men are at higher risk than women before age 45. After age 65, women are at higher risk than men.  Having obstructive sleep apnea.  Stress. What are the signs or symptoms? Extremely high blood pressure (hypertensive crisis) may cause:  Headache.  Anxiety.  Shortness of breath.  Nosebleed.  Nausea and vomiting.  Severe chest pain.  Jerky movements you cannot control (seizures).  How is this  diagnosed? This condition is diagnosed by measuring your blood pressure while you are seated, with your arm resting on a surface. The cuff of the blood pressure monitor will be placed directly against the skin of your upper arm at the level of your heart. It should be measured at least twice using the same arm. Certain conditions can cause a difference in blood pressure between your right and left arms. Certain factors can cause blood pressure readings to be lower or higher than normal (elevated) for a short period of time:  When your blood pressure is higher when you are in a health care provider's office than when you are at home, this is called white coat hypertension. Most people with this condition do not need medicines.  When your blood pressure is higher at home than when you are in a health care provider's office, this is called masked hypertension. Most people with this condition may need medicines to control blood pressure.  If you have a high blood pressure reading during one visit or you have normal blood pressure with other risk factors:  You may be asked to return on a different day to have your blood pressure checked again.  You may be asked to monitor your blood pressure at home for 1 week or longer.  If you are diagnosed with hypertension, you may have other blood or imaging tests to help your health care provider understand your overall risk for other conditions. How is this treated? This condition is treated by making healthy lifestyle changes, such as eating healthy foods, exercising more, and reducing your alcohol intake. Your   health care provider may prescribe medicine if lifestyle changes are not enough to get your blood pressure under control, and if:  Your systolic blood pressure is above 130.  Your diastolic blood pressure is above 80.  Your personal target blood pressure may vary depending on your medical conditions, your age, and other factors. Follow these  instructions at home: Eating and drinking  Eat a diet that is high in fiber and potassium, and low in sodium, added sugar, and fat. An example eating plan is called the DASH (Dietary Approaches to Stop Hypertension) diet. To eat this way: ? Eat plenty of fresh fruits and vegetables. Try to fill half of your plate at each meal with fruits and vegetables. ? Eat whole grains, such as whole wheat pasta, brown rice, or whole grain bread. Fill about one quarter of your plate with whole grains. ? Eat or drink low-fat dairy products, such as skim milk or low-fat yogurt. ? Avoid fatty cuts of meat, processed or cured meats, and poultry with skin. Fill about one quarter of your plate with lean proteins, such as fish, chicken without skin, beans, eggs, and tofu. ? Avoid premade and processed foods. These tend to be higher in sodium, added sugar, and fat.  Reduce your daily sodium intake. Most people with hypertension should eat less than 1,500 mg of sodium a day.  Limit alcohol intake to no more than 1 drink a day for nonpregnant women and 2 drinks a day for men. One drink equals 12 oz of beer, 5 oz of wine, or 1 oz of hard liquor. Lifestyle  Work with your health care provider to maintain a healthy body weight or to lose weight. Ask what an ideal weight is for you.  Get at least 30 minutes of exercise that causes your heart to beat faster (aerobic exercise) most days of the week. Activities may include walking, swimming, or biking.  Include exercise to strengthen your muscles (resistance exercise), such as pilates or lifting weights, as part of your weekly exercise routine. Try to do these types of exercises for 30 minutes at least 3 days a week.  Do not use any products that contain nicotine or tobacco, such as cigarettes and e-cigarettes. If you need help quitting, ask your health care provider.  Monitor your blood pressure at home as told by your health care provider.  Keep all follow-up visits as  told by your health care provider. This is important. Medicines  Take over-the-counter and prescription medicines only as told by your health care provider. Follow directions carefully. Blood pressure medicines must be taken as prescribed.  Do not skip doses of blood pressure medicine. Doing this puts you at risk for problems and can make the medicine less effective.  Ask your health care provider about side effects or reactions to medicines that you should watch for. Contact a health care provider if:  You think you are having a reaction to a medicine you are taking.  You have headaches that keep coming back (recurring).  You feel dizzy.  You have swelling in your ankles.  You have trouble with your vision. Get help right away if:  You develop a severe headache or confusion.  You have unusual weakness or numbness.  You feel faint.  You have severe pain in your chest or abdomen.  You vomit repeatedly.  You have trouble breathing. Summary  Hypertension is when the force of blood pumping through your arteries is too strong. If this condition is not   controlled, it may put you at risk for serious complications.  Your personal target blood pressure may vary depending on your medical conditions, your age, and other factors. For most people, a normal blood pressure is less than 120/80.  Hypertension is treated with lifestyle changes, medicines, or a combination of both. Lifestyle changes include weight loss, eating a healthy, low-sodium diet, exercising more, and limiting alcohol. This information is not intended to replace advice given to you by your health care provider. Make sure you discuss any questions you have with your health care provider. Document Released: 03/31/2005 Document Revised: 02/27/2016 Document Reviewed: 02/27/2016 Elsevier Interactive Patient Education  2018 [employer]   Heart-Healthy Eating Plan Many factors influence your heart health, including  eating and exercise habits. Heart (coronary) risk increases with abnormal blood fat (lipid) levels. Heart-healthy meal planning includes limiting unhealthy fats, increasing healthy fats, and making other small dietary changes. This includes maintaining a healthy body weight to help keep lipid levels within a normal range. What is my plan? Your health care provider recommends that you:  Get no more than ___25__% of the total calories in your daily diet from fat.  Limit your intake of saturated fat to less than ___5___% of your total calories each day.  Limit the amount of cholesterol in your diet to less than __300__ mg per day.  What types of fat should I choose?  Choose healthy fats more often. Choose monounsaturated and polyunsaturated fats, such as olive oil and canola oil, flaxseeds, walnuts, almonds, and seeds.  Eat more omega-3 fats. Good choices include salmon, mackerel, sardines, tuna, flaxseed oil, and ground flaxseeds. Aim to eat fish at least two times each week.  Limit saturated fats. Saturated fats are primarily found in animal products, such as meats, butter, and cream. Plant sources of saturated fats include palm oil, palm kernel oil, and coconut oil.  Avoid foods with partially hydrogenated oils in them. These contain trans fats. Examples of foods that contain trans fats are stick margarine, some tub margarines, cookies, crackers, and other baked goods. What general guidelines do I need to follow?  Check food labels carefully to identify foods with trans fats or high amounts of saturated fat.  Fill one half of your plate with vegetables and green salads. Eat 4-5 servings of vegetables per day. A serving of vegetables equals 1 cup of raw leafy vegetables,  cup of raw or cooked cut-up vegetables, or  cup of vegetable juice.  Fill one fourth of your plate with whole grains. Look for the word "whole" as the first word in the ingredient list.  Fill one fourth of your plate  with lean protein foods.  Eat 4-5 servings of fruit per day. A serving of fruit equals one medium whole fruit,  cup of dried fruit,  cup of fresh, frozen, or canned fruit, or  cup of 100% fruit juice.  Eat more foods that contain soluble fiber. Examples of foods that contain this type of fiber are apples, broccoli, carrots, beans, peas, and barley. Aim to get 20-30 g of fiber per day.  Eat more home-cooked food and less restaurant, buffet, and fast food.  Limit or avoid alcohol.  Limit foods that are high in starch and sugar.  Avoid fried foods.  Cook foods by using methods other than frying. Baking, boiling, grilling, and broiling are all great options. Other fat-reducing suggestions include: ? Removing the skin from poultry. ? Removing all visible fats from meats. ? Skimming the fat off  of stews, soups, and gravies before serving them. ? Steaming vegetables in water or broth.  Lose weight if you are overweight. Losing just 5-10% of your initial body weight can help your overall health and prevent diseases such as diabetes and heart disease.  Increase your consumption of nuts, legumes, and seeds to 4-5 servings per week. One serving of dried beans or legumes equals  cup after being cooked, one serving of nuts equals 1 ounces, and one serving of seeds equals  ounce or 1 tablespoon.  You may need to monitor your salt (sodium) intake, especially if you have high blood pressure. Talk with your health care provider or dietitian to get more information about reducing sodium. What foods can I eat? Grains  Breads, including [place], white, pita, wheat, raisin, rye, oatmeal, and [place]. Tortillas that are neither fried nor made with lard or trans fat. Low-fat rolls, including hotdog and hamburger buns and English muffins. Biscuits. Muffins. Waffles. Pancakes. Light popcorn. Whole-grain cereals. Flatbread. Melba toast. Pretzels. Breadsticks. Rusks. Low-fat snacks and crackers, including  oyster, saltine, matzo, graham, animal, and rye. Rice and pasta, including brown rice and those that are made with whole wheat. Vegetables All vegetables. Fruits All fruits, but limit coconut. Meats and Other Protein Sources Lean, well-trimmed beef, veal, pork, and lamb. Chicken and [place] without skin. All fish and shellfish. Wild duck, rabbit, pheasant, and venison. Egg whites or low-cholesterol egg substitutes. Dried beans, peas, lentils, and tofu.Seeds and most nuts. Dairy Low-fat or nonfat cheeses, including ricotta, string, and mozzarella. Skim or 1% milk that is liquid, powdered, or evaporated. Buttermilk that is made with low-fat milk. Nonfat or low-fat yogurt. Beverages Mineral water. Diet carbonated beverages. Sweets and Desserts Sherbets and fruit ices. Honey, jam, marmalade, jelly, and syrups. Meringues and gelatins. Pure sugar candy, such as hard candy, jelly beans, gumdrops, mints, marshmallows, and small amounts of dark chocolate. [employer] Eat all sweets and desserts in moderation. Fats and Oils Nonhydrogenated (trans-free) margarines. Vegetable oils, including soybean, sesame, sunflower, olive, peanut, safflower, corn, canola, and cottonseed. Salad dressings or mayonnaise that are made with a vegetable oil. Limit added fats and oils that you use for cooking, baking, salads, and as spreads. Other Cocoa powder. Coffee and tea. All seasonings and condiments. The items listed above may not be a complete list of recommended foods or beverages. Contact your dietitian for more options. What foods are not recommended? Grains Breads that are made with saturated or trans fats, oils, or whole milk. Croissants. Butter rolls. Cheese breads. Sweet rolls. Donuts. Buttered popcorn. Chow mein noodles. High-fat crackers, such as cheese or butter crackers. Meats and Other Protein Sources Fatty meats, such as hotdogs, short ribs, sausage, spareribs, bacon, ribeye roast or steak, and  mutton. High-fat deli meats, such as salami and bologna. Caviar. Domestic duck and goose. Organ meats, such as kidney, liver, sweetbreads, brains, gizzard, chitterlings, and heart. Dairy Cream, sour cream, cream cheese, and creamed cottage cheese. Whole milk cheeses, including blue (bleu), Monte[place] Jack, [place], [place], [place], [place], Swiss, [place], [place], and [place]. Whole or 2% milk that is liquid, evaporated, or condensed. Whole buttermilk. Cream sauce or high-fat cheese sauce. Yogurt that is made from whole milk. Beverages Regular sodas and drinks with added sugar. Sweets and Desserts Frosting. Pudding. Cookies. Cakes other than angel food cake. Candy that has milk chocolate or white chocolate, hydrogenated fat, butter, coconut, or unknown ingredients. Buttered syrups. Full-fat ice cream or ice cream drinks. Fats and Oils Gravy that has suet,  meat fat, or shortening. Cocoa butter, hydrogenated oils, palm oil, coconut oil, palm kernel oil. These can often be found in baked products, candy, fried foods, nondairy creamers, and whipped toppings. Solid fats and shortenings, including bacon fat, salt pork, lard, and butter. Nondairy cream substitutes, such as coffee creamers and sour cream substitutes. Salad dressings that are made of unknown oils, cheese, or sour cream. The items listed above may not be a complete list of foods and beverages to avoid. Contact your dietitian for more information. This information is not intended to replace advice given to you by your health care provider. Make sure you discuss any questions you have with your health care provider. Document Released: 01/08/2008 Document Revised: 10/19/2015 Document Reviewed: 09/22/2013 Elsevier Interactive Patient Education  2017 [employer].   Exercising to [employer] Exercising can help you to lose weight. In order to lose weight through exercise, you need to do vigorous-intensity exercise. You can tell that you are  exercising with vigorous intensity if you are breathing very hard and fast and cannot hold a conversation while exercising. Moderate-intensity exercise helps to maintain your current weight. You can tell that you are exercising at a moderate level if you have a higher heart rate and faster breathing, but you are still able to hold a conversation. How often should I exercise? Choose an activity that you enjoy and set realistic goals. Your health care provider can help you to make an activity plan that works for you. Exercise regularly as directed by your health care provider. This may include:  Doing resistance training twice each week, such as: ? Push-ups. ? Sit-ups. ? Lifting weights. ? Using resistance bands.  Doing a given intensity of exercise for a given amount of time. Choose from these options: ? 150 minutes of moderate-intensity exercise every week. ? 75 minutes of vigorous-intensity exercise every week. ? A mix of moderate-intensity and vigorous-intensity exercise every week.  Children, pregnant women, people who are out of shape, people who are overweight, and older adults may need to consult a health care provider for individual recommendations. If you have any sort of medical condition, be sure to consult your health care provider before starting a new exercise program. What are some activities that can help me to lose weight?  Walking at a rate of at least 4.5 miles an hour.  Jogging or running at a rate of 5 miles per hour.  Biking at a rate of at least 10 miles per hour.  Lap swimming.  Roller-skating or in-line skating.  Cross-country skiing.  Vigorous competitive sports, such as football, basketball, and soccer.  Jumping rope.  Aerobic dancing. How can I be more active in my day-to-day activities?  Use the stairs instead of the elevator.  Take a walk during your lunch break.  If you drive, park your car farther away from work or school.  If you take public  transportation, get off one stop early and walk the rest of the way.  Make all of your phone calls while standing up and walking around.  Get up, stretch, and walk around every 30 minutes throughout the day. What guidelines should I follow while exercising?  Do not exercise so much that you hurt yourself, feel dizzy, or get very short of breath.  Consult your health care provider prior to starting a new exercise program.  Wear comfortable clothes and shoes with good support.  Drink plenty of water while you exercise to prevent dehydration or heat stroke. Body  water is lost during exercise and must be replaced.  Work out until you breathe faster and your heart beats faster. This information is not intended to replace advice given to you by your health care provider. Make sure you discuss any questions you have with your health care provider. Document Released: 05/03/2010 Document Revised: 09/06/2015 Document Reviewed: 09/01/2013 Elsevier Interactive Patient Education  2018 [employer]    Continue all medications as previous, with one change- Start once daily Furosemide (Lasix) 20mg , increase foods rich in potassium (please google list of foods). Check BP daily and bring log with you to follow-up in 4 weeks. VERY important that you make appt with Nephrologist ASAP to address chronic kidney disease. Increase water intake, strive for at least gallon/day.   Follow Heart Healthy diet Increase regular exercise.  Recommend at least 30 minutes daily, 5 days per week of walking, jogging, biking, swimming, YouTube/Pinterest workout videos. NICE TO SEE YOU!

## 2017-03-04 NOTE — Assessment & Plan Note (Signed)
A1c- 5.6 Follow diabetic diet and increase regular movement

## 2017-03-04 NOTE — Assessment & Plan Note (Signed)
BP above goal 165/92, HR 72 Continue Ramipril 5mg  and Amlodipine 10mg  daily Start once daily Furosemide (Lasix) 20mg , increase foods rich in potassium (please google list of foods). Instructed to check BP daily bring log with him at f/u in 4 weeks Stressed importance of him seeing Nephrologist for CKD

## 2017-03-04 NOTE — Assessment & Plan Note (Signed)
>>  ASSESSMENT AND PLAN FOR DYSLIPIDEMIA WRITTEN ON 03/04/2017 10:08 AM BY DANFORD, KATY D, NP  Lipids drawn today Encouraged to increase regular exercise and reduce saturated  Currently on Atorvastatin 40mg  nightly

## 2017-03-05 LAB — LIPID PANEL
CHOLESTEROL TOTAL: 126 mg/dL (ref 100–199)
Chol/HDL Ratio: 3 ratio (ref 0.0–5.0)
HDL: 42 mg/dL (ref >39)
LDL CALC: 64 mg/dL (ref 0–99)
Triglycerides: 98 mg/dL (ref 0–149)
VLDL CHOLESTEROL CAL: 20 mg/dL (ref 5–40)

## 2017-03-05 LAB — COMPREHENSIVE METABOLIC PANEL
A/G RATIO: 1.8 (ref 1.2–2.2)
ALK PHOS: 89 IU/L (ref 39–117)
ALT: 13 IU/L (ref 0–44)
AST: 25 IU/L (ref 0–40)
Albumin: 4.6 g/dL (ref 3.5–5.5)
BUN/Creatinine Ratio: 13 (ref 9–20)
BUN: 25 mg/dL — ABNORMAL HIGH (ref 6–24)
Bilirubin Total: 0.4 mg/dL (ref 0.0–1.2)
CHLORIDE: 102 mmol/L (ref 96–106)
CO2: 24 mmol/L (ref 20–29)
CREATININE: 1.87 mg/dL — AB (ref 0.76–1.27)
Calcium: 9.5 mg/dL (ref 8.7–10.2)
GFR calc Af Amer: 46 mL/min/{1.73_m2} — ABNORMAL LOW (ref >59)
GFR calc non Af Amer: 40 mL/min/{1.73_m2} — ABNORMAL LOW (ref >59)
GLOBULIN, TOTAL: 2.5 g/dL (ref 1.5–4.5)
Glucose: 113 mg/dL — ABNORMAL HIGH (ref 65–99)
POTASSIUM: 4.6 mmol/L (ref 3.5–5.2)
SODIUM: 140 mmol/L (ref 134–144)
Total Protein: 7.1 g/dL (ref 6.0–8.5)

## 2017-03-09 ENCOUNTER — Other Ambulatory Visit: Payer: Self-pay | Admitting: Adult Health

## 2017-03-09 MED ORDER — METFORMIN HCL 500 MG PO TABS: ORAL_TABLET | ORAL | 1 refills | Status: DC

## 2017-04-08 ENCOUNTER — Encounter: Payer: Self-pay | Admitting: Adult Health

## 2017-04-08 ENCOUNTER — Ambulatory Visit: Payer: BC Managed Care – PPO | Admitting: Adult Health

## 2017-04-08 VITALS — BP 180/70 | HR 81 | Ht 69.5 in | Wt 253.0 lb

## 2017-04-08 DIAGNOSIS — I10 Essential (primary) hypertension: Secondary | ICD-10-CM

## 2017-04-08 DIAGNOSIS — N189 Chronic kidney disease, unspecified: Secondary | ICD-10-CM

## 2017-04-08 MED ORDER — RAMIPRIL 2.5 MG PO CAPS
5.0000 mg | ORAL_CAPSULE | Freq: Two times a day (BID) | ORAL | 1 refills | Status: DC
Start: 2017-04-08 — End: 2017-04-09

## 2017-04-08 MED ORDER — FUROSEMIDE 20 MG PO TABS: ORAL_TABLET | ORAL | 1 refills | Status: DC

## 2017-04-08 NOTE — Progress Notes (Signed)
Subjective:    Patient ID: Stanley Cook, male    DOB: [birth year], 56 y.o.   MRN: [id number]  HPI: 12/04/16 OV:  Stanley Cook is here for f/u: HTN, T2D, CKD.  He has been following a heart healthy diet, drinking "as much water as I can" and denies CP/dyspnea/palpitations/episodes of hypoglycemia.  He was seen by cards and amlodipine dosage was increased back to 54m.  He reports home readings SBP 130-140s, DBP 80-90s.  He denies tobacco/EOTH use. He thinks [facility] however has not seen Nephrology in >year.    03/04/17 OV: Stanley Cook here for CPE.  He reports medication compliance and denies SE.  He has not been checking his blood sugar or BP at home. He denies episodes of hypoglycemia. He has been trying to increase vegetables/lean protein, however he has been consuming fast food on weekends due to travel to visit his mother who is in pulmonary rehab, re: COPD.  He has only been able to hit the gym 1-2 times week- cardio 20-349ms with some light wt training.  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 He estimates to drink at least 70 oz water/day He continues to abstain from [place]phrology referral placed 12/04/16 to address CKD-  Healthcare maintenance: Colonoscopy- due 2020  04/08/17 OV: Stanley Cook here for f/u: HTN Bp well above goal in November and started on Furosemide 2063mnce daily. Nephrology referral placed 12/04/16 and he finally has an appt 04/29/17- please KEEP it. Home BP readings- SBP 130-150, DBP 80-90s Due to work and family issues (mother recently placed put of town nursing home) he has been only able to go to gym once a week and he has been eating a lot of fast food. He reports taking all medication as directed and denies SE. He denies CP/dypspnea/palpitations/dizziness.  Patient Care Team    Relationship Specialty Notifications Start End  DanEsaw GrandchildP PCP - General Family Medicine  08/19/16      Patient Active Problem List   Diagnosis Date Noted  . Encounter for routine adult physical exam with abnormal findings 03/04/2017  . CKD (chronic kidney disease) 12/04/2016  . H/O hypertensive crisis 09/30/2016  . Elevated cholesterol 09/30/2016  . Type 2 diabetes mellitus without complication, without long-term current use of insulin (HCCKatherine6/19/2018  . Health care maintenance 08/19/2016  . Hypertension 08/19/2016  . Obesity (BMI 35.0-39.9 without comorbidity) 08/19/2016     Past Medical History:  Diagnosis Date  . Diabetes mellitus type II, non insulin dependent (HCCDoral . Hyperlipidemia   . Hypertension      History reviewed. No pertinent surgical history.   Family History  Problem Relation Age of Onset  . Unexplained death Father 76 39    Natural causes, no autopsy  . Diabetes Sister      Social History   Substance and Sexual Activity  Drug Use No     Social History   Substance and Sexual Activity  Alcohol Use No     Social History   Tobacco Use  Smoking Status Never Smoker  Smokeless Tobacco Never Used     Outpatient Encounter Medications as of 04/08/2017  Medication Sig  . amLODipine (NORVASC) 10 MG tablet Take 1 tablet (10 mg total) by mouth daily.  . aMarland Kitchenpirin EC 81 MG tablet Take by mouth.  . aMarland Kitchenorvastatin (LIPITOR) 40 MG tablet Take 1 tablet (40 mg total) by mouth  at bedtime.  . blood glucose meter kit and supplies KIT Dispense based on patient and insurance preference. Use up to four times daily as directed. (FOR ICD-9 250.00, 250.01).  . Calcium Carb-Cholecalciferol (CALCIUM 600 + D PO) Take by mouth.  . furosemide (LASIX) 20 MG tablet 1 tab in the morning and 1/2 tab in the evening- total of 79m daily  . metFORMIN (GLUCOPHAGE) 500 MG tablet 1/2 tab nightly  . ramipril (ALTACE) 2.5 MG capsule Take 2 capsules (5 mg total) by mouth 2 (two) times daily.  . [DISCONTINUED] furosemide (LASIX) 20 MG tablet Take 1 tablet (20 mg total) by mouth  daily.  . [DISCONTINUED] ramipril (ALTACE) 5 MG capsule Take 1 capsule (5 mg total) by mouth daily.   No facility-administered encounter medications on file as of 04/08/2017.     Allergies: Penicillins  Body mass index is 36.83 kg/m.  Blood pressure (!) 180/70, pulse 81, height 5' 9.5" (1.765 m), weight 253 lb (114.8 kg), SpO2 97 %.     Review of Systems  Constitutional: Positive for fatigue. Negative for activity change, appetite change, chills, diaphoresis, fever and unexpected weight change.  Eyes: Negative for visual disturbance.  Respiratory: Negative for cough, chest tightness, shortness of breath, wheezing and stridor.   Cardiovascular: Positive for leg swelling. Negative for chest pain and palpitations.  Gastrointestinal: Negative for abdominal distention, abdominal pain, blood in stool, constipation, diarrhea, nausea and vomiting.  Endocrine: Negative for cold intolerance, heat intolerance, polydipsia, polyphagia and polyuria.  Genitourinary: Negative for difficulty urinating, flank pain and hematuria.  Musculoskeletal: Negative for arthralgias, back pain, gait problem, joint swelling, myalgias, neck pain and neck stiffness.  Skin: Negative for color change, pallor, rash and wound.  Neurological: Negative for dizziness, tremors, weakness and light-headedness.  Hematological: Does not bruise/bleed easily.  Psychiatric/Behavioral: Negative for dysphoric mood, self-injury, sleep disturbance and suicidal ideas. The patient is not nervous/anxious and is not hyperactive.        Objective:   Physical Exam  Constitutional: He is oriented to person, place, and time. He appears well-developed and well-nourished. No distress.  HENT:  Head: Normocephalic and atraumatic.  Right Ear: External ear normal. Tympanic membrane is not perforated and not bulging. Decreased hearing is noted.  Left Ear: External ear normal. Tympanic membrane is not perforated and not bulging. Decreased  hearing is noted.  Bil hearing aids Excessive cerumen noted in both canals  Eyes: Conjunctivae are normal. Pupils are equal, round, and reactive to light.  Neck: Normal range of motion. Neck supple.  Cardiovascular: Normal rate, regular rhythm, normal heart sounds and intact distal pulses.  No murmur heard. Pulmonary/Chest: Effort normal and breath sounds normal. No respiratory distress. He has no wheezes. He has no rales. He exhibits no tenderness.  Abdominal: Soft. Bowel sounds are normal. He exhibits no distension and no mass. There is no tenderness. There is no rebound, no guarding and no CVA tenderness.  Protuberant abdomen  Genitourinary: Rectal exam shows guaiac negative stool.  Musculoskeletal: He exhibits edema.       Right ankle: He exhibits swelling.       Left ankle: He exhibits swelling.       Right lower leg: He exhibits swelling.       Left lower leg: He exhibits swelling.       Right foot: There is swelling.       Left foot: There is swelling.  Lymphadenopathy:    He has no cervical adenopathy.  Neurological: He is alert  and oriented to person, place, and time.  Skin: Skin is warm and dry. No rash noted. He is not diaphoretic. No erythema. There is pallor.  Psychiatric: He has a normal mood and affect. His behavior is normal. Judgment and thought content normal.  Nursing note and vitals reviewed.         Assessment & Plan:   1. Chronic kidney disease, unspecified CKD stage   2. Essential hypertension     CKD (chronic kidney disease) He has appt with Nephrologist 04/29/17- KEEP IT! Last GFR 11/18- 40, so won't increase ACE Med Changes today: Please continue once daily Amlodipine (Norvasc) 81m, and start taking Ramipril (Altace) 2.551mtwice daily- to equal 96m47motal daily. Take Furosemide (Lasix) 82m52m1 tab in the morning and 1/2 tab in evening- to equal 30mg33mal daily.   Hypertension Both BP checks well above goal- SBP 180s, DBP 90s HR 80s Med  adjustment today: Please continue once daily Amlodipine (Norvasc) 10mg,62m start taking  Ramipril (Altace) 2.96mg tw101m daily- to equal 96mg tot86mdaily. Take Furosemide (Lasix) 82mg - 160m in the morning and 1/2 tab in evening- to equal 30mg tota696mily. Eat diet rich in potassium and increase regular exercise. Last Creat Nov 2018- 40, therefore cannot increase ACE IMPERATIVE he keep Nephrology appt in Jan 2019 Continue to check BP at home and f/u in 4 weeks      FOLLOW-UP:  Return in about 4 weeks (around 05/06/2017).

## 2017-04-08 NOTE — Assessment & Plan Note (Addendum)
Both BP checks well above goal- SBP 180s, DBP 90s HR 80s Med adjustment today: Please continue once daily Amlodipine (Norvasc) 10mg , and start taking  Ramipril (Altace) 2.5mg  twice daily- to equal 5mg  total daily. Take Furosemide (Lasix) 20mg  - 1 tab in the morning and 1/2 tab in evening- to equal 30mg  total daily. Eat diet rich in potassium and increase regular exercise. Last Creat Nov 2018- 40, therefore cannot increase ACE IMPERATIVE he keep Nephrology appt in Jan 2019 Continue to check BP at home and f/u in 4 weeks

## 2017-04-08 NOTE — Patient Instructions (Addendum)
Hypertension Hypertension, commonly called high blood pressure, is when the force of blood pumping through the arteries is too strong. The arteries are the blood vessels that carry blood from the heart throughout the body. Hypertension forces the heart to work harder to pump blood and may cause arteries to become narrow or stiff. Having untreated or uncontrolled hypertension can cause heart attacks, strokes, kidney disease, and other problems. A blood pressure reading consists of a higher number over a lower number. Ideally, your blood pressure should be below 120/80. The first ("top") number is called the systolic pressure. It is a measure of the pressure in your arteries as your heart beats. The second ("bottom") number is called the diastolic pressure. It is a measure of the pressure in your arteries as the heart relaxes. What are the causes? The cause of this condition is not known. What increases the risk? Some risk factors for high blood pressure are under your control. Others are not. Factors you can change  Smoking.  Having type 2 diabetes mellitus, high cholesterol, or both.  Not getting enough exercise or physical activity.  Being overweight.  Having too much fat, sugar, calories, or salt (sodium) in your diet.  Drinking too much alcohol. Factors that are difficult or impossible to change  Having chronic kidney disease.  Having a family history of high blood pressure.  Age. Risk increases with age.  Race. You may be at higher risk if you are African-American.  Gender. Men are at higher risk than women before age 45. After age 65, women are at higher risk than men.  Having obstructive sleep apnea.  Stress. What are the signs or symptoms? Extremely high blood pressure (hypertensive crisis) may cause:  Headache.  Anxiety.  Shortness of breath.  Nosebleed.  Nausea and vomiting.  Severe chest pain.  Jerky movements you cannot control (seizures).  How is this  diagnosed? This condition is diagnosed by measuring your blood pressure while you are seated, with your arm resting on a surface. The cuff of the blood pressure monitor will be placed directly against the skin of your upper arm at the level of your heart. It should be measured at least twice using the same arm. Certain conditions can cause a difference in blood pressure between your right and left arms. Certain factors can cause blood pressure readings to be lower or higher than normal (elevated) for a short period of time:  When your blood pressure is higher when you are in a health care provider's office than when you are at home, this is called white coat hypertension. Most people with this condition do not need medicines.  When your blood pressure is higher at home than when you are in a health care provider's office, this is called masked hypertension. Most people with this condition may need medicines to control blood pressure.  If you have a high blood pressure reading during one visit or you have normal blood pressure with other risk factors:  You may be asked to return on a different day to have your blood pressure checked again.  You may be asked to monitor your blood pressure at home for 1 week or longer.  If you are diagnosed with hypertension, you may have other blood or imaging tests to help your health care provider understand your overall risk for other conditions. How is this treated? This condition is treated by making healthy lifestyle changes, such as eating healthy foods, exercising more, and reducing your alcohol intake. Your   health care provider may prescribe medicine if lifestyle changes are not enough to get your blood pressure under control, and if:  Your systolic blood pressure is above 130.  Your diastolic blood pressure is above 80.  Your personal target blood pressure may vary depending on your medical conditions, your age, and other factors. Follow these  instructions at home: Eating and drinking  Eat a diet that is high in fiber and potassium, and low in sodium, added sugar, and fat. An example eating plan is called the DASH (Dietary Approaches to Stop Hypertension) diet. To eat this way: ? Eat plenty of fresh fruits and vegetables. Try to fill half of your plate at each meal with fruits and vegetables. ? Eat whole grains, such as whole wheat pasta, brown rice, or whole grain bread. Fill about one quarter of your plate with whole grains. ? Eat or drink low-fat dairy products, such as skim milk or low-fat yogurt. ? Avoid fatty cuts of meat, processed or cured meats, and poultry with skin. Fill about one quarter of your plate with lean proteins, such as fish, chicken without skin, beans, eggs, and tofu. ? Avoid premade and processed foods. These tend to be higher in sodium, added sugar, and fat.  Reduce your daily sodium intake. Most people with hypertension should eat less than 1,500 mg of sodium a day.  Limit alcohol intake to no more than 1 drink a day for nonpregnant women and 2 drinks a day for men. One drink equals 12 oz of beer, 5 oz of wine, or 1 oz of hard liquor. Lifestyle  Work with your health care provider to maintain a healthy body weight or to lose weight. Ask what an ideal weight is for you.  Get at least 30 minutes of exercise that causes your heart to beat faster (aerobic exercise) most days of the week. Activities may include walking, swimming, or biking.  Include exercise to strengthen your muscles (resistance exercise), such as pilates or lifting weights, as part of your weekly exercise routine. Try to do these types of exercises for 30 minutes at least 3 days a week.  Do not use any products that contain nicotine or tobacco, such as cigarettes and e-cigarettes. If you need help quitting, ask your health care provider.  Monitor your blood pressure at home as told by your health care provider.  Keep all follow-up visits as  told by your health care provider. This is important. Medicines  Take over-the-counter and prescription medicines only as told by your health care provider. Follow directions carefully. Blood pressure medicines must be taken as prescribed.  Do not skip doses of blood pressure medicine. Doing this puts you at risk for problems and can make the medicine less effective.  Ask your health care provider about side effects or reactions to medicines that you should watch for. Contact a health care provider if:  You think you are having a reaction to a medicine you are taking.  You have headaches that keep coming back (recurring).  You feel dizzy.  You have swelling in your ankles.  You have trouble with your vision. Get help right away if:  You develop a severe headache or confusion.  You have unusual weakness or numbness.  You feel faint.  You have severe pain in your chest or abdomen.  You vomit repeatedly.  You have trouble breathing. Summary  Hypertension is when the force of blood pumping through your arteries is too strong. If this condition is not   controlled, it may put you at risk for serious complications.  Your personal target blood pressure may vary depending on your medical conditions, your age, and other factors. For most people, a normal blood pressure is less than 120/80.  Hypertension is treated with lifestyle changes, medicines, or a combination of both. Lifestyle changes include weight loss, eating a healthy, low-sodium diet, exercising more, and limiting alcohol. This information is not intended to replace advice given to you by your health care provider. Make sure you discuss any questions you have with your health care provider. Document Released: 03/31/2005 Document Revised: 02/27/2016 Document Reviewed: 02/27/2016 Elsevier Interactive Patient Education  2018 [employer]  Please eat a diet high in potassium rich foods- Bananas, oranges, cantaloupe,  honeydew, apricots, grapefruit (some dried fruits, such as prunes, raisins, and dates, are also high in potassium) Cooked spinach. Cooked broccoli. Potatoes. Sweet potatoes. Mushrooms. Peas. Cucumbers. Eating guide per [employer].gov provided.  Please reduce fast food and food high in sodium.   Since blood pressure remains well above goal will need to adjust meds. Please continue once daily Amlodipine (Norvasc) 10mg , and start taking  Ramipril (Altace) 2.5mg  twice daily- to equal 5mg  total daily. Take Furosemide (Lasix) 20mg  - 1 tab in the morning and 1/2 tab in evening- to equal 30mg  total daily. Eat diet rich in potassium and increase regular exercise. Please keep your Nephrology appt 04/29/17 Please keep checking your BP at home. PLEASE CALL WITH ANY MEDICATION QUESTIONS. Follow-up in 4 weeks.  Please follow-up in 4 weeks, sooner if needed.

## 2017-04-08 NOTE — Assessment & Plan Note (Addendum)
He has appt with Nephrologist 04/29/17- KEEP IT! Last GFR 11/18- 40, so won't increase ACE Med Changes today: Please continue once daily Amlodipine (Norvasc) 10mg , and start taking Ramipril (Altace) 2.5mg  twice daily- to equal 5mg  total daily. Take Furosemide (Lasix) 20mg  - 1 tab in the morning and 1/2 tab in evening- to equal 30mg  total daily.

## 2017-04-09 ENCOUNTER — Telehealth: Payer: Self-pay | Admitting: Adult Health

## 2017-04-09 ENCOUNTER — Other Ambulatory Visit: Payer: Self-pay | Admitting: Adult Health

## 2017-04-09 LAB — BASIC METABOLIC PANEL
BUN / CREAT RATIO: 16 (ref 9–20)
BUN: 31 mg/dL — AB (ref 6–24)
CALCIUM: 9.1 mg/dL (ref 8.7–10.2)
CHLORIDE: 102 mmol/L (ref 96–106)
CO2: 22 mmol/L (ref 20–29)
Creatinine, Ser: 1.99 mg/dL — ABNORMAL HIGH (ref 0.76–1.27)
GFR, EST AFRICAN AMERICAN: 43 mL/min/{1.73_m2} — AB (ref >59)
GFR, EST NON AFRICAN AMERICAN: 37 mL/min/{1.73_m2} — AB (ref >59)
Glucose: 146 mg/dL — ABNORMAL HIGH (ref 65–99)
Potassium: 4.7 mmol/L (ref 3.5–5.2)
Sodium: 143 mmol/L (ref 134–144)

## 2017-04-09 MED ORDER — RAMIPRIL 2.5 MG PO CAPS: 2.5000 mg | ORAL_CAPSULE | Freq: Two times a day (BID) | ORAL | 1 refills | Status: DC

## 2017-04-09 NOTE — Telephone Encounter (Signed)
Good Evening Tonya, Can you please call Mr. Stanley Cook and tell him to take ONE 2.5mg  tab every 12 hrs- to total 5mg  daily I have it updated in the system, it was incorrectly entered yesterday. Sorry for the confusion. Thanks! Orpha BurKaty

## 2017-04-09 NOTE — Telephone Encounter (Signed)
Please advise.  T. Christyl Osentoski, CMA 

## 2017-04-09 NOTE — Telephone Encounter (Signed)
Pt called states medication form is capsules:  Pt says instruction read to cut pill in half-- take 1/2 in morning & the other half in afternoon ---Medication is a capsule & he is confused pls call him at [phone].  ramipril (ALTACE) 2.5 MG capsule [[id number]][[id number]]  Order Details  Dose: 5 mg Route: Oral Frequency: 2 times daily  Indications of Use: Chronic Renal Disease, Hypertension  Dispense Quantity: 60 capsule Refills: 1 Fills remaining: --        Sig: Take 2 capsules (5 mg total) by mouth 2 (two) times daily.

## 2017-04-09 NOTE — Telephone Encounter (Signed)
Pt informed.  Pt expressed understanding and is agreeable.  T. Dore Oquin, CMA  

## 2017-04-27 ENCOUNTER — Telehealth: Payer: Self-pay

## 2017-04-27 NOTE — Telephone Encounter (Signed)
Received Epic notification that pt has not read MyChart message regarding results.    From Stan HeadNelson, Tonya S, CMA To Venita LickJohn W Mia Sent 04/09/2017 2:47 PM  Dear Mr. Stanley MinerHarry,   Katy asked that I inform you that your glucose was elevated, but you were not fasting at the time we obtained your blood work  BUN/Creatinine levels (kidney) continue to climb so please be sure to keep your upcoming appointment with Nephrology on 04/29/17.  Electrolytes were normal  If you have any questions/concerns regarding these results, please do not hesitate to call our office.   Wishing you well,  Archie Pattenonya, CMA for  William HamburgerKaty Danford, NP

## 2017-04-27 NOTE — Telephone Encounter (Signed)
LVM for pt to call to discuss results.  T. Nelson, CMA  

## 2017-04-28 NOTE — Telephone Encounter (Signed)
LVM for pt to call to discuss results.  T. Jadene Stemmer, CMA  

## 2017-04-29 LAB — BASIC METABOLIC PANEL
BUN: 33 — AB (ref 4–21)
Creatinine: 2.1 — AB (ref <=1.3)
GLUCOSE: 98
Potassium: 4.5 (ref 3.4–5.3)
Sodium: 137 (ref 137–147)

## 2017-04-30 NOTE — Telephone Encounter (Signed)
Letter mailed to pt.  Stanley Cook. Darly Fails, CMA

## 2017-05-04 ENCOUNTER — Encounter: Payer: Self-pay | Admitting: Adult Health

## 2017-05-04 ENCOUNTER — Other Ambulatory Visit: Payer: Self-pay | Admitting: Adult Health

## 2017-05-04 ENCOUNTER — Ambulatory Visit: Payer: BC Managed Care – PPO | Admitting: Adult Health

## 2017-05-04 VITALS — BP 145/76 | HR 73 | Ht 69.5 in | Wt 252.0 lb

## 2017-05-04 DIAGNOSIS — N189 Chronic kidney disease, unspecified: Secondary | ICD-10-CM

## 2017-05-04 DIAGNOSIS — I10 Essential (primary) hypertension: Secondary | ICD-10-CM | POA: Diagnosis not present

## 2017-05-04 DIAGNOSIS — E559 Vitamin D deficiency, unspecified: Secondary | ICD-10-CM | POA: Insufficient documentation

## 2017-05-04 DIAGNOSIS — E785 Hyperlipidemia, unspecified: Secondary | ICD-10-CM | POA: Diagnosis not present

## 2017-05-04 DIAGNOSIS — Z0000 Encounter for general adult medical examination without abnormal findings: Secondary | ICD-10-CM

## 2017-05-04 DIAGNOSIS — E119 Type 2 diabetes mellitus without complications: Secondary | ICD-10-CM

## 2017-05-04 NOTE — Assessment & Plan Note (Signed)
>>  ASSESSMENT AND PLAN FOR DIABETES MELLITUS WRITTEN ON 05/04/2017  9:31 AM BY DANFORD, KATY D, NP  Last A1c 5.6 on 03/04/17 Currently taking Metformin 500mg  1/2 tab nightly Denies episodes of hypoglycemia

## 2017-05-04 NOTE — Assessment & Plan Note (Signed)
Last A1c 5.6 on 03/04/17 Currently taking Metformin 500mg  1/2 tab nightly Denies episodes of hypoglycemia

## 2017-05-04 NOTE — Assessment & Plan Note (Signed)
Atorvastatin 40mg  daily, follow heart healthy diet and increase regular exercise. 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

## 2017-05-04 NOTE — Patient Instructions (Addendum)
Heart-Healthy Eating Plan Many factors influence your heart health, including eating and exercise habits. Heart (coronary) risk increases with abnormal blood fat (lipid) levels. Heart-healthy meal planning includes limiting unhealthy fats, increasing healthy fats, and making other small dietary changes. This includes maintaining a healthy body weight to help keep lipid levels within a normal range. What is my plan? Your health care provider recommends that you:  Get no more than ___25__% of the total calories in your daily diet from fat.  Limit your intake of saturated fat to less than ___5___% of your total calories each day.  Limit the amount of cholesterol in your diet to less than __300__ mg per day.  What types of fat should I choose?  Choose healthy fats more often. Choose monounsaturated and polyunsaturated fats, such as olive oil and canola oil, flaxseeds, walnuts, almonds, and seeds.  Eat more omega-3 fats. Good choices include salmon, mackerel, sardines, tuna, flaxseed oil, and ground flaxseeds. Aim to eat fish at least two times each week.  Limit saturated fats. Saturated fats are primarily found in animal products, such as meats, butter, and cream. Plant sources of saturated fats include palm oil, palm kernel oil, and coconut oil.  Avoid foods with partially hydrogenated oils in them. These contain trans fats. Examples of foods that contain trans fats are stick margarine, some tub margarines, cookies, crackers, and other baked goods. What general guidelines do I need to follow?  Check food labels carefully to identify foods with trans fats or high amounts of saturated fat.  Fill one half of your plate with vegetables and green salads. Eat 4-5 servings of vegetables per day. A serving of vegetables equals 1 cup of raw leafy vegetables,  cup of raw or cooked cut-up vegetables, or  cup of vegetable juice.  Fill one fourth of your plate with whole grains. Look for the word "whole"  as the first word in the ingredient list.  Fill one fourth of your plate with lean protein foods.  Eat 4-5 servings of fruit per day. A serving of fruit equals one medium whole fruit,  cup of dried fruit,  cup of fresh, frozen, or canned fruit, or  cup of 100% fruit juice.  Eat more foods that contain soluble fiber. Examples of foods that contain this type of fiber are apples, broccoli, carrots, beans, peas, and barley. Aim to get 20-30 g of fiber per day.  Eat more home-cooked food and less restaurant, buffet, and fast food.  Limit or avoid alcohol.  Limit foods that are high in starch and sugar.  Avoid fried foods.  Cook foods by using methods other than frying. Baking, boiling, grilling, and broiling are all great options. Other fat-reducing suggestions include: ? Removing the skin from poultry. ? Removing all visible fats from meats. ? Skimming the fat off of stews, soups, and gravies before serving them. ? Steaming vegetables in water or broth.  Lose weight if you are overweight. Losing just 5-10% of your initial body weight can help your overall health and prevent diseases such as diabetes and heart disease.  Increase your consumption of nuts, legumes, and seeds to 4-5 servings per week. One serving of dried beans or legumes equals  cup after being cooked, one serving of nuts equals 1 ounces, and one serving of seeds equals  ounce or 1 tablespoon.  You may need to monitor your salt (sodium) intake, especially if you have high blood pressure. Talk with your health care provider or dietitian to get  more information about reducing sodium. What foods can I eat? Grains  Breads, including [place], white, pita, wheat, raisin, rye, oatmeal, and [place]. Tortillas that are neither fried nor made with lard or trans fat. Low-fat rolls, including hotdog and hamburger buns and English muffins. Biscuits. Muffins. Waffles. Pancakes. Light popcorn. Whole-grain cereals. Flatbread. Melba  toast. Pretzels. Breadsticks. Rusks. Low-fat snacks and crackers, including oyster, saltine, matzo, graham, animal, and rye. Rice and pasta, including brown rice and those that are made with whole wheat. Vegetables All vegetables. Fruits All fruits, but limit coconut. Meats and Other Protein Sources Lean, well-trimmed beef, veal, pork, and lamb. Chicken and [place] without skin. All fish and shellfish. Wild duck, rabbit, pheasant, and venison. Egg whites or low-cholesterol egg substitutes. Dried beans, peas, lentils, and tofu.Seeds and most nuts. Dairy Low-fat or nonfat cheeses, including ricotta, string, and mozzarella. Skim or 1% milk that is liquid, powdered, or evaporated. Buttermilk that is made with low-fat milk. Nonfat or low-fat yogurt. Beverages Mineral water. Diet carbonated beverages. Sweets and Desserts Sherbets and fruit ices. Honey, jam, marmalade, jelly, and syrups. Meringues and gelatins. Pure sugar candy, such as hard candy, jelly beans, gumdrops, mints, marshmallows, and small amounts of dark chocolate. [employer] Eat all sweets and desserts in moderation. Fats and Oils Nonhydrogenated (trans-free) margarines. Vegetable oils, including soybean, sesame, sunflower, olive, peanut, safflower, corn, canola, and cottonseed. Salad dressings or mayonnaise that are made with a vegetable oil. Limit added fats and oils that you use for cooking, baking, salads, and as spreads. Other Cocoa powder. Coffee and tea. All seasonings and condiments. The items listed above may not be a complete list of recommended foods or beverages. Contact your dietitian for more options. What foods are not recommended? Grains Breads that are made with saturated or trans fats, oils, or whole milk. Croissants. Butter rolls. Cheese breads. Sweet rolls. Donuts. Buttered popcorn. Chow mein noodles. High-fat crackers, such as cheese or butter crackers. Meats and Other Protein Sources Fatty meats, such as  hotdogs, short ribs, sausage, spareribs, bacon, ribeye roast or steak, and mutton. High-fat deli meats, such as salami and bologna. Caviar. Domestic duck and goose. Organ meats, such as kidney, liver, sweetbreads, brains, gizzard, chitterlings, and heart. Dairy Cream, sour cream, cream cheese, and creamed cottage cheese. Whole milk cheeses, including blue (bleu), Monterey Jack, [place], [place], American, [place], Swiss, [place], [place], and [place]. Whole or 2% milk that is liquid, evaporated, or condensed. Whole buttermilk. Cream sauce or high-fat cheese sauce. Yogurt that is made from whole milk. Beverages Regular sodas and drinks with added sugar. Sweets and Desserts Frosting. Pudding. Cookies. Cakes other than angel food cake. Candy that has milk chocolate or white chocolate, hydrogenated fat, butter, coconut, or unknown ingredients. Buttered syrups. Full-fat ice cream or ice cream drinks. Fats and Oils Gravy that has suet, meat fat, or shortening. Cocoa butter, hydrogenated oils, palm oil, coconut oil, palm kernel oil. These can often be found in baked products, candy, fried foods, nondairy creamers, and whipped toppings. Solid fats and shortenings, including bacon fat, salt pork, lard, and butter. Nondairy cream substitutes, such as coffee creamers and sour cream substitutes. Salad dressings that are made of unknown oils, cheese, or sour cream. The items listed above may not be a complete list of foods and beverages to avoid. Contact your dietitian for more information. This information is not intended to replace advice given to you by your health care provider. Make sure you discuss any questions you have with your health care  provider. Document Released: 01/08/2008 Document Revised: 10/19/2015 Document Reviewed: 09/22/2013 Elsevier Interactive Patient Education  2018 [employer]    Increase water intake, strive for at least 125 ounces/day.   Follow Heart Healthy diet Increase regular  exercise.  Recommend at least 30 minutes daily, 5 days per week of walking, jogging, biking, swimming, YouTube/Pinterest workout videos. Please continue all medications as directed and follow-up with Nephrologist and Cardiologist as directed. Please follow-up here in 6 months. NICE TO SEE YOU!

## 2017-05-04 NOTE — Progress Notes (Addendum)
Subjective:    Patient ID: Stanley Cook, male    DOB: [birth year], 57 y.o.   MRN: [id number]  HPI: 12/04/16 OV:  Stanley Cook is here for f/u: HTN, T2D, CKD.  He has been following a heart healthy diet, drinking "as much water as I can" and denies CP/dyspnea/palpitations/episodes of hypoglycemia.  He was seen by cards and amlodipine dosage was increased back to 55m.  He reports home readings SBP 130-140s, DBP 80-90s.  He denies tobacco/EOTH use. [facility] has not seen Nephrology in >year.    03/04/17 OV: Stanley Cook here for CPE.  He reports medication compliance and denies SE.  He has not been checking his blood sugar or BP at home. He denies episodes of hypoglycemia. He has been trying to increase vegetables/lean protein, however he has been consuming fast food on weekends due to travel to visit his mother who is in pulmonary rehab, re: COPD.  He has only been able to hit the gym 1-2 times week- cardio 20-380ms with some light wt training.  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 He estimates to drink at least 70 oz water/day He [facility]16 to address CKD-  Healthcare maintenance: Colonoscopy- due 2020  04/08/17 OV: Stanley Cook here for f/u: HTN Bp well above goal in November and started on Furosemide 2033mnce daily. Nephrology referral placed 12/04/16 and he finally has an appt 04/29/17- please KEEP it. Home BP readings- SBP 130-150, DBP 80-90s Due to work and family issues (mother recently placed put of town nursing home) he has been only able to go to gym once a week and he has been eating a lot of fast food. He reports taking all medication as directed and denies SE. He denies CP/dypspnea/palpitations/dizziness.  05/04/17 OV: Stanley Cook here for f/u: HTN He was seen by Nephrology last week and antihypertensives were adjusted: Furosemide  increased from 9m85m 80mg35mly Ramipril increased from 2.5mg B50mto 5 mg BID He has been increasing water intake and trying to follow heart healthy diet. 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 We reviewed most recent BMP results and he reports Nephrology obtained labs/urine specimen last week- will request results.   Patient Care Team    Relationship Specialty Notifications Start End  [facility] - General Family Medicine  08/19/16     Patient Active Problem List   Diagnosis Date Noted  . Vitamin D deficiency 05/04/2017  . Encounter for routine adult physical exam with abnormal findings 03/04/2017  . CKD (chronic kidney disease) 12/04/2016  . H/O hypertensive crisis 09/30/2016  . Hyperlipidemia 09/30/2016  . Type 2 diabetes mellitus without complication, without long-term current use of insulin (HCC) 0[place]9/2018  . Health care maintenance 08/19/2016  . Hypertension 08/19/2016  . Obesity (BMI 35.0-39.9 without comorbidity) 08/19/2016     Past Medical History:  Diagnosis Date  . Diabetes mellitus type II, non insulin dependent (HCC)  OriskaHyperlipidemia   . Hypertension      History reviewed. No pertinent surgical history.   Family History  Problem Relation Age of Onset  . Unexplained death Father 76    42 Natural causes, no autopsy  . Diabetes Sister      Social History  Substance and Sexual Activity  Drug Use No     Social History   Substance and Sexual Activity  Alcohol Use No     Social History   Tobacco Use  Smoking Status Never Smoker  Smokeless Tobacco Never Used     Outpatient Encounter Medications as of 05/04/2017  Medication Sig  . amLODipine (NORVASC) 10 MG tablet Take 1 tablet (10 mg total) by mouth daily.  Marland Kitchen aspirin EC 81 MG tablet Take by mouth.  Marland Kitchen atorvastatin (LIPITOR) 40 MG tablet Take 1 tablet (40 mg total) by  mouth at bedtime.  . blood glucose meter kit and supplies KIT Dispense based on patient and insurance preference. Use up to four times daily as directed. (FOR ICD-9 250.00, 250.01).  . Calcium Carb-Cholecalciferol (CALCIUM 600 + D PO) Take by mouth.  . furosemide (LASIX) 80 MG tablet Take 1 tablet by mouth daily.  . metFORMIN (GLUCOPHAGE) 500 MG tablet 1/2 tab nightly  . ramipril (ALTACE) 5 MG capsule Take 1 capsule by mouth daily.  . [DISCONTINUED] furosemide (LASIX) 20 MG tablet 1 tab in the morning and 1/2 tab in the evening- total of 27m daily  . [DISCONTINUED] ramipril (ALTACE) 2.5 MG capsule Take 1 capsule (2.5 mg total) by mouth 2 (two) times daily.   No facility-administered encounter medications on file as of 05/04/2017.     Allergies: Penicillins  Body mass index is 36.68 kg/m.  Blood pressure (!) 145/76, pulse 73, height 5' 9.5" (1.765 m), weight 252 lb (114.3 kg), SpO2 99 %.     Review of Systems  Constitutional: Positive for fatigue. Negative for activity change, appetite change, chills, diaphoresis, fever and unexpected weight change.  Eyes: Negative for visual disturbance.  Respiratory: Negative for cough, chest tightness, shortness of breath, wheezing and stridor.   Cardiovascular: Positive for leg swelling. Negative for chest pain and palpitations.  Gastrointestinal: Negative for abdominal distention, abdominal pain, blood in stool, constipation, diarrhea, nausea and vomiting.  Endocrine: Negative for cold intolerance, heat intolerance, polydipsia, polyphagia and polyuria.  Genitourinary: Negative for difficulty urinating, flank pain and hematuria.  Musculoskeletal: Negative for arthralgias, back pain, gait problem, joint swelling, myalgias, neck pain and neck stiffness.  Skin: Negative for color change, pallor, rash and wound.  Neurological: Negative for dizziness, tremors, weakness and light-headedness.  Hematological: Does not bruise/bleed easily.   Psychiatric/Behavioral: Negative for dysphoric mood, self-injury, sleep disturbance and suicidal ideas. The patient is not nervous/anxious and is not hyperactive.        Objective:   Physical Exam  Constitutional: He is oriented to person, place, and time. He appears well-developed and well-nourished. No distress.  HENT:  Head: Normocephalic and atraumatic.  Right Ear: External ear normal. Tympanic membrane is not perforated and not bulging. Decreased hearing is noted.  Left Ear: External ear normal. Tympanic membrane is not perforated and not bulging. Decreased hearing is noted.  Bil hearing aids Excessive cerumen noted in both canals  Eyes: Conjunctivae are normal. Pupils are equal, round, and reactive to light.  Neck: Normal range of motion. Neck supple.  Cardiovascular: Normal rate, regular rhythm, normal heart sounds and intact distal pulses.  No murmur heard. Pulmonary/Chest: Effort normal and breath sounds normal. No respiratory distress. He has no wheezes. He has no rales. He exhibits no tenderness.  Abdominal: Soft. Bowel sounds are normal. He exhibits no distension and no mass. There is no tenderness. There is no rebound, no guarding and no CVA tenderness.  Protuberant abdomen  Genitourinary: Rectal exam  shows guaiac negative stool.  Musculoskeletal: He exhibits edema.       Right ankle: He exhibits swelling.       Left ankle: He exhibits swelling.       Right lower leg: He exhibits swelling.       Left lower leg: He exhibits swelling.       Right foot: There is swelling.       Left foot: There is swelling.  LLE edema > RLE, however reduced from last OV  Lymphadenopathy:    He has no cervical adenopathy.  Neurological: He is alert and oriented to person, place, and time.  Skin: Skin is warm and dry. No rash noted. He is not diaphoretic. No erythema. There is pallor.  Psychiatric: He has a normal mood and affect. His behavior is normal. Judgment and thought content normal.   Nursing note and vitals reviewed.         Assessment & Plan:   1. Essential hypertension   2. Hyperlipidemia, unspecified hyperlipidemia type   3. Chronic kidney disease, unspecified CKD stage   4. Type 2 diabetes mellitus without complication, without long-term current use of insulin ([place])   5. Health care maintenance   6. Vitamin D deficiency     Hypertension BP much improved- 145/76, HR 73 He was seen by Nephrology last week and BP medications adjusted, now taking- Ramipril 43m daily, Furosemide 80g daily, Amlodipine 16mdaily He reports home SBP 110-150s, DBP 90-100s He denies CP/dyspnea at rest/palpitations.   Hyperlipidemia Atorvastatin 4041maily, follow heart healthy diet and increase regular exercise. 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  Type 2 diabetes mellitus without complication, without long-term current use of insulin (HCC) Last A1c 5.6 on 03/04/17 Currently taking Metformin 500m67m2 tab nightly Denies episodes of hypoglycemia    FOLLOW-UP:  Return in about 6 months (around 11/01/2017) for Regular Follow Up, HTN, Hypercholestermia, Diabetes.

## 2017-05-04 NOTE — Assessment & Plan Note (Signed)
BP much improved- 145/76, HR 73 He was seen by Nephrology last week and BP medications adjusted, now taking- Ramipril 5mg  daily, Furosemide 80g daily, Amlodipine 10mg  daily He reports home SBP 110-150s, DBP 90-100s He denies CP/dyspnea at rest/palpitations.

## 2017-05-04 NOTE — Assessment & Plan Note (Signed)
>>  ASSESSMENT AND PLAN FOR DYSLIPIDEMIA WRITTEN ON 05/04/2017  9:24 AM BY DANFORD, KATY D, NP  Atorvastatin 40mg  daily, follow heart healthy diet and increase regular exercise. 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

## 2017-07-02 LAB — BASIC METABOLIC PANEL
BUN: 39 — AB (ref 4–21)
CREATININE: 2.2 — AB (ref <=1.3)
Glucose: 111
POTASSIUM: 4 (ref 3.4–5.3)
SODIUM: 131 — AB (ref 137–147)

## 2017-08-11 ENCOUNTER — Other Ambulatory Visit: Payer: Self-pay | Admitting: Adult Health

## 2017-08-11 ENCOUNTER — Encounter: Payer: Self-pay | Admitting: Adult Health

## 2017-08-11 ENCOUNTER — Ambulatory Visit: Payer: BC Managed Care – PPO

## 2017-08-11 ENCOUNTER — Ambulatory Visit (INDEPENDENT_AMBULATORY_CARE_PROVIDER_SITE_OTHER): Payer: BC Managed Care – PPO | Admitting: Adult Health

## 2017-08-11 VITALS — BP 136/75 | HR 72 | Temp 98.3°F | Ht 69.5 in | Wt 254.8 lb

## 2017-08-11 DIAGNOSIS — M79671 Pain in right foot: Secondary | ICD-10-CM

## 2017-08-11 DIAGNOSIS — M25571 Pain in right ankle and joints of right foot: Secondary | ICD-10-CM | POA: Diagnosis not present

## 2017-08-11 MED ORDER — METFORMIN HCL 500 MG PO TABS: ORAL_TABLET | ORAL | 1 refills | Status: DC

## 2017-08-11 NOTE — Patient Instructions (Addendum)
Ankle Pain Many things can cause ankle pain, including an injury to the area and overuse of the ankle.The ankle joint holds your body weight and allows you to move around. Ankle pain can occur on either side or the back of one ankle or both ankles. Ankle pain may be sharp and burning or dull and aching. There may be tenderness, stiffness, redness, or warmth around the ankle. Follow these instructions at home: Activity  Rest your ankle as told by your health care provider. Avoid any activities that cause ankle pain.  Do exercises as told by your health care provider.  Ask your health care provider if you can drive. Using a brace, a bandage, or crutches  If you were given a brace: ? Wear it as told by your health care provider. ? Remove it when you take a bath or a shower. ? Try not to move your ankle very much, but wiggle your toes from time to time. This helps to prevent swelling.  If you were given an elastic bandage: ? Remove it when you take a bath or a shower. ? Try not to move your ankle very much, but wiggle your toes from time to time. This helps to prevent swelling. ? Adjust the bandage to make it more comfortable if it feels too tight. ? Loosen the bandage if you have numbness or tingling in your foot or if your foot turns cold and blue.  If you have crutches, use them as told by your health care provider. Continue to use them until you can walk without feeling pain in your ankle. Managing pain, stiffness, and swelling  Raise (elevate) your ankle above the level of your heart while you are sitting or lying down.  If directed, apply ice to the area: ? Put ice in a plastic bag. ? Place a towel between your skin and the bag. ? Leave the ice on for 20 minutes, 2-3 times per day. General instructions  Keep all follow-up visits as told by your health care provider. This is important.  Record this information that may be helpful for you and your health care provider: ? How  often you have ankle pain. ? Where the pain is located. ? What the pain feels like.  Take over-the-counter and prescription medicines only as told by your health care provider. Contact a health care provider if:  Your pain gets worse.  Your pain is not relieved with medicines.  You have a fever or chills.  You are having more trouble with walking.  You have new symptoms. Get help right away if:  Your foot, leg, toes, or ankle tingles or becomes numb.  Your foot, leg, toes, or ankle becomes swollen.  Your foot, leg, toes, or ankle turns pale or blue. This information is not intended to replace advice given to you by your health care provider. Make sure you discuss any questions you have with your health care provider. Document Released: 09/18/2009 Document Revised: 11/30/2015 Document Reviewed: 10/31/2014 Elsevier Interactive Patient Education  2018 [employer].  Please use Ace Wrap when you are awake, remove at night. Use ice and rest ankle as often as you can. Continue to use crutch until symptoms subside. Recommend Ibuprofen  every 8 hrs with food, as needed for pain/swelling. We will call when lab results are available. If symptoms do not improve in 2 weeks, please call clinic. FEEL BETTER!

## 2017-08-11 NOTE — Assessment & Plan Note (Addendum)
R ankle Xray- Please use Ace Wrap when you are awake, remove at night. Use ice and rest ankle as often as you can. Continue to use crutch until symptoms subside. Recommend Ibuprofen  every 8 hrs with food, as needed for pain/swelling. We will call when lab results are available. If symptoms do not improve in 2 weeks, please call clinic.

## 2017-08-11 NOTE — Progress Notes (Signed)
Subjective:    Patient ID: Stanley Cook, male    DOB: [birth year], 57 y.o.   MRN: [id number]  HPI:  Stanley Cook presents with R ankle pain that began at 0430 this morning when he stepped out of bed. He denies acute injury/accident prior to onset of sx's. Pain is intermittent, begins over R lateral malleolus and will radiate to medial malleolus.  Pain is described as "sharp" and rated 9/10. He denies edema or warmth over R ankle. He denies personal or family hx of Gout.  He reports eating a large amount of shrimp/mussels last weekend. He took one 81 mg ASA this morning and has been ambulating with one crutch. He denies numbness/tingling in R foot He denies pain in R knee. He reports that he had similar R ankle pain at age 64 r/t sports injury.  Patient Care Team    Relationship Specialty Notifications Start End  Esaw Grandchild, NP PCP - General Family Medicine  08/19/16     Patient Active Problem List   Diagnosis Date Noted  . Vitamin D deficiency 05/04/2017  . Encounter for routine adult physical exam with abnormal findings 03/04/2017  . CKD (chronic kidney disease) 12/04/2016  . H/O hypertensive crisis 09/30/2016  . Hyperlipidemia 09/30/2016  . Type 2 diabetes mellitus without complication, without long-term current use of insulin ([place]) 09/30/2016  . Health care maintenance 08/19/2016  . Hypertension 08/19/2016  . Obesity (BMI 35.0-39.9 without comorbidity) 08/19/2016     Past Medical History:  Diagnosis Date  . Diabetes mellitus type II, non insulin dependent ([place])   . Hyperlipidemia   . Hypertension      History reviewed. No pertinent surgical history.   Family History  Problem Relation Age of Onset  . Unexplained death Father 65       Natural causes, no autopsy  . Diabetes Sister      Social History   Substance and Sexual Activity  Drug Use No     Social History   Substance and Sexual Activity  Alcohol Use No     Social History   Tobacco Use   Smoking Status Never Smoker  Smokeless Tobacco Never Used     Outpatient Encounter Medications as of 08/11/2017  Medication Sig  . amLODipine (NORVASC) 10 MG tablet Take 1 tablet (10 mg total) by mouth daily.  Marland Kitchen aspirin EC 81 MG tablet Take by mouth.  Marland Kitchen atorvastatin (LIPITOR) 40 MG tablet Take 1 tablet (40 mg total) by mouth at bedtime.  . blood glucose meter kit and supplies KIT Dispense based on patient and insurance preference. Use up to four times daily as directed. (FOR ICD-9 250.00, 250.01).  . Calcium Carb-Cholecalciferol (CALCIUM 600 + D PO) Take by mouth.  . furosemide (LASIX) 80 MG tablet Take 1 tablet by mouth daily.  . metFORMIN (GLUCOPHAGE) 500 MG tablet 1/2 tab nightly  . ramipril (ALTACE) 5 MG capsule Take 1 capsule by mouth daily.  . [DISCONTINUED] metFORMIN (GLUCOPHAGE) 500 MG tablet 1/2 tab nightly   No facility-administered encounter medications on file as of 08/11/2017.     Allergies: Penicillins  Body mass index is 37.09 kg/m.  Blood pressure (!) 153/80, pulse 72, temperature 98.3 F (36.8 C), temperature source Oral, height 5' 9.5" (1.765 m), weight 254 lb 12.8 oz (115.6 kg), SpO2 98 %.  Review of Systems  Constitutional: Positive for activity change and fatigue. Negative for appetite change, chills, diaphoresis, fever and unexpected weight change.  Musculoskeletal: Positive for arthralgias, gait  problem, joint swelling and myalgias.  Hematological: Does not bruise/bleed easily.       Objective:   Physical Exam  Constitutional: He appears well-developed and well-nourished. No distress.  HENT:  Head: Normocephalic and atraumatic.  Right Ear: External ear normal.  Left Ear: External ear normal.  Eyes: Pupils are equal, round, and reactive to light. EOM are normal.  Musculoskeletal: He exhibits tenderness. He exhibits no edema or deformity.       Right knee: Normal.       Right ankle: He exhibits decreased range of motion and abnormal pulse. He  exhibits no swelling, no ecchymosis, no deformity and no laceration. Tenderness. Lateral malleolus and medial malleolus tenderness found. No CF ligament tenderness found. Achilles tendon exhibits no pain, no defect and normal Thompson's test results.       Left ankle: Normal.  Skin: Skin is warm and dry. No rash noted. He is not diaphoretic. No erythema. No pallor.      Assessment & Plan:   1. Foot pain, right   2. Acute right ankle pain     Acute right ankle pain R ankle Xray- Please use Ace Wrap when you are awake, remove at night. Use ice and rest ankle as often as you can. Continue to use crutch until symptoms subside. Recommend Ibuprofen 835m every 8 hrs with food, as needed for pain/swelling. We will call when lab results are available. If symptoms do not improve in 2 weeks, please call clinic.   FOLLOW-UP:  Return if symptoms worsen or fail to improve.

## 2017-08-12 ENCOUNTER — Other Ambulatory Visit: Payer: Self-pay | Admitting: Adult Health

## 2017-08-12 LAB — URIC ACID: URIC ACID: 11.5 mg/dL — AB (ref 3.7–8.6)

## 2017-08-12 MED ORDER — COLCHICINE 0.6 MG PO TABS: ORAL_TABLET | ORAL | 0 refills | Status: DC

## 2017-08-12 NOTE — Progress Notes (Signed)
Cr CL 57- Do not need to renally dose Colchicine Take two tablets immediately, then wait an hr and take one tablet- max daily dose 1.8mg  Continue NSAIDS- push water

## 2017-08-20 ENCOUNTER — Other Ambulatory Visit: Payer: Self-pay | Admitting: Adult Health

## 2017-08-21 ENCOUNTER — Telehealth: Payer: Self-pay | Admitting: Adult Health

## 2017-08-21 NOTE — Telephone Encounter (Signed)
Please review patient's metformin prescription. It is in his chart as ordered but he has gone to the pharmacy a couple times and every time he is told they do not have it. I see where it was ordered on 08/11/17 but there is no confirmed receipt by pharm. I told patient we could get this fixed today for him and he just wished for a call back when it is done, if states if we get his VM to leave information there.

## 2017-08-21 NOTE — Telephone Encounter (Signed)
RX dated 08/11/17 was marked "no print".  Therefore, RX for metformin called to pharmacy.  LVM informing pt.  Tiajuana Amass, CMA

## 2017-10-07 LAB — BASIC METABOLIC PANEL
BUN: 36 — AB (ref 4–21)
Creatinine: 2.2 — AB (ref <=1.3)
Glucose: 141
Potassium: 4.2 (ref 3.4–5.3)
SODIUM: 135 — AB (ref 137–147)

## 2017-10-07 LAB — ESTIMATED GFR: GFR CALC NON AF AMER: 32

## 2017-10-29 NOTE — Progress Notes (Signed)
Subjective:    Patient ID: Stanley Cook, male    DOB: 20-Oct-1960, 57 y.o.   MRN: [id number]  HPI: 12/04/16 OV:  Stanley Cook is here for f/u: HTN, T2D, CKD.  He has been following a heart healthy diet, drinking "as much water as I can" and denies CP/dyspnea/palpitations/episodes of hypoglycemia.  He was seen by cards and amlodipine dosage was increased back to 106m.  He reports home readings SBP 130-140s, DBP 80-90s.  He denies tobacco/EOTH use. [facility] has not seen Nephrology in >year.    03/04/17 OV: Stanley Cook here for CPE.  He reports medication compliance and denies SE.  He has not been checking his blood sugar or BP at home. He denies episodes of hypoglycemia. He has been trying to increase vegetables/lean protein, however he has been consuming fast food on weekends due to travel to visit his mother who is in pulmonary rehab, re: COPD.  He has only been able to hit the gym 1-2 times week- cardio 20-390ms with some light wt training.  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 He estimates to drink at least 70 oz water/day He continues to abstain from toThurmontephrology referral placed 12/04/16 to address CKD-  Healthcare maintenance: Colonoscopy- due 2020  04/08/17 OV: Stanley Cook here for f/u: HTN Bp well above goal in November and started on Furosemide 2052mnce daily. Nephrology referral placed 12/04/16 and he finally has an appt 04/29/17- please KEEP it. Home BP readings- SBP 130-150, DBP 80-90s Due to work and family issues (mother recently placed put of town nursing home) he has been only able to go to gym once a week and he has been eating a lot of fast food. He reports taking all medication as directed and denies SE. He denies CP/dypspnea/palpitations/dizziness.  05/04/17 OV: Stanley Cook here for f/u: HTN He was seen by Nephrology last week and antihypertensives were adjusted: Furosemide  increased from 17m73m 80mg37mly Ramipril increased from 2.5mg B43mto 5 mg BID He has been increasing water intake and trying to follow heart healthy diet. 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 We reviewed most recent BMP results and he reports Nephrology obtained labs/urine specimen last week- will request results.  11/02/17 OV: Stanley Cook Teeplere for regular f/u: Last GFR 32 June 2019   Patient Care Team    Relationship Specialty Notifications Start End  DanforEsaw GrandchildCP - General Family Medicine  08/19/16     Patient Active Problem List   Diagnosis Date Noted  . Foot pain, right 08/11/2017  . Acute right ankle pain 08/11/2017  . Vitamin D deficiency 05/04/2017  . Encounter for routine adult physical exam with abnormal findings 03/04/2017  . CKD (chronic kidney disease) 12/04/2016  . H/O hypertensive crisis 09/30/2016  . Hyperlipidemia 09/30/2016  . Type 2 diabetes mellitus without complication, without long-term current use of insulin ([facility]) 0Painter9/2018  . Health care maintenance 08/19/2016  . Hypertension 08/19/2016  . Obesity, diabetes, and hypertension syndrome ([facility]) 0Milo8/2018     Past Medical History:  Diagnosis Date  . Diabetes mellitus type II, non insulin dependent ([facility])  CrellinHyperlipidemia   . Hypertension      History reviewed. No pertinent surgical history.   Family History  Problem Relation Age of  Onset  . Unexplained death Father 71       Natural causes, no autopsy  . Diabetes Sister      Social History   Substance and Sexual Activity  Drug Use No     Social History   Substance and Sexual Activity  Alcohol Use No     Social History   Tobacco Use  Smoking Status Never Smoker  Smokeless Tobacco Never Used     Outpatient Encounter Medications as of 11/02/2017  Medication Sig  . amLODipine (NORVASC) 10 MG tablet Take 1  tablet (10 mg total) by mouth daily.  Marland Kitchen aspirin EC 81 MG tablet Take by mouth.  Marland Kitchen atorvastatin (LIPITOR) 40 MG tablet Take 1 tablet (40 mg total) by mouth at bedtime.  . blood glucose meter kit and supplies KIT Dispense based on patient and insurance preference. Use up to four times daily as directed. (FOR ICD-9 250.00, 250.01).  . Calcium Carb-Cholecalciferol (CALCIUM 600 + D PO) Take by mouth.  . colchicine 0.6 MG tablet Take two tablets immediately, then wait an hr and take one tablet- max daily dose 1.61m  . furosemide (LASIX) 80 MG tablet Take 1 tablet by mouth daily.  . [DISCONTINUED] metFORMIN (GLUCOPHAGE) 500 MG tablet 1/2 tab nightly  . Insulin Pen Needle (PEN NEEDLES) 31G X 8 MM MISC 1 each by Does not apply route daily. Use one needle daily for injection of lirglutazide  . liraglutide (VICTOZA) 18 MG/3ML SOPN Inject 0.1 mLs (0.6 mg total) into the skin daily.  . ramipril (ALTACE) 10 MG capsule Take 1 capsule by mouth 2 (two) times daily.  . [DISCONTINUED] ramipril (ALTACE) 5 MG capsule Take 1 capsule by mouth daily.   No facility-administered encounter medications on file as of 11/02/2017.     Allergies: Penicillins  Body mass index is 37.28 kg/m.  Blood pressure (!) 146/80, pulse 89, height 5' 9.5" (1.765 m), weight 256 lb 1.6 oz (116.2 kg), SpO2 98 %.  Review of Systems  Constitutional: Positive for fatigue. Negative for activity change, appetite change, chills, diaphoresis, fever and unexpected weight change.  Eyes: Negative for visual disturbance.  Respiratory: Negative for cough, chest tightness, shortness of breath, wheezing and stridor.   Cardiovascular: Positive for leg swelling. Negative for chest pain and palpitations.  Gastrointestinal: Negative for abdominal distention, abdominal pain, blood in stool, constipation, diarrhea, nausea and vomiting.  Endocrine: Negative for cold intolerance, heat intolerance, polydipsia, polyphagia and polyuria.  Genitourinary:  Negative for difficulty urinating, flank pain and hematuria.  Musculoskeletal: Negative for arthralgias, back pain, gait problem, joint swelling, myalgias, neck pain and neck stiffness.  Skin: Negative for color change, pallor, rash and wound.  Neurological: Negative for dizziness, tremors, weakness and light-headedness.  Hematological: Does not bruise/bleed easily.  Psychiatric/Behavioral: Negative for dysphoric mood, self-injury, sleep disturbance and suicidal ideas. The patient is not nervous/anxious and is not hyperactive.        Objective:   Physical Exam  Constitutional: He is oriented to person, place, and time. He appears well-developed and well-nourished. No distress.  HENT:  Head: Normocephalic and atraumatic.  Right Ear: External ear normal. Tympanic membrane is not perforated and not bulging. Decreased hearing is noted.  Left Ear: External ear normal. Tympanic membrane is not perforated and not bulging. Decreased hearing is noted.  Bil hearing aids Excessive cerumen noted in both canals  Eyes: Pupils are equal, round, and reactive to light. Conjunctivae are normal.  Neck: Normal range of motion. Neck supple.  Cardiovascular: Normal  rate, regular rhythm, normal heart sounds and intact distal pulses.  No murmur heard. Pulmonary/Chest: Effort normal and breath sounds normal. No respiratory distress. He has no wheezes. He has no rales. He exhibits no tenderness.  Abdominal: Soft. Bowel sounds are normal. He exhibits no distension and no mass. There is no tenderness. There is no rebound, no guarding and no CVA tenderness.  Protuberant abdomen  Genitourinary: Rectal exam shows guaiac negative stool.  Musculoskeletal: He exhibits edema.       Right ankle: He exhibits swelling.       Left ankle: He exhibits swelling.       Right lower leg: He exhibits swelling.       Left lower leg: He exhibits swelling.       Right foot: There is swelling.       Left foot: There is swelling.   Lymphadenopathy:    He has no cervical adenopathy.  Neurological: He is alert and oriented to person, place, and time.  Skin: Skin is warm and dry. No rash noted. He is not diaphoretic. No erythema. There is pallor.  Psychiatric: He has a normal mood and affect. His behavior is normal. Judgment and thought content normal.  Nursing note and vitals reviewed.     Assessment & Plan:   1. Type 2 diabetes mellitus without complication, without long-term current use of insulin ([place])   2. Hyperlipidemia, unspecified hyperlipidemia type   3. Essential hypertension   4. Obesity (BMI 35.0-39.9 without comorbidity)   5. Chronic kidney disease, unspecified CKD stage   6. Health care maintenance     Type 2 diabetes mellitus without complication, without long-term current use of insulin ([facility]) Lab Results  Component Value Date   HGBA1C 6.5 (A) 11/02/2017   HGBA1C 5.6 03/04/2017   HGBA1C 5.9 12/04/2016   Last GFR 32 Metformin stopped Started on Liraglutide 0.9m subq daily injection Diabetic diet, regular exercise Check AM BS daily and afternoon BS several times/week Bring log to f/u in 4 qweks Microalbumin-normal  CKD (chronic kidney disease) 10/07/17- GFR 32 Metformin stopped Started on Liraglutide 0.634msubq daily injection Nephrologist increased Ramipril to 104mID Nephrologist  f/u Dec 2019  Obesity, diabetes, and hypertension syndrome ([place]ksurrent wt 256 Body mass index is 37.28 kg/m.  Started on Liraglutide 0.6mg63mily subq injection Continue regular exercise and follow diabetic diet  Hyperlipidemia Lipid panel checked today Currently taking atorvastatin 40mg55m Health care maintenance 4 week f/u Review blood sugar log, wt check, CMP check    FOLLOW-UP:  Return in about 1 month (around 11/30/2017) for Regular Follow Up, HTN, Diabetes, Obesity.

## 2017-11-02 ENCOUNTER — Ambulatory Visit: Payer: BC Managed Care – PPO | Admitting: Adult Health

## 2017-11-02 ENCOUNTER — Encounter: Payer: Self-pay | Admitting: Adult Health

## 2017-11-02 VITALS — BP 146/80 | HR 89 | Ht 69.5 in | Wt 256.1 lb

## 2017-11-02 DIAGNOSIS — N189 Chronic kidney disease, unspecified: Secondary | ICD-10-CM

## 2017-11-02 DIAGNOSIS — E785 Hyperlipidemia, unspecified: Secondary | ICD-10-CM

## 2017-11-02 DIAGNOSIS — Z0000 Encounter for general adult medical examination without abnormal findings: Secondary | ICD-10-CM

## 2017-11-02 DIAGNOSIS — I10 Essential (primary) hypertension: Secondary | ICD-10-CM | POA: Diagnosis not present

## 2017-11-02 DIAGNOSIS — E119 Type 2 diabetes mellitus without complications: Secondary | ICD-10-CM | POA: Diagnosis not present

## 2017-11-02 DIAGNOSIS — E669 Obesity, unspecified: Secondary | ICD-10-CM

## 2017-11-02 LAB — POCT UA - MICROALBUMIN
Albumin/Creatinine Ratio, Urine, POC: <30
Creatinine, POC: 50 mg/dL
MICROALBUMIN (UR) POC: 10 mg/L

## 2017-11-02 LAB — POCT GLYCOSYLATED HEMOGLOBIN (HGB A1C): Hemoglobin A1C: 6.5 % — AB (ref 4.0–5.6)

## 2017-11-02 MED ORDER — LIRAGLUTIDE 18 MG/3ML ~~LOC~~ SOPN: 0.6000 mg | PEN_INJECTOR | Freq: Every day | SUBCUTANEOUS | 3 refills | Status: DC

## 2017-11-02 MED ORDER — PEN NEEDLES 31G X 8 MM MISC: 1.0000 | Freq: Every day | 0 refills | Status: DC

## 2017-11-02 NOTE — Assessment & Plan Note (Signed)
4 week f/u Review blood sugar log, wt check, CMP check

## 2017-11-02 NOTE — Assessment & Plan Note (Addendum)
10/07/17- GFR 32 Metformin stopped Started on Liraglutide 0.6mg  subq daily injection Nephrologist increased Ramipril to 10mg  BID Nephrologist  f/u Dec 2019

## 2017-11-02 NOTE — Assessment & Plan Note (Signed)
>>  ASSESSMENT AND PLAN FOR DYSLIPIDEMIA WRITTEN ON 11/02/2017 10:06 AM BY DANFORD, KATY D, NP  Lipid panel checked today Currently taking atorvastatin 40mg  QD

## 2017-11-02 NOTE — Patient Instructions (Signed)
Diabetes Mellitus and Nutrition When you have diabetes (diabetes mellitus), it is very important to have healthy eating habits because your blood sugar (glucose) levels are greatly affected by what you eat and drink. Eating healthy foods in the appropriate amounts, at about the same times every day, can help you:  Control your blood glucose.  Lower your risk of heart disease.  Improve your blood pressure.  Reach or maintain a healthy weight.  Every person with diabetes is different, and each person has different needs for a meal plan. Your health care provider may recommend that you work with a diet and nutrition specialist (dietitian) to make a meal plan that is best for you. Your meal plan may vary depending on factors such as:  The calories you need.  The medicines you take.  Your weight.  Your blood glucose, blood pressure, and cholesterol levels.  Your activity level.  Other health conditions you have, such as heart or kidney disease.  How do carbohydrates affect me? Carbohydrates affect your blood glucose level more than any other type of food. Eating carbohydrates naturally increases the amount of glucose in your blood. Carbohydrate counting is a method for keeping track of how many carbohydrates you eat. Counting carbohydrates is important to keep your blood glucose at a healthy level, especially if you use insulin or take certain oral diabetes medicines. It is important to know how many carbohydrates you can safely have in each meal. This is different for every person. Your dietitian can help you calculate how many carbohydrates you should have at each meal and for snack. Foods that contain carbohydrates include:  Bread, cereal, rice, pasta, and crackers.  Potatoes and corn.  Peas, beans, and lentils.  Milk and yogurt.  Fruit and juice.  Desserts, such as cakes, cookies, ice cream, and candy.  How does alcohol affect me? Alcohol can cause a sudden decrease in blood  glucose (hypoglycemia), especially if you use insulin or take certain oral diabetes medicines. Hypoglycemia can be a life-threatening condition. Symptoms of hypoglycemia (sleepiness, dizziness, and confusion) are similar to symptoms of having too much alcohol. If your health care provider says that alcohol is safe for you, follow these guidelines:  Limit alcohol intake to no more than 1 drink per day for nonpregnant women and 2 drinks per day for men. One drink equals 12 oz of beer, 5 oz of wine, or 1 oz of hard liquor.  Do not drink on an empty stomach.  Keep yourself hydrated with water, diet soda, or unsweetened iced tea.  Keep in mind that regular soda, juice, and other mixers may contain a lot of sugar and must be counted as carbohydrates.  What are tips for following this plan? Reading food labels  Start by checking the serving size on the label. The amount of calories, carbohydrates, fats, and other nutrients listed on the label are based on one serving of the food. Many foods contain more than one serving per package.  Check the total grams (g) of carbohydrates in one serving. You can calculate the number of servings of carbohydrates in one serving by dividing the total carbohydrates by 15. For example, if a food has 30 g of total carbohydrates, it would be equal to 2 servings of carbohydrates.  Check the number of grams (g) of saturated and trans fats in one serving. Choose foods that have low or no amount of these fats.  Check the number of milligrams (mg) of sodium in one serving. Most people   should limit total sodium intake to less than 2,300 mg per day.  Always check the nutrition information of foods labeled as "low-fat" or "nonfat". These foods may be higher in added sugar or refined carbohydrates and should be avoided.  Talk to your dietitian to identify your daily goals for nutrients listed on the label. Shopping  Avoid buying canned, premade, or processed foods. These  foods tend to be high in fat, sodium, and added sugar.  Shop around the outside edge of the grocery store. This includes fresh fruits and vegetables, bulk grains, fresh meats, and fresh dairy. Cooking  Use low-heat cooking methods, such as baking, instead of high-heat cooking methods like deep frying.  Cook using healthy oils, such as olive, canola, or sunflower oil.  Avoid cooking with butter, cream, or high-fat meats. Meal planning  Eat meals and snacks regularly, preferably at the same times every day. Avoid going long periods of time without eating.  Eat foods high in fiber, such as fresh fruits, vegetables, beans, and whole grains. Talk to your dietitian about how many servings of carbohydrates you can eat at each meal.  Eat 4-6 ounces of lean protein each day, such as lean meat, chicken, fish, eggs, or tofu. 1 ounce is equal to 1 ounce of meat, chicken, or fish, 1 egg, or 1/4 cup of tofu.  Eat some foods each day that contain healthy fats, such as avocado, nuts, seeds, and fish. Lifestyle   Check your blood glucose regularly.  Exercise at least 30 minutes 5 or more days each week, or as told by your health care provider.  Take medicines as told by your health care provider.  Do not use any products that contain nicotine or tobacco, such as cigarettes and e-cigarettes. If you need help quitting, ask your health care provider.  Work with a Veterinary surgeon or diabetes educator to identify strategies to manage stress and any emotional and social challenges. What are some questions to ask my health care provider?  Do I need to meet with a diabetes educator?  Do I need to meet with a dietitian?  What number can I call if I have questions?  When are the best times to check my blood glucose? Where to find more information:  American Diabetes Association: diabetes.org/food-and-fitness/food  Academy of Nutrition and Dietetics:  https://www.vargas.com/  [employer] of Diabetes and Digestive and Kidney Diseases (NIH): FindJewelers.cz Summary  A healthy meal plan will help you control your blood glucose and maintain a healthy lifestyle.  Working with a diet and nutrition specialist (dietitian) can help you make a meal plan that is best for you.  Keep in mind that carbohydrates and alcohol have immediate effects on your blood glucose levels. It is important to count carbohydrates and to use alcohol carefully. This information is not intended to replace advice given to you by your health care provider. Make sure you discuss any questions you have with your health care provider. Document Released: 12/26/2004 Document Revised: 05/05/2016 Document Reviewed: 05/05/2016 Elsevier Interactive Patient Education  2018 [employer].  Liraglutide injection What is this medicine? LIRAGLUTIDE (LIR a GLOO tide) is used to improve blood sugar control in adults with type 2 diabetes. This medicine may be used with other diabetes medicines. This drug may also reduce the risk of heart attack or stroke if you have type 2 diabetes and risk factors for heart disease. This medicine may be used for other purposes; ask your health care provider or pharmacist if you have questions.  COMMON BRAND NAME(S): Victoza What should I tell my health care provider before I take this medicine? They need to know if you have any of these conditions: -endocrine tumors (MEN 2) or if someone in your family had these tumors -gallbladder disease -high cholesterol -history of alcohol abuse problem -history of pancreatitis -kidney disease or if you are on dialysis -liver disease -previous swelling of the tongue, face, or lips with difficulty breathing, difficulty swallowing, hoarseness, or tightening of the throat -stomach problems -thyroid  cancer or if someone in your family had thyroid cancer -an unusual or allergic reaction to liraglutide, other medicines, foods, dyes, or preservatives -pregnant or trying to get pregnant -breast-feeding How should I use this medicine? This medicine is for injection under the skin of your upper leg, stomach area, or upper arm. You will be taught how to prepare and give this medicine. Use exactly as directed. Take your medicine at regular intervals. Do not take it more often than directed. It is important that you put your used needles and syringes in a special sharps container. Do not put them in a trash can. If you do not have a sharps container, call your pharmacist or healthcare provider to get one. A special MedGuide will be given to you by the pharmacist with each prescription and refill. Be sure to read this information carefully each time. Talk to your pediatrician regarding the use of this medicine in children. Special care may be needed. Overdosage: If you think you have taken too much of this medicine contact a poison control center or emergency room at once. NOTE: This medicine is only for you. Do not share this medicine with others. What if I miss a dose? If you miss a dose, take it as soon as you can. If it is almost time for your next dose, take only that dose. Do not take double or extra doses. What may interact with this medicine? -other medicines for diabetes Many medications may cause changes in blood sugar, these include: -alcohol containing beverages -antiviral medicines for HIV or AIDS -aspirin and aspirin-like drugs -certain medicines for blood pressure, heart disease, irregular heart beat -chromium -diuretics -male hormones, such as estrogens or progestins, birth control pills -fenofibrate -gemfibrozil -isoniazid -lanreotide -male hormones or anabolic steroids -MAOIs like Carbex, Eldepryl, Marplan, Nardil, and Parnate -medicines for weight loss -medicines for  allergies, asthma, cold, or cough -medicines for depression, anxiety, or psychotic disturbances -niacin -nicotine -NSAIDs, medicines for pain and inflammation, like ibuprofen or naproxen -octreotide -pasireotide -pentamidine -phenytoin -probenecid -quinolone antibiotics such as ciprofloxacin, levofloxacin, ofloxacin -some herbal dietary supplements -steroid medicines such as prednisone or cortisone -sulfamethoxazole; trimethoprim -thyroid hormones Some medications can hide the warning symptoms of low blood sugar (hypoglycemia). You may need to monitor your blood sugar more closely if you are taking one of these medications. These include: -beta-blockers, often used for high blood pressure or heart problems (examples include atenolol, metoprolol, propranolol) -clonidine -guanethidine -reserpine This list may not describe all possible interactions. Give your health care provider a list of all the medicines, herbs, non-prescription drugs, or dietary supplements you use. Also tell them if you smoke, drink alcohol, or use illegal drugs. Some items may interact with your medicine. What should I watch for while using this medicine? Visit your doctor or health care professional for regular checks on your progress. Drink plenty of fluids while taking this medicine. Check with your doctor or health care professional if you get an attack of severe diarrhea, nausea, and vomiting.  The loss of too much body fluid can make it dangerous for you to take this medicine. A test called the HbA1C (A1C) will be monitored. This is a simple blood test. It measures your blood sugar control over the last 2 to 3 months. You will receive this test every 3 to 6 months. Learn how to check your blood sugar. Learn the symptoms of low and high blood sugar and how to manage them. Always carry a quick-source of sugar with you in case you have symptoms of low blood sugar. Examples include hard sugar candy or glucose tablets.  Make sure others know that you can choke if you eat or drink when you develop serious symptoms of low blood sugar, such as seizures or unconsciousness. They must get medical help at once. Tell your doctor or health care professional if you have high blood sugar. You might need to change the dose of your medicine. If you are sick or exercising more than usual, you might need to change the dose of your medicine. Do not skip meals. Ask your doctor or health care professional if you should avoid alcohol. Many nonprescription cough and cold products contain sugar or alcohol. These can affect blood sugar. Pens should never be shared. Even if the needle is changed, sharing may result in passing of viruses like hepatitis or HIV. Wear a medical ID bracelet or chain, and carry a card that describes your disease and details of your medicine and dosage times. What side effects may I notice from receiving this medicine? Side effects that you should report to your doctor or health care professional as soon as possible: -allergic reactions like skin rash, itching or hives, swelling of the face, lips, or tongue -breathing problems -diarrhea that continues or is severe -lump or swelling on the neck -severe nausea -signs and symptoms of infection like fever or chills; cough; sore throat; pain or trouble passing urine -signs and symptoms of low blood sugar such as feeling anxious, confusion, dizziness, increased hunger, unusually weak or tired, sweating, shakiness, cold, irritable, headache, blurred vision, fast heartbeat, loss of consciousness -signs and symptoms of kidney injury like trouble passing urine or change in the amount of urine -trouble swallowing -unusual stomach upset or pain -vomiting Side effects that usually do not require medical attention (report to your doctor or health care professional if they continue or are bothersome): -constipation -decreased  appetite -diarrhea -fatigue -headache -nausea -pain, redness, or irritation at site where injected -stomach upset -stuffy or runny nose This list may not describe all possible side effects. Call your doctor for medical advice about side effects. You may report side effects to FDA at 1-800-FDA-1088. Where should I keep my medicine? Keep out of the reach of children. Store unopened pen in a refrigerator between 2 and 8 degrees C (36 and 46 degrees F). Do not freeze or use if the medicine has been frozen. Protect from light and excessive heat. After you first use the pen, it can be stored at room temperature between 15 and 30 degrees C (59 and 86 degrees F) or in a refrigerator. Throw away your used pen after 30 days or after the expiration date, whichever comes first. Do not store your pen with the needle attached. If the needle is left on, medicine may leak from the pen. NOTE: This sheet is a summary. It may not cover all possible information. If you have questions about this medicine, talk to your doctor, pharmacist, or health care provider.  2018  Elsevier/Gold Standard (2016-04-17 14:39:40)   Stop Metformin Start once daily Liraglutide 0.6mg  subcutaneous injection. Please record AM BS daily and afternoon BS several times/week Continue to remain well hydrated and follow diabetic diet. Continue regular exercise. Return in 4 weeks, please bring blood sugar log. We will call you when lab results are available. Enjoy the rest of your summer! NICE TO SEE YOU!

## 2017-11-02 NOTE — Assessment & Plan Note (Addendum)
Lipid panel checked today Currently taking atorvastatin 40mg  QD

## 2017-11-02 NOTE — Assessment & Plan Note (Addendum)
Lab Results  Component Value Date   HGBA1C 6.5 (A) 11/02/2017   HGBA1C 5.6 03/04/2017   HGBA1C 5.9 12/04/2016   Last GFR 32 Metformin stopped Started on Liraglutide 0.6mg  subq daily injection Diabetic diet, regular exercise Check AM BS daily and afternoon BS several times/week Bring log to f/u in 4 qweks Microalbumin-normal

## 2017-11-02 NOTE — Assessment & Plan Note (Signed)
>>  ASSESSMENT AND PLAN FOR DIABETES MELLITUS WRITTEN ON 11/02/2017 10:03 AM BY DANFORD, Valetta Fuller D, NP  Lab Results  Component Value Date   HGBA1C 6.5 (A) 11/02/2017   HGBA1C 5.6 03/04/2017   HGBA1C 5.9 12/04/2016   Last GFR 32 Metformin stopped Started on Liraglutide 0.6mg  subq daily injection Diabetic diet, regular exercise Check AM BS daily and afternoon BS several times/week Bring log to f/u in 4 qweks Microalbumin-normal

## 2017-11-02 NOTE — Assessment & Plan Note (Signed)
Current wt 256 Body mass index is 37.28 kg/m.  Started on Liraglutide 0.6mg  daily subq injection Continue regular exercise and follow diabetic diet

## 2017-11-03 LAB — LIPID PANEL
CHOLESTEROL TOTAL: 129 mg/dL (ref 100–199)
Chol/HDL Ratio: 3.6 ratio (ref 0.0–5.0)
HDL: 36 mg/dL — ABNORMAL LOW (ref >39)
LDL CALC: 63 mg/dL (ref 0–99)
Triglycerides: 152 mg/dL — ABNORMAL HIGH (ref 0–149)
VLDL Cholesterol Cal: 30 mg/dL (ref 5–40)

## 2017-11-17 NOTE — Progress Notes (Signed)
Received Epic notification that pt has not read MyChart message regarding results.    This message is to inform you that the patient has not yet read the following message. (Notification date: November 17, 2017)  Lab test results   From Stan HeadNelson, Zaron Zwiefelhofer S, CMA To Stanley Cook Sent 11/03/2017 9:13 AM  Dear Mr. Stanley Cook,   Katy asked that I inform you that your total cholesterol was good at 129, triglycerides were slightly above goal at 152, and your LDL (bad cholesterol) is at goal at 63. Also, please continue with your current statin therapy, eating a heart healthy diet, and regular exercise.   Wishing you well,  Archie Pattenonya, CMA for  William HamburgerKaty Danford, NP   [address]   MyChart User Last Read On  Stanley Cook Not Read      Letter mailed to pt with results.  Tiajuana Amass. Angelicia Lessner, CMA

## 2017-11-22 NOTE — Progress Notes (Signed)
Subjective:    Patient ID: Stanley Cook, male    DOB: [birth year], 57 y.o.   MRN: [id number]  HPI: 12/04/16 OV:  Stanley Cook is here for f/u: HTN, T2D, CKD.  He has been following a heart healthy diet, drinking "as much water as I can" and denies CP/dyspnea/palpitations/episodes of hypoglycemia.  He was seen by cards and amlodipine dosage was increased back to 38m.  He reports home readings SBP 130-140s, DBP 80-90s.  He denies tobacco/EOTH use. He [facility] however has not seen Nephrology in >year.    03/04/17 OV: Stanley Cook here for CPE.  He reports medication compliance and denies SE.  He has not been checking his blood sugar or BP at home. He denies episodes of hypoglycemia. He has been trying to increase vegetables/lean protein, however he has been consuming fast food on weekends due to travel to visit his mother who is in pulmonary rehab, re: COPD.  He has only been able to hit the gym 1-2 times week- cardio 20-344ms with some light wt training.  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 He estimates to drink at least 70 oz water/day [facility] to address CKD-  Healthcare maintenance: Colonoscopy- due 2020  04/08/17 OV: Stanley Cook here for f/u: HTN Bp well above goal in November and started on Furosemide 2067mnce daily. Nephrology referral placed 12/04/16 and he finally has an appt 04/29/17- please KEEP it. Home BP readings- SBP 130-150, DBP 80-90s Due to work and family issues (mother recently placed put of town nursing home) he has been only able to go to gym once a week and he has been eating a lot of fast food. He reports taking all medication as directed and denies SE. He denies CP/dypspnea/palpitations/dizziness.  05/04/17 OV: Stanley Cook here for f/u: HTN He was seen by Nephrology last week and antihypertensives were adjusted: Furosemide  increased from 39m73m 80mg83mly Ramipril increased from 2.5mg B57mto 5 mg BID He has been increasing water intake and trying to follow heart healthy diet. 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 We reviewed most recent BMP results and he reports Nephrology obtained labs/urine specimen last week- will request results.  11/02/17 OV: Stanley Cook for regular f/u: Last GFR 32 June 2019  11/23/17 OV: Stanley Cook for CMP re-check, Wt check, and see how he is tolerating once daily liraglutide injection 0.6 mg  He denies GI upset or episodes of hypoglycemia He has increased regular exercise - cardio and wt training to at least 3 days/week He has also been golfing as much as possible He has not been checking blood sugar at home His next Nephrology appt in Dec 2019 He has lost >4 lbs since last OV 3 weeks ago, current wt 253  Patient Care Team    Relationship Specialty Notifications Start End  [facility] - General Family Medicine  08/19/16     Patient Active Problem List   Diagnosis Date Noted  . Foot pain, right 08/11/2017  . Acute right ankle pain 08/11/2017  . Vitamin D deficiency 05/04/2017  . Encounter for routine adult physical exam with abnormal findings 03/04/2017  . CKD (chronic kidney disease) 12/04/2016  . H/O hypertensive crisis 09/30/2016  .  Hyperlipidemia 09/30/2016  . Type 2 diabetes mellitus without complication, without long-term current use of insulin ([place]) 09/30/2016  . Health care maintenance 08/19/2016  . Hypertension 08/19/2016  . Obesity, diabetes, and hypertension syndrome ([place]) 08/19/2016     Past Medical History:  Diagnosis Date  . Diabetes mellitus type II, non insulin dependent ([place])   . Hyperlipidemia   . Hypertension      History reviewed. No pertinent surgical history.   Family History  Problem Relation Age of Onset   . Unexplained death Father 41       Natural causes, no autopsy  . Diabetes Sister      Social History   Substance and Sexual Activity  Drug Use No     Social History   Substance and Sexual Activity  Alcohol Use No     Social History   Tobacco Use  Smoking Status Never Smoker  Smokeless Tobacco Never Used     Outpatient Encounter Medications as of 11/23/2017  Medication Sig  . amLODipine (NORVASC) 10 MG tablet Take 1 tablet (10 mg total) by mouth daily.  Marland Kitchen aspirin EC 81 MG tablet Take by mouth.  Marland Kitchen atorvastatin (LIPITOR) 40 MG tablet Take 1 tablet (40 mg total) by mouth at bedtime.  . blood glucose meter kit and supplies KIT Dispense based on patient and insurance preference. Use up to four times daily as directed. (FOR ICD-9 250.00, 250.01).  . Calcium Carb-Cholecalciferol (CALCIUM 600 + D PO) Take by mouth.  . colchicine 0.6 MG tablet Take two tablets immediately, then wait an hr and take one tablet- max daily dose 1.55m  . furosemide (LASIX) 80 MG tablet Take 1 tablet by mouth daily.  . Insulin Pen Needle (PEN NEEDLES) 31G X 8 MM MISC 1 each by Does not apply route daily. Use one needle daily for injection of lirglutazide  . liraglutide (VICTOZA) 18 MG/3ML SOPN Inject 0.1 mLs (0.6 mg total) into the skin daily.  . ramipril (ALTACE) 10 MG capsule Take 1 capsule by mouth 2 (two) times daily.   No facility-administered encounter medications on file as of 11/23/2017.     Allergies: Penicillins  Body mass index is 36.91 kg/m.  Blood pressure (!) 143/81, pulse 72, height 5' 9.5" (1.765 m), weight 253 lb 9.6 oz (115 kg), SpO2 98 %.  Review of Systems  Constitutional: Positive for fatigue. Negative for activity change, appetite change, chills, diaphoresis, fever and unexpected weight change.  Eyes: Negative for visual disturbance.  Respiratory: Negative for cough, chest tightness, shortness of breath, wheezing and stridor.   Cardiovascular: Positive for leg swelling.  Negative for chest pain and palpitations.  Gastrointestinal: Negative for abdominal distention, abdominal pain, blood in stool, constipation, diarrhea, nausea and vomiting.  Endocrine: Negative for cold intolerance, heat intolerance, polydipsia, polyphagia and polyuria.  Genitourinary: Negative for difficulty urinating, flank pain and hematuria.  Musculoskeletal: Negative for arthralgias, back pain, gait problem, joint swelling, myalgias, neck pain and neck stiffness.  Skin: Negative for color change, pallor, rash and wound.  Neurological: Negative for dizziness, tremors, weakness and light-headedness.  Hematological: Does not bruise/bleed easily.  Psychiatric/Behavioral: Negative for dysphoric mood, self-injury, sleep disturbance and suicidal ideas. The patient is not nervous/anxious and is not hyperactive.        Objective:   Physical Exam  Constitutional: He is oriented to person, place, and time. He appears well-developed and well-nourished. No distress.  HENT:  Head: Normocephalic and atraumatic.  Right Ear: External ear normal. Tympanic membrane is not  perforated and not bulging. Decreased hearing is noted.  Left Ear: External ear normal. Tympanic membrane is not perforated and not bulging. Decreased hearing is noted.  Bil hearing aids   Eyes: Pupils are equal, round, and reactive to light. Conjunctivae are normal.  Neck: Normal range of motion. Neck supple.  Cardiovascular: Normal rate, regular rhythm, normal heart sounds and intact distal pulses.  No murmur heard. Pulmonary/Chest: Effort normal and breath sounds normal. No respiratory distress. He has no wheezes. He has no rales. He exhibits no tenderness.  Abdominal: There is no CVA tenderness.  Genitourinary: Rectal exam shows guaiac negative stool.  Musculoskeletal: He exhibits edema.       Right ankle: He exhibits swelling.       Left ankle: He exhibits swelling.       Right lower leg: He exhibits swelling.       Left  lower leg: He exhibits swelling.       Right foot: There is swelling.       Left foot: There is swelling.  Lymphadenopathy:    He has no cervical adenopathy.  Neurological: He is alert and oriented to person, place, and time.  Skin: Skin is warm and dry. No rash noted. He is not diaphoretic. No erythema. There is pallor.  Psychiatric: He has a normal mood and affect. His behavior is normal. Judgment and thought content normal.  Nursing note and vitals reviewed.     Assessment & Plan:   1. Hypertension, unspecified type   2. Type 2 diabetes mellitus without complication, without long-term current use of insulin ([place])   3. Chronic kidney disease, unspecified CKD stage   4. Health care maintenance     Hypertension BP 143/81 HR 72 Currently on amlodipine 71m QD, Ramipril 170mBID Has f/u with Nephrology Dec 2019  Type 2 diabetes mellitus without complication, without long-term current use of insulin ([facility]) Lab Results  Component Value Date   HGBA1C 6.5 (A) 11/02/2017   HGBA1C 5.6 03/04/2017   HGBA1C 5.9 12/04/2016   Due to low GFR unable to take Metformin Currently on Liraglutide 0.27m8mD Please call you insurance carrier and ask which glucometer is covered under your plan. Please call us [place] glucometer information and we will send rx. Please check your blood sugar each morning before you eat and in the afternoon.   Health care maintenance Continue all medications as directed. Please call you insurance carrier and ask which glucometer is covered under your plan. Please call us [place] glucometer information and we will send rx. Please check your blood sugar each morning before you eat and in the afternoon. We will call you when your labs results are available. Follow-up in 3 months. Great job on the regular exercise and weight loss!  CKD (chronic kidney disease) Followed by Nephrology CMP drawn today    FOLLOW-UP:  Return in about 3 months (around 02/23/2018) for  Regular Follow Up, HTN, Obesity, Diabetes.

## 2017-11-23 ENCOUNTER — Encounter: Payer: Self-pay | Admitting: Adult Health

## 2017-11-23 ENCOUNTER — Other Ambulatory Visit: Payer: Self-pay

## 2017-11-23 ENCOUNTER — Ambulatory Visit (INDEPENDENT_AMBULATORY_CARE_PROVIDER_SITE_OTHER): Payer: BC Managed Care – PPO | Admitting: Adult Health

## 2017-11-23 VITALS — BP 143/81 | HR 72 | Ht 69.5 in | Wt 253.6 lb

## 2017-11-23 DIAGNOSIS — I10 Essential (primary) hypertension: Secondary | ICD-10-CM | POA: Diagnosis not present

## 2017-11-23 DIAGNOSIS — N189 Chronic kidney disease, unspecified: Secondary | ICD-10-CM

## 2017-11-23 DIAGNOSIS — Z0000 Encounter for general adult medical examination without abnormal findings: Secondary | ICD-10-CM

## 2017-11-23 DIAGNOSIS — E119 Type 2 diabetes mellitus without complications: Secondary | ICD-10-CM | POA: Diagnosis not present

## 2017-11-23 MED ORDER — GLUCOSE BLOOD VI STRP: ORAL_STRIP | 12 refills | Status: DC

## 2017-11-23 NOTE — Assessment & Plan Note (Addendum)
BP 143/81 HR 72 Currently on amlodipine 10mg  QD, Ramipril 10mg  BID Has f/u with Nephrology Dec 2019

## 2017-11-23 NOTE — Assessment & Plan Note (Signed)
Continue all medications as directed. Please call you insurance carrier and ask which glucometer is covered under your plan. Please call us with glucometer information and we will send rx. Please check your blood sugar each morning before you eat and in the afternoon. We will call you when your labs results are available. Follow-up in 3 months. Great job on the regular exercise and weight loss!

## 2017-11-23 NOTE — Assessment & Plan Note (Signed)
>>  ASSESSMENT AND PLAN FOR DIABETES MELLITUS WRITTEN ON 11/23/2017  8:58 AM BY DANFORD, Valetta Fuller D, NP  Lab Results  Component Value Date   HGBA1C 6.5 (A) 11/02/2017   HGBA1C 5.6 03/04/2017   HGBA1C 5.9 12/04/2016   Due to low GFR unable to take Metformin Currently on Liraglutide 0.6mg  QD Please call you insurance carrier and ask which glucometer is covered under your plan. Please call us with glucometer information and we will send rx. Please check your blood sugar each morning before you eat and in the afternoon.

## 2017-11-23 NOTE — Patient Instructions (Addendum)
Diabetes Mellitus and Nutrition When you have diabetes (diabetes mellitus), it is very important to have healthy eating habits because your blood sugar (glucose) levels are greatly affected by what you eat and drink. Eating healthy foods in the appropriate amounts, at about the same times every day, can help you:  Control your blood glucose.  Lower your risk of heart disease.  Improve your blood pressure.  Reach or maintain a healthy weight.  Every person with diabetes is different, and each person has different needs for a meal plan. Your health care provider may recommend that you work with a diet and nutrition specialist (dietitian) to make a meal plan that is best for you. Your meal plan may vary depending on factors such as:  The calories you need.  The medicines you take.  Your weight.  Your blood glucose, blood pressure, and cholesterol levels.  Your activity level.  Other health conditions you have, such as heart or kidney disease.  How do carbohydrates affect me? Carbohydrates affect your blood glucose level more than any other type of food. Eating carbohydrates naturally increases the amount of glucose in your blood. Carbohydrate counting is a method for keeping track of how many carbohydrates you eat. Counting carbohydrates is important to keep your blood glucose at a healthy level, especially if you use insulin or take certain oral diabetes medicines. It is important to know how many carbohydrates you can safely have in each meal. This is different for every person. Your dietitian can help you calculate how many carbohydrates you should have at each meal and for snack. Foods that contain carbohydrates include:  Bread, cereal, rice, pasta, and crackers.  Potatoes and corn.  Peas, beans, and lentils.  Milk and yogurt.  Fruit and juice.  Desserts, such as cakes, cookies, ice cream, and candy.  How does alcohol affect me? Alcohol can cause a sudden decrease in blood  glucose (hypoglycemia), especially if you use insulin or take certain oral diabetes medicines. Hypoglycemia can be a life-threatening condition. Symptoms of hypoglycemia (sleepiness, dizziness, and confusion) are similar to symptoms of having too much alcohol. If your health care provider says that alcohol is safe for you, follow these guidelines:  Limit alcohol intake to no more than 1 drink per day for nonpregnant women and 2 drinks per day for men. One drink equals 12 oz of beer, 5 oz of wine, or 1 oz of hard liquor.  Do not drink on an empty stomach.  Keep yourself hydrated with water, diet soda, or unsweetened iced tea.  Keep in mind that regular soda, juice, and other mixers may contain a lot of sugar and must be counted as carbohydrates.  What are tips for following this plan? Reading food labels  Start by checking the serving size on the label. The amount of calories, carbohydrates, fats, and other nutrients listed on the label are based on one serving of the food. Many foods contain more than one serving per package.  Check the total grams (g) of carbohydrates in one serving. You can calculate the number of servings of carbohydrates in one serving by dividing the total carbohydrates by 15. For example, if a food has 30 g of total carbohydrates, it would be equal to 2 servings of carbohydrates.  Check the number of grams (g) of saturated and trans fats in one serving. Choose foods that have low or no amount of these fats.  Check the number of milligrams (mg) of sodium in one serving. Most people   should limit total sodium intake to less than 2,300 mg per day.  Always check the nutrition information of foods labeled as "low-fat" or "nonfat". These foods may be higher in added sugar or refined carbohydrates and should be avoided.  Talk to your dietitian to identify your daily goals for nutrients listed on the label. Shopping  Avoid buying canned, premade, or processed foods. These  foods tend to be high in fat, sodium, and added sugar.  Shop around the outside edge of the grocery store. This includes fresh fruits and vegetables, bulk grains, fresh meats, and fresh dairy. Cooking  Use low-heat cooking methods, such as baking, instead of high-heat cooking methods like deep frying.  Cook using healthy oils, such as olive, canola, or sunflower oil.  Avoid cooking with butter, cream, or high-fat meats. Meal planning  Eat meals and snacks regularly, preferably at the same times every day. Avoid going long periods of time without eating.  Eat foods high in fiber, such as fresh fruits, vegetables, beans, and whole grains. Talk to your dietitian about how many servings of carbohydrates you can eat at each meal.  Eat 4-6 ounces of lean protein each day, such as lean meat, chicken, fish, eggs, or tofu. 1 ounce is equal to 1 ounce of meat, chicken, or fish, 1 egg, or 1/4 cup of tofu.  Eat some foods each day that contain healthy fats, such as avocado, nuts, seeds, and fish. Lifestyle   Check your blood glucose regularly.  Exercise at least 30 minutes 5 or more days each week, or as told by your health care provider.  Take medicines as told by your health care provider.  Do not use any products that contain nicotine or tobacco, such as cigarettes and e-cigarettes. If you need help quitting, ask your health care provider.  Work with a Veterinary surgeoncounselor or diabetes educator to identify strategies to manage stress and any emotional and social challenges. What are some questions to ask my health care provider?  Do I need to meet with a diabetes educator?  Do I need to meet with a dietitian?  What number can I call if I have questions?  When are the best times to check my blood glucose? Where to find more information:  American Diabetes Association: diabetes.org/food-and-fitness/food  Academy of Nutrition and Dietetics:  https://www.vargas.com/www.eatright.org/resources/health/diseases-and-conditions/diabetes  [employer] Institute of Diabetes and Digestive and Kidney Diseases (NIH): FindJewelers.czwww.niddk.nih.gov/health-information/diabetes/overview/diet-eating-physical-activity Summary  A healthy meal plan will help you control your blood glucose and maintain a healthy lifestyle.  Working with a diet and nutrition specialist (dietitian) can help you make a meal plan that is best for you.  Keep in mind that carbohydrates and alcohol have immediate effects on your blood glucose levels. It is important to count carbohydrates and to use alcohol carefully. This information is not intended to replace advice given to you by your health care provider. Make sure you discuss any questions you have with your health care provider. Document Released: 12/26/2004 Document Revised: 05/05/2016 Document Reviewed: 05/05/2016 Elsevier Interactive Patient Education  2018 [employer]  Continue all medications as directed. Please call you insurance carrier and ask which glucometer is covered under your plan. Please call us with glucometer information and we will send rx. Please check your blood sugar each morning before you eat and in the afternoon.  Blood Glucose Monitoring, Adult Monitoring your blood sugar (glucose) helps you manage your diabetes. It also helps you and your health care provider determine how well your diabetes management plan is  working. Blood glucose monitoring involves checking your blood glucose as often as directed, and keeping a record (log) of your results over time. Why should I monitor my blood glucose? Checking your blood glucose regularly can:  Help you understand how food, exercise, illnesses, and medicines affect your blood glucose.  Let you know what your blood glucose is at any time. You can quickly tell if you are having low blood glucose (hypoglycemia) or high blood glucose (hyperglycemia).  Help you and your health care  provider adjust your medicines as needed.  When should I check my blood glucose? Follow instructions from your health care provider about how often to check your blood glucose. This may depend on:  The type of diabetes you have.  How well-controlled your diabetes is.  Medicines you are taking.  If you have type 1 diabetes:  Check your blood glucose at least 2 times a day.  Also check your blood glucose: ? Before every insulin injection. ? Before and after exercise. ? Between meals. ? 2 hours after a meal. ? Occasionally between 2:00 a.m. and 3:00 a.m., as directed. ? Before potentially dangerous tasks, like driving or using heavy machinery. ? At bedtime.  You may need to check your blood glucose more often, up to 6-10 times a day: ? If you use an insulin pump. ? If you need multiple daily injections (MDI). ? If your diabetes is not well-controlled. ? If you are ill. ? If you have a history of severe hypoglycemia. ? If you have a history of not knowing when your blood glucose is getting low (hypoglycemia unawareness). If you have type 2 diabetes:  If you take insulin or other diabetes medicines, check your blood glucose at least 2 times a day.  If you are on intensive insulin therapy, check your blood glucose at least 4 times a day. Occasionally, you may also need to check between 2:00 a.m. and 3:00 a.m., as directed.  Also check your blood glucose: ? Before and after exercise. ? Before potentially dangerous tasks, like driving or using heavy machinery.  You may need to check your blood glucose more often if: ? Your medicine is being adjusted. ? Your diabetes is not well-controlled. ? You are ill. What is a blood glucose log?  A blood glucose log is a record of your blood glucose readings. It helps you and your health care provider: ? Look for patterns in your blood glucose over time. ? Adjust your diabetes management plan as needed.  Every time you check your blood  glucose, write down your result and notes about things that may be affecting your blood glucose, such as your diet and exercise for the day.  Most glucose meters store a record of glucose readings in the meter. Some meters allow you to download your records to a computer. How do I check my blood glucose? Follow these steps to get accurate readings of your blood glucose: Supplies needed   Blood glucose meter.  Test strips for your meter. Each meter has its own strips. You must use the strips that come with your meter.  A needle to prick your finger (lancet). Do not use lancets more than once.  A device that holds the lancet (lancing device).  A journal or log book to write down your results. Procedure  Wash your hands with soap and water.  Prick the side of your finger (not the tip) with the lancet. Use a different finger each time.  Gently rub the  finger until a small drop of blood appears.  Follow instructions that come with your meter for inserting the test strip, applying blood to the strip, and using your blood glucose meter.  Write down your result and any notes. Alternative testing sites  Some meters allow you to use areas of your body other than your finger (alternative sites) to test your blood.  If you think you may have hypoglycemia, or if you have hypoglycemia unawareness, do not use alternative sites. Use your finger instead.  Alternative sites may not be as accurate as the fingers, because blood flow is slower in these areas. This means that the result you get may be delayed, and it may be different from the result that you would get from your finger.  The most common alternative sites are: ? Forearm. ? Thigh. ? Palm of the hand. Additional tips  Always keep your supplies with you.  If you have questions or need help, all blood glucose meters have a 24-hour "hotline" number that you can call. You may also contact your health care provider.  After you use a  few boxes of test strips, adjust (calibrate) your blood glucose meter by following instructions that came with your meter. This information is not intended to replace advice given to you by your health care provider. Make sure you discuss any questions you have with your health care provider. Document Released: 04/03/2003 Document Revised: 10/19/2015 Document Reviewed: 09/10/2015 Elsevier Interactive Patient Education  2017 [employer].  Continue all medications as directed. Please call you insurance carrier and ask which glucometer is covered under your plan. Please call us with glucometer information and we will send rx. Please check your blood sugar each morning before you eat and in the afternoon. We will call you when your labs results are available. Follow-up in 3 months. Great job on the regular exercise and weight loss! NICE TO SEE YOU!

## 2017-11-23 NOTE — Assessment & Plan Note (Signed)
Lab Results  Component Value Date   HGBA1C 6.5 (A) 11/02/2017   HGBA1C 5.6 03/04/2017   HGBA1C 5.9 12/04/2016   Due to low GFR unable to take Metformin Currently on Liraglutide 0.6mg  QD Please call you insurance carrier and ask which glucometer is covered under your plan. Please call us with glucometer information and we will send rx. Please check your blood sugar each morning before you eat and in the afternoon.

## 2017-11-23 NOTE — Assessment & Plan Note (Signed)
Followed by Nephrology CMP drawn today

## 2017-11-24 LAB — COMPREHENSIVE METABOLIC PANEL
ALBUMIN: 4.4 g/dL (ref 3.5–5.5)
ALT: 14 IU/L (ref 0–44)
AST: 21 IU/L (ref 0–40)
Albumin/Globulin Ratio: 1.8 (ref 1.2–2.2)
Alkaline Phosphatase: 90 IU/L (ref 39–117)
BUN / CREAT RATIO: 14 (ref 9–20)
BUN: 30 mg/dL — AB (ref 6–24)
Bilirubin Total: 0.5 mg/dL (ref 0.0–1.2)
CALCIUM: 9.4 mg/dL (ref 8.7–10.2)
CO2: 24 mmol/L (ref 20–29)
Chloride: 96 mmol/L (ref 96–106)
Creatinine, Ser: 2.16 mg/dL — ABNORMAL HIGH (ref 0.76–1.27)
GFR calc Af Amer: 39 mL/min/{1.73_m2} — ABNORMAL LOW (ref >59)
GFR, EST NON AFRICAN AMERICAN: 33 mL/min/{1.73_m2} — AB (ref >59)
GLOBULIN, TOTAL: 2.4 g/dL (ref 1.5–4.5)
GLUCOSE: 123 mg/dL — AB (ref 65–99)
Potassium: 4.2 mmol/L (ref 3.5–5.2)
SODIUM: 136 mmol/L (ref 134–144)
TOTAL PROTEIN: 6.8 g/dL (ref 6.0–8.5)

## 2017-12-20 ENCOUNTER — Other Ambulatory Visit: Payer: Self-pay | Admitting: Physician Assistant

## 2017-12-21 NOTE — Telephone Encounter (Signed)
Rx sent to pharmacy   

## 2017-12-24 ENCOUNTER — Other Ambulatory Visit: Payer: Self-pay | Admitting: Adult Health

## 2017-12-24 ENCOUNTER — Telehealth: Payer: Self-pay | Admitting: Adult Health

## 2018-02-01 ENCOUNTER — Telehealth: Payer: Self-pay | Admitting: Adult Health

## 2018-02-01 MED ORDER — LIRAGLUTIDE 18 MG/3ML ~~LOC~~ SOPN: 0.6000 mg | PEN_INJECTOR | Freq: Every day | SUBCUTANEOUS | 3 refills | Status: DC

## 2018-02-01 NOTE — Telephone Encounter (Signed)
Patient came into office wondering if he was to continue on Victoza med. He is running out and before he requested a refill wants to make sure this is a med he will cont to be on. Please advise and if we are continuing med please send refill to [facility] on [address]. Patient can be reached on mobile and if he does not pick up asks for clinic staff to leave a VM.

## 2018-02-01 NOTE — Addendum Note (Signed)
Addended by: Stan Head on: 02/01/2018 04:42 PM   Modules accepted: Orders

## 2018-02-02 ENCOUNTER — Other Ambulatory Visit: Payer: Self-pay | Admitting: Adult Health

## 2018-02-02 DIAGNOSIS — E119 Type 2 diabetes mellitus without complications: Secondary | ICD-10-CM

## 2018-03-02 NOTE — Progress Notes (Signed)
Subjective:    Patient ID: Stanley Cook, male    DOB: May 09, 1960, 57 y.o.   MRN: [id number]  HPI: 12/04/16 OV:  Stanley Cook is here for f/u: HTN, T2D, CKD.  He has been following a heart healthy diet, drinking "as much water as I can" and denies CP/dyspnea/palpitations/episodes of hypoglycemia.  He was seen by cards and amlodipine dosage was increased back to 19m.  He reports home readings SBP 130-140s, DBP 80-90s.  He denies tobacco/EOTH use. He thinks [facility] however has not seen Nephrology in >year.    03/04/17 OV: Stanley Cook here for CPE.  He reports medication compliance and denies SE.  He has not been checking his blood sugar or BP at home. He denies episodes of hypoglycemia. He has been trying to increase vegetables/lean protein, however he has been consuming fast food on weekends due to travel to visit his mother who is in pulmonary rehab, re: COPD.  He has only been able to hit the gym 1-2 times week- cardio 20-337ms with some light wt training.  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 He estimates to drink at least 70 oz water/day He continues to abstain from toMacyephrology referral placed 12/04/16 to address CKD-  Healthcare maintenance: Colonoscopy- due 2020  04/08/17 OV: Stanley Cook here for f/u: HTN Bp well above goal in November and started on Furosemide 2071mnce daily. Nephrology referral placed 12/04/16 and he finally has an appt 04/29/17- please KEEP it. Home BP readings- SBP 130-150, DBP 80-90s Due to work and family issues (mother recently placed put of town nursing home) he has been only able to go to gym once a week and he has been eating a lot of fast food. He reports taking all medication as directed and denies SE. He denies CP/dypspnea/palpitations/dizziness.  05/04/17 OV: Stanley Cook here for f/u: HTN He was seen by Nephrology last week and antihypertensives were adjusted: Furosemide  increased from 33m76m 80mg64mly Ramipril increased from 2.5mg B17mto 5 mg BID He has been increasing water intake and trying to follow heart healthy diet. 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 We reviewed most recent BMP results and he reports Nephrology obtained labs/urine specimen last week- will request results.  11/02/17 OV: Stanley Cook for regular f/u: Last GFR 32 June 2019  11/23/17 OV: Mr. Stanley Tibbittsre for CMP re-check, Wt check, and see how he is tolerating once daily liraglutide injection 0.6 mg  He denies GI upset or episodes of hypoglycemia He has increased regular exercise - cardio and wt training to at least 3 days/week He has also been golfing as much as possible He has not been checking blood sugar at home His next Nephrology appt in Dec 2019 He has lost >4 lbs since last OV 3 weeks ago, current wt 253  03/04/18 OV: Mr. 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- cardio and weights Reviewed recent CMP- CMP Latest Ref Rng & Units 11/23/2017 10/07/2017 07/02/2017  Glucose 65 -  99 mg/dL 123(H) - -  BUN 6 - 24 mg/dL 30(H) 36(A) 39(A)  Creatinine 0.76 - 1.27 mg/dL 2.16(H) 2.2(A) 2.2(A)  Sodium 134 - 144 mmol/L 136 135(A) 131(A)  Potassium 3.5 - 5.2 mmol/L 4.2 4.2 4.0  Chloride 96 - 106 mmol/L 96 - -  CO2 20 - 29 mmol/L 24 - -  Calcium 8.7 - 10.2 mg/dL 9.4 - -  Total Protein 6.0 - 8.5 g/dL 6.8 - -  Total Bilirubin 0.0 - 1.2 mg/dL 0.5 - -  Alkaline Phos 39 - 117 IU/L 90 - -  AST 0 - 40 IU/L 21 - -  ALT 0 - 44 IU/L 14 - -   He denies Nephrology discussing the  severity of his CKD or that he should avoid NSAIDs  He is not-fasting today, unable to obtain lipid panel He states he is out of blood glucose test strips, will send in refill   Patient Care Team    Relationship Specialty Notifications Start End  Esaw Grandchild, NP PCP - General Family Medicine  08/19/16   Estanislado Emms, MD Consulting Physician Nephrology  11/24/17     Patient Active Problem List   Diagnosis Date Noted  . Foot pain, right 08/11/2017  . Acute right ankle pain 08/11/2017  . Vitamin D deficiency 05/04/2017  . Encounter for routine adult physical exam with abnormal findings 03/04/2017  . CKD (chronic kidney disease) 12/04/2016  . H/O hypertensive crisis 09/30/2016  . Hyperlipidemia 09/30/2016  . Type 2 diabetes mellitus without complication, without long-term current use of insulin ([place]) 09/30/2016  . Health care maintenance 08/19/2016  . Hypertension 08/19/2016  . Obesity, diabetes, and hypertension syndrome ([place]) 08/19/2016     Past Medical History:  Diagnosis Date  . Diabetes mellitus type II, non insulin dependent ([place])   . Hyperlipidemia   . Hypertension      History reviewed. No pertinent surgical history.   Family History  Problem Relation Age of Onset  . Unexplained death Father 65       Natural causes, no autopsy  . Diabetes Sister      Social History   Substance and Sexual Activity  Drug Use No     Social History   Substance and Sexual Activity  Alcohol Use No     Social History   Tobacco Use  Smoking Status Never Smoker  Smokeless Tobacco Never Used     Outpatient Encounter Medications as of 03/04/2018  Medication Sig  . aspirin EC 81 MG tablet Take by mouth.  Marland Kitchen atorvastatin (LIPITOR) 40 MG tablet Take 1 tablet (40 mg total) by mouth at bedtime.  . B-D ULTRAFINE III SHORT PEN 31G X 8 MM MISC USE FOR INJECTION EVERY DAY  . blood glucose meter kit and supplies KIT Dispense based on patient and insurance preference. Use up  to four times daily as directed. (FOR ICD-9 250.00, 250.01).  . Calcium Carb-Cholecalciferol (CALCIUM 600 + D PO) Take by mouth.  . furosemide (LASIX) 80 MG tablet Take 1 tablet by mouth daily.  Marland Kitchen glucose blood test strip Use to check blood sugars twice daily  . liraglutide (VICTOZA) 18 MG/3ML SOPN Inject 0.1 mLs (0.6 mg total) into the skin daily.  . ramipril (ALTACE) 10 MG capsule Take 1 capsule (10 mg total) by mouth daily. Future refills from Nephrology  . [DISCONTINUED] atorvastatin (LIPITOR) 40 MG tablet TAKE 1 TABLET BY MOUTH AT BEDTIME  . [DISCONTINUED] colchicine 0.6 MG tablet Take two tablets immediately, then  wait an hr and take one tablet- max daily dose 1.65m  . [DISCONTINUED] glucose blood test strip Use to check blood sugars twice daily  . [DISCONTINUED] ramipril (ALTACE) 10 MG capsule Take 10 mg by mouth daily.  .Marland KitchenamLODipine (NORVASC) 10 MG tablet Take 1 tablet (10 mg total) by mouth daily. Please call office to make appointment.  . [DISCONTINUED] amLODipine (NORVASC) 10 MG tablet Take 1 tablet (10 mg total) by mouth daily. Please call office to make appointment. (Patient not taking: Reported on 03/04/2018)  . [DISCONTINUED] ramipril (ALTACE) 10 MG capsule Take 1 capsule by mouth 2 (two) times daily.   No facility-administered encounter medications on file as of 03/04/2018.     Allergies: Penicillins  Body mass index is 36.9 kg/m.  Blood pressure (!) 147/87, pulse 82, temperature 98.4 F (36.9 C), temperature source Oral, height 5' 9.5" (1.765 m), weight 253 lb 8 oz (115 kg), SpO2 97 %.  Review of Systems  Constitutional: Positive for fatigue. Negative for activity change, appetite change, chills, diaphoresis, fever and unexpected weight change.  Eyes: Negative for visual disturbance.  Respiratory: Negative for cough, chest tightness, shortness of breath, wheezing and stridor.   Cardiovascular: Positive for leg swelling. Negative for chest pain and palpitations.   Gastrointestinal: Negative for abdominal distention, abdominal pain, blood in stool, constipation, diarrhea, nausea and vomiting.  Endocrine: Negative for cold intolerance, heat intolerance, polydipsia, polyphagia and polyuria.  Genitourinary: Negative for difficulty urinating, flank pain and hematuria.  Musculoskeletal: Negative for arthralgias, back pain, gait problem, joint swelling, myalgias, neck pain and neck stiffness.  Skin: Negative for color change, pallor, rash and wound.  Neurological: Negative for dizziness, tremors, weakness and light-headedness.  Hematological: Does not bruise/bleed easily.  Psychiatric/Behavioral: Negative for dysphoric mood, self-injury, sleep disturbance and suicidal ideas. The patient is not nervous/anxious and is not hyperactive.        Objective:   Physical Exam  Constitutional: He is oriented to person, place, and time. He appears well-developed and well-nourished. No distress.  HENT:  Head: Normocephalic and atraumatic.  Right Ear: External ear normal. Tympanic membrane is not perforated and not bulging. Decreased hearing is noted.  Left Ear: External ear normal. Tympanic membrane is not perforated and not bulging. Decreased hearing is noted.  Bil hearing aids   Eyes: Pupils are equal, round, and reactive to light. Conjunctivae are normal.  Neck: Normal range of motion. Neck supple.  Cardiovascular: Normal rate, regular rhythm, normal heart sounds and intact distal pulses.  No murmur heard. Pulmonary/Chest: Effort normal and breath sounds normal. No respiratory distress. He has no wheezes. He has no rales. He exhibits no tenderness.  Abdominal: There is no CVA tenderness.  Genitourinary: Rectal exam shows guaiac negative stool.  Musculoskeletal: He exhibits edema.       Right ankle: He exhibits swelling.       Left ankle: He exhibits swelling.       Right lower leg: He exhibits swelling.       Left lower leg: He exhibits swelling.       Right  foot: There is swelling.       Left foot: There is swelling.  Lymphadenopathy:    He has no cervical adenopathy.  Neurological: He is alert and oriented to person, place, and time.  Skin: Skin is warm and dry. No rash noted. He is not diaphoretic. No erythema. There is pallor.  Psychiatric: He has a normal mood and affect. His behavior is normal. Judgment and thought  content normal.  Nursing note and vitals reviewed.     Assessment & Plan:   1. Type 2 diabetes mellitus without complication, without long-term current use of insulin ([place])   2. Health care maintenance   3. Chronic kidney disease, unspecified CKD stage   4. Hypertension, unspecified type   5. Hyperlipidemia, unspecified hyperlipidemia type     Health care maintenance Continue all medications as directed. Refills sent in. Future refills on Ramipril (Altace) needs to come from Nephrology. Avoid all Non-steroidal Anti-inflammatory medications due to your reduced kidney function, ie: Aleve, Motrin, Advil, Naprosyn Do not drink too much fluid. Last appt with Cardiology was 10/2016, recommended to follow-up in 1 year Contact - [address]  [phone] A1c is great 6.1! Continue regular exercise and following diabetic diet. Follow-up with Nephrology Contact- [address]  [phone] Follow-up here in 4 months.  CKD (chronic kidney disease) CMP Latest Ref Rng & Units 11/23/2017 10/07/2017 07/02/2017  Glucose 65 - 99 mg/dL 123(H) - -  BUN 6 - 24 mg/dL 30(H) 36(A) 39(A)  Creatinine 0.76 - 1.27 mg/dL 2.16(H) 2.2(A) 2.2(A)  Sodium 134 - 144 mmol/L 136 135(A) 131(A)  Potassium 3.5 - 5.2 mmol/L 4.2 4.2 4.0  Chloride 96 - 106 mmol/L 96 - -  CO2 20 - 29 mmol/L 24 - -  Calcium 8.7 - 10.2 mg/dL 9.4 - -  Total Protein 6.0 - 8.5 g/dL 6.8 - -  Total Bilirubin 0.0 - 1.2 mg/dL 0.5 - -  Alkaline Phos 39 - 117 IU/L 90 - -  AST 0 - 40 IU/L 21 - -  ALT 0 - 44 IU/L 14 - -   Followed by Nephrology Currently on Ramirpil  83m QD, one RF provided all future fills need to come from Nephrology Advised to avoid all NSAIDs and to watch fluid intake Imperative to keep BP well controlled.  Type 2 diabetes mellitus without complication, without long-term current use of insulin ([facility]) Lab Results  Component Value Date   HGBA1C 6.1 (A) 03/04/2018   HGBA1C 6.5 (A) 11/02/2017   HGBA1C 5.6 03/04/2017   Due to CKD unable to be on Metformin Currently on Liraglutide 0.644mQD AM BS 120-130s, denies episodes of hypoglycemia Continue regular exercise and diabetic diet  Hypertension BP 147/87, HR 82 Discussed at length the importance of strict BP control esp with his CKD Refilled anti-hypertensives, advised to f/u with cards Currently on amlodipine 1041mD, Ramipril 20m68mFurosemide 17m 17mHyperlipidemia Currently on Atorvastatin 40mg 88montinue regular exercise  He is non-fasting labs, need to complete ASAP    FOLLOW-UP:  Return in about 4 months (around 07/03/2018) for Regular Follow Up, HTN, Diabetes, A1c re-check.

## 2018-03-04 ENCOUNTER — Encounter: Payer: Self-pay | Admitting: Adult Health

## 2018-03-04 ENCOUNTER — Ambulatory Visit: Payer: BC Managed Care – PPO | Admitting: Adult Health

## 2018-03-04 VITALS — BP 147/87 | HR 82 | Temp 98.4°F | Ht 69.5 in | Wt 253.5 lb

## 2018-03-04 DIAGNOSIS — E785 Hyperlipidemia, unspecified: Secondary | ICD-10-CM

## 2018-03-04 DIAGNOSIS — N189 Chronic kidney disease, unspecified: Secondary | ICD-10-CM | POA: Diagnosis not present

## 2018-03-04 DIAGNOSIS — Z0000 Encounter for general adult medical examination without abnormal findings: Secondary | ICD-10-CM

## 2018-03-04 DIAGNOSIS — E119 Type 2 diabetes mellitus without complications: Secondary | ICD-10-CM | POA: Diagnosis not present

## 2018-03-04 DIAGNOSIS — I10 Essential (primary) hypertension: Secondary | ICD-10-CM | POA: Diagnosis not present

## 2018-03-04 LAB — POCT GLYCOSYLATED HEMOGLOBIN (HGB A1C): Hemoglobin A1C: 6.1 % — AB (ref 4.0–5.6)

## 2018-03-04 MED ORDER — RAMIPRIL 10 MG PO CAPS: 10.0000 mg | ORAL_CAPSULE | Freq: Every day | ORAL | 0 refills | Status: DC

## 2018-03-04 MED ORDER — GLUCOSE BLOOD VI STRP: ORAL_STRIP | 12 refills | Status: AC

## 2018-03-04 MED ORDER — AMLODIPINE BESYLATE 10 MG PO TABS: 10.0000 mg | ORAL_TABLET | Freq: Every day | ORAL | 1 refills | Status: DC

## 2018-03-04 MED ORDER — ATORVASTATIN CALCIUM 40 MG PO TABS: 40.0000 mg | ORAL_TABLET | Freq: Every day | ORAL | 3 refills | Status: DC

## 2018-03-04 NOTE — Assessment & Plan Note (Addendum)
Currently on Atorvastatin 40mg  QD Continue regular exercise  He is non-fasting labs, need to complete ASAP

## 2018-03-04 NOTE — Assessment & Plan Note (Signed)
CMP Latest Ref Rng & Units 11/23/2017 10/07/2017 07/02/2017  Glucose 65 - 99 mg/dL 629(B123(H) - -  BUN 6 - 24 mg/dL 28(U30(H) 13(K36(A) 44(W39(A)  Creatinine 0.76 - 1.27 mg/dL 1.02(V2.16(H) 2.2(A) 2.2(A)  Sodium 134 - 144 mmol/L 136 135(A) 131(A)  Potassium 3.5 - 5.2 mmol/L 4.2 4.2 4.0  Chloride 96 - 106 mmol/L 96 - -  CO2 20 - 29 mmol/L 24 - -  Calcium 8.7 - 10.2 mg/dL 9.4 - -  Total Protein 6.0 - 8.5 g/dL 6.8 - -  Total Bilirubin 0.0 - 1.2 mg/dL 0.5 - -  Alkaline Phos 39 - 117 IU/L 90 - -  AST 0 - 40 IU/L 21 - -  ALT 0 - 44 IU/L 14 - -   Followed by Nephrology Currently on Ramirpil 10mg  QD, one RF provided all future fills need to come from Nephrology Advised to avoid all NSAIDs and to watch fluid intake Imperative to keep BP well controlled.

## 2018-03-04 NOTE — Assessment & Plan Note (Signed)
>>  ASSESSMENT AND PLAN FOR DIABETES MELLITUS WRITTEN ON 03/04/2018 11:16 AM BY DANFORD, KATY D, NP  Lab Results  Component Value Date   HGBA1C 6.1 (A) 03/04/2018   HGBA1C 6.5 (A) 11/02/2017   HGBA1C 5.6 03/04/2017   Due to CKD unable to be on Metformin Currently on Liraglutide 0.6mg  QD AM BS 120-130s, denies episodes of hypoglycemia Continue regular exercise and diabetic diet

## 2018-03-04 NOTE — Assessment & Plan Note (Signed)
Continue all medications as directed. Refills sent in. Future refills on Ramipril (Altace) needs to come from Nephrology. Avoid all Non-steroidal Anti-inflammatory medications due to your reduced kidney function, ie: Aleve, Motrin, Advil, Naprosyn Do not drink too much fluid. Last appt with Cardiology was 10/2016, recommended to follow-up in 1 year Contact - [address]  [phone] A1c is great 6.1! Continue regular exercise and following diabetic diet. Follow-up with Nephrology Contact- [address]  [phone] Follow-up here in 4 months.

## 2018-03-04 NOTE — Assessment & Plan Note (Signed)
Lab Results  Component Value Date   HGBA1C 6.1 (A) 03/04/2018   HGBA1C 6.5 (A) 11/02/2017   HGBA1C 5.6 03/04/2017   Due to CKD unable to be on Metformin Currently on Liraglutide 0.6mg  QD AM BS 120-130s, denies episodes of hypoglycemia Continue regular exercise and diabetic diet

## 2018-03-04 NOTE — Assessment & Plan Note (Signed)
>>  ASSESSMENT AND PLAN FOR DYSLIPIDEMIA WRITTEN ON 03/04/2018 11:51 AM BY DANFORD, KATY D, NP  Currently on Atorvastatin 40mg  QD Continue regular exercise  He is non-fasting labs, need to complete ASAP

## 2018-03-04 NOTE — Patient Instructions (Addendum)
Hypertension Hypertension, commonly called high blood pressure, is when the force of blood pumping through the arteries is too strong. The arteries are the blood vessels that carry blood from the heart throughout the body. Hypertension forces the heart to work harder to pump blood and may cause arteries to become narrow or stiff. Having untreated or uncontrolled hypertension can cause heart attacks, strokes, kidney disease, and other problems. A blood pressure reading consists of a higher number over a lower number. Ideally, your blood pressure should be below 120/80. The first ("top") number is called the systolic pressure. It is a measure of the pressure in your arteries as your heart beats. The second ("bottom") number is called the diastolic pressure. It is a measure of the pressure in your arteries as the heart relaxes. What are the causes? The cause of this condition is not known. What increases the risk? Some risk factors for high blood pressure are under your control. Others are not. Factors you can change  Smoking.  Having type 2 diabetes mellitus, high cholesterol, or both.  Not getting enough exercise or physical activity.  Being overweight.  Having too much fat, sugar, calories, or salt (sodium) in your diet.  Drinking too much alcohol. Factors that are difficult or impossible to change  Having chronic kidney disease.  Having a family history of high blood pressure.  Age. Risk increases with age.  Race. You may be at higher risk if you are African-American.  Gender. Men are at higher risk than women before age 45. After age 65, women are at higher risk than men.  Having obstructive sleep apnea.  Stress. What are the signs or symptoms? Extremely high blood pressure (hypertensive crisis) may cause:  Headache.  Anxiety.  Shortness of breath.  Nosebleed.  Nausea and vomiting.  Severe chest pain.  Jerky movements you cannot control (seizures).  How is this  diagnosed? This condition is diagnosed by measuring your blood pressure while you are seated, with your arm resting on a surface. The cuff of the blood pressure monitor will be placed directly against the skin of your upper arm at the level of your heart. It should be measured at least twice using the same arm. Certain conditions can cause a difference in blood pressure between your right and left arms. Certain factors can cause blood pressure readings to be lower or higher than normal (elevated) for a short period of time:  When your blood pressure is higher when you are in a health care provider's office than when you are at home, this is called white coat hypertension. Most people with this condition do not need medicines.  When your blood pressure is higher at home than when you are in a health care provider's office, this is called masked hypertension. Most people with this condition may need medicines to control blood pressure.  If you have a high blood pressure reading during one visit or you have normal blood pressure with other risk factors:  You may be asked to return on a different day to have your blood pressure checked again.  You may be asked to monitor your blood pressure at home for 1 week or longer.  If you are diagnosed with hypertension, you may have other blood or imaging tests to help your health care provider understand your overall risk for other conditions. How is this treated? This condition is treated by making healthy lifestyle changes, such as eating healthy foods, exercising more, and reducing your alcohol intake. Your   health care provider may prescribe medicine if lifestyle changes are not enough to get your blood pressure under control, and if:  Your systolic blood pressure is above 130.  Your diastolic blood pressure is above 80.  Your personal target blood pressure may vary depending on your medical conditions, your age, and other factors. Follow these  instructions at home: Eating and drinking  Eat a diet that is high in fiber and potassium, and low in sodium, added sugar, and fat. An example eating plan is called the DASH (Dietary Approaches to Stop Hypertension) diet. To eat this way: ? Eat plenty of fresh fruits and vegetables. Try to fill half of your plate at each meal with fruits and vegetables. ? Eat whole grains, such as whole wheat pasta, brown rice, or whole grain bread. Fill about one quarter of your plate with whole grains. ? Eat or drink low-fat dairy products, such as skim milk or low-fat yogurt. ? Avoid fatty cuts of meat, processed or cured meats, and poultry with skin. Fill about one quarter of your plate with lean proteins, such as fish, chicken without skin, beans, eggs, and tofu. ? Avoid premade and processed foods. These tend to be higher in sodium, added sugar, and fat.  Reduce your daily sodium intake. Most people with hypertension should eat less than 1,500 mg of sodium a day.  Limit alcohol intake to no more than 1 drink a day for nonpregnant women and 2 drinks a day for men. One drink equals 12 oz of beer, 5 oz of wine, or 1 oz of hard liquor. Lifestyle  Work with your health care provider to maintain a healthy body weight or to lose weight. Ask what an ideal weight is for you.  Get at least 30 minutes of exercise that causes your heart to beat faster (aerobic exercise) most days of the week. Activities may include walking, swimming, or biking.  Include exercise to strengthen your muscles (resistance exercise), such as pilates or lifting weights, as part of your weekly exercise routine. Try to do these types of exercises for 30 minutes at least 3 days a week.  Do not use any products that contain nicotine or tobacco, such as cigarettes and e-cigarettes. If you need help quitting, ask your health care provider.  Monitor your blood pressure at home as told by your health care provider.  Keep all follow-up visits as  told by your health care provider. This is important. Medicines  Take over-the-counter and prescription medicines only as told by your health care provider. Follow directions carefully. Blood pressure medicines must be taken as prescribed.  Do not skip doses of blood pressure medicine. Doing this puts you at risk for problems and can make the medicine less effective.  Ask your health care provider about side effects or reactions to medicines that you should watch for. Contact a health care provider if:  You think you are having a reaction to a medicine you are taking.  You have headaches that keep coming back (recurring).  You feel dizzy.  You have swelling in your ankles.  You have trouble with your vision. Get help right away if:  You develop a severe headache or confusion.  You have unusual weakness or numbness.  You feel faint.  You have severe pain in your chest or abdomen.  You vomit repeatedly.  You have trouble breathing. Summary  Hypertension is when the force of blood pumping through your arteries is too strong. If this condition is not   controlled, it may put you at risk for serious complications.  Your personal target blood pressure may vary depending on your medical conditions, your age, and other factors. For most people, a normal blood pressure is less than 120/80.  Hypertension is treated with lifestyle changes, medicines, or a combination of both. Lifestyle changes include weight loss, eating a healthy, low-sodium diet, exercising more, and limiting alcohol. This information is not intended to replace advice given to you by your health care provider. Make sure you discuss any questions you have with your health care provider. Document Released: 03/31/2005 Document Revised: 02/27/2016 Document Reviewed: 02/27/2016 Elsevier Interactive Patient Education  2018 Elsevier Inc.   Chronic Kidney Disease, Adult Chronic kidney disease (CKD) happens when the kidneys  are damaged during a time of 3 or more months. The kidneys are two organs that do many important jobs in the body. These jobs include:  Removing wastes and extra fluids from the blood.  Making hormones that maintain the amount of fluid in your tissues and blood vessels.  Making sure that the body has the right amount of fluids and chemicals.  Most of the time, this condition does not go away, but it can usually be controlled. Steps must be taken to slow down the kidney damage or stop it from getting worse. Otherwise, the kidneys may stop working. Follow these instructions at home:  Follow your diet as told by your doctor. You may need to avoid alcohol, salty foods (sodium), and foods that are high in potassium, calcium, and protein.  Take over-the-counter and prescription medicines only as told by your doctor. Do not take any new medicines unless your doctor says you can do that. These include vitamins and minerals. ? Medicines and nutritional supplements can make kidney damage worse. ? Your doctor may need to change how much medicine you take.  Do not use any tobacco products. These include cigarettes, chewing tobacco, and e-cigarettes. If you need help quitting, ask your doctor.  Keep all follow-up visits as told by your doctor. This is important.  Check your blood pressure. Tell your doctor if there are changes to your blood pressure.  Get to a healthy weight. Stay at that weight. If you need help with this, ask your doctor.  Start or continue an exercise plan. Try to exercise at least 30 minutes a day, 5 days a week.  Stay up-to-date with your shots (immunizations) as told by your doctor. Contact a doctor if:  Your symptoms get worse.  You have new symptoms. Get help right away if:  You have symptoms of end-stage kidney disease. These include: ? Headaches. ? Skin that is darker or lighter than normal. ? Numbness in your hands or feet. ? Easy bruising. ? Having hiccups  often. ? Chest pain. ? Shortness of breath. ? Stopping of menstrual periods in women.  You have a fever.  You are making very little pee (urine).  You have pain or bleeding when you pee (urinate). This information is not intended to replace advice given to you by your health care provider. Make sure you discuss any questions you have with your health care provider. Document Released: 06/25/2009 Document Revised: 09/06/2015 Document Reviewed: 11/28/2011 Elsevier Interactive Patient Education  2017 Elsevier Inc.   Diabetes Mellitus and Nutrition When you have diabetes (diabetes mellitus), it is very important to have healthy eating habits because your blood sugar (glucose) levels are greatly affected by what you eat and drink. Eating healthy foods in the appropriate amounts,  at about the same times every day, can help you:  Control your blood glucose.  Lower your risk of heart disease.  Improve your blood pressure.  Reach or maintain a healthy weight.  Every person with diabetes is different, and each person has different needs for a meal plan. Your health care provider may recommend that you work with a diet and nutrition specialist (dietitian) to make a meal plan that is best for you. Your meal plan may vary depending on factors such as:  The calories you need.  The medicines you take.  Your weight.  Your blood glucose, blood pressure, and cholesterol levels.  Your activity level.  Other health conditions you have, such as heart or kidney disease.  How do carbohydrates affect me? Carbohydrates affect your blood glucose level more than any other type of food. Eating carbohydrates naturally increases the amount of glucose in your blood. Carbohydrate counting is a method for keeping track of how many carbohydrates you eat. Counting carbohydrates is important to keep your blood glucose at a healthy level, especially if you use insulin or take certain oral diabetes medicines. It  is important to know how many carbohydrates you can safely have in each meal. This is different for every person. Your dietitian can help you calculate how many carbohydrates you should have at each meal and for snack. Foods that contain carbohydrates include:  Bread, cereal, rice, pasta, and crackers.  Potatoes and corn.  Peas, beans, and lentils.  Milk and yogurt.  Fruit and juice.  Desserts, such as cakes, cookies, ice cream, and candy.  How does alcohol affect me? Alcohol can cause a sudden decrease in blood glucose (hypoglycemia), especially if you use insulin or take certain oral diabetes medicines. Hypoglycemia can be a life-threatening condition. Symptoms of hypoglycemia (sleepiness, dizziness, and confusion) are similar to symptoms of having too much alcohol. If your health care provider says that alcohol is safe for you, follow these guidelines:  Limit alcohol intake to no more than 1 drink per day for nonpregnant women and 2 drinks per day for men. One drink equals 12 oz of beer, 5 oz of wine, or 1 oz of hard liquor.  Do not drink on an empty stomach.  Keep yourself hydrated with water, diet soda, or unsweetened iced tea.  Keep in mind that regular soda, juice, and other mixers may contain a lot of sugar and must be counted as carbohydrates.  What are tips for following this plan? Reading food labels  Start by checking the serving size on the label. The amount of calories, carbohydrates, fats, and other nutrients listed on the label are based on one serving of the food. Many foods contain more than one serving per package.  Check the total grams (g) of carbohydrates in one serving. You can calculate the number of servings of carbohydrates in one serving by dividing the total carbohydrates by 15. For example, if a food has 30 g of total carbohydrates, it would be equal to 2 servings of carbohydrates.  Check the number of grams (g) of saturated and trans fats in one  serving. Choose foods that have low or no amount of these fats.  Check the number of milligrams (mg) of sodium in one serving. Most people should limit total sodium intake to less than 2,300 mg per day.  Always check the nutrition information of foods labeled as "low-fat" or "nonfat". These foods may be higher in added sugar or refined carbohydrates and should be avoided.  Talk to your dietitian to identify your daily goals for nutrients listed on the label. Shopping  Avoid buying canned, premade, or processed foods. These foods tend to be high in fat, sodium, and added sugar.  Shop around the outside edge of the grocery store. This includes fresh fruits and vegetables, bulk grains, fresh meats, and fresh dairy. Cooking  Use low-heat cooking methods, such as baking, instead of high-heat cooking methods like deep frying.  Cook using healthy oils, such as olive, canola, or sunflower oil.  Avoid cooking with butter, cream, or high-fat meats. Meal planning  Eat meals and snacks regularly, preferably at the same times every day. Avoid going long periods of time without eating.  Eat foods high in fiber, such as fresh fruits, vegetables, beans, and whole grains. Talk to your dietitian about how many servings of carbohydrates you can eat at each meal.  Eat 4-6 ounces of lean protein each day, such as lean meat, chicken, fish, eggs, or tofu. 1 ounce is equal to 1 ounce of meat, chicken, or fish, 1 egg, or 1/4 cup of tofu.  Eat some foods each day that contain healthy fats, such as avocado, nuts, seeds, and fish. Lifestyle   Check your blood glucose regularly.  Exercise at least 30 minutes 5 or more days each week, or as told by your health care provider.  Take medicines as told by your health care provider.  Do not use any products that contain nicotine or tobacco, such as cigarettes and e-cigarettes. If you need help quitting, ask your health care provider.  Work with a Veterinary surgeoncounselor or  diabetes educator to identify strategies to manage stress and any emotional and social challenges. What are some questions to ask my health care provider?  Do I need to meet with a diabetes educator?  Do I need to meet with a dietitian?  What number can I call if I have questions?  When are the best times to check my blood glucose? Where to find more information:  American Diabetes Association: diabetes.org/food-and-fitness/food  Academy of Nutrition and Dietetics: https://www.vargas.com/www.eatright.org/resources/health/diseases-and-conditions/diabetes  [employer] Institute of Diabetes and Digestive and Kidney Diseases (NIH): FindJewelers.czwww.niddk.nih.gov/health-information/diabetes/overview/diet-eating-physical-activity Summary  A healthy meal plan will help you control your blood glucose and maintain a healthy lifestyle.  Working with a diet and nutrition specialist (dietitian) can help you make a meal plan that is best for you.  Keep in mind that carbohydrates and alcohol have immediate effects on your blood glucose levels. It is important to count carbohydrates and to use alcohol carefully. This information is not intended to replace advice given to you by your health care provider. Make sure you discuss any questions you have with your health care provider. Document Released: 12/26/2004 Document Revised: 05/05/2016 Document Reviewed: 05/05/2016 Elsevier Interactive Patient Education  2018 [employer]  Continue all medications as directed. Refills sent in. Future refills on Ramipril (Altace) needs to come from Nephrology. Avoid all Non-steroidal Anti-inflammatory medications due to your reduced kidney function, ie: Aleve, Motrin, Advil, Naprosyn Do not drink too much fluid. Last appt with Cardiology was 10/2016, recommended to follow-up in 1 year Contact - [address]  [phone] A1c is great 6.1! Continue regular exercise and following diabetic diet. Follow-up with Nephrology Contact- [address]  [phone] Follow-up here in 4 months. HAPPY THANKSGIVING. NICE TO SEE YOU!

## 2018-03-04 NOTE — Assessment & Plan Note (Addendum)
BP 147/87, HR 82 Discussed at length the importance of strict BP control esp with his CKD Refilled anti-hypertensives, advised to f/u with cards Currently on amlodipine 10mg  QD, Ramipril 10mQD, Furosemide 7427m QD

## 2018-03-10 ENCOUNTER — Ambulatory Visit: Payer: BC Managed Care – PPO | Admitting: Adult Health

## 2018-04-01 LAB — CBC AND DIFFERENTIAL
HEMATOCRIT: 42 (ref 41–53)
Hemoglobin: 14 (ref 13.5–17.5)
Neutrophils Absolute: 7
PLATELETS: 227 (ref 150–399)
WBC: 9

## 2018-04-01 LAB — BASIC METABOLIC PANEL
BUN: 29 — AB (ref 4–21)
Creatinine: 2.2 — AB (ref <=1.3)
Glucose: 150
Potassium: 4.9 (ref 3.4–5.3)
Sodium: 138 (ref 137–147)

## 2018-04-01 LAB — VITAMIN D 25 HYDROXY (VIT D DEFICIENCY, FRACTURES): Vit D, 25-Hydroxy: 32

## 2018-04-05 ENCOUNTER — Other Ambulatory Visit: Payer: Self-pay | Admitting: Nephrology

## 2018-04-05 DIAGNOSIS — N183 Chronic kidney disease, stage 3 (moderate): Principal | ICD-10-CM

## 2018-04-05 DIAGNOSIS — I129 Hypertensive chronic kidney disease with stage 1 through stage 4 chronic kidney disease, or unspecified chronic kidney disease: Secondary | ICD-10-CM

## 2018-04-05 DIAGNOSIS — I130 Hypertensive heart and chronic kidney disease with heart failure and stage 1 through stage 4 chronic kidney disease, or unspecified chronic kidney disease: Secondary | ICD-10-CM

## 2018-04-06 ENCOUNTER — Encounter: Payer: Self-pay | Admitting: Cardiology

## 2018-04-06 ENCOUNTER — Ambulatory Visit: Payer: BC Managed Care – PPO | Admitting: Cardiology

## 2018-04-06 VITALS — BP 138/74 | HR 73 | Ht 69.0 in | Wt 249.0 lb

## 2018-04-06 DIAGNOSIS — E785 Hyperlipidemia, unspecified: Secondary | ICD-10-CM

## 2018-04-06 DIAGNOSIS — I493 Ventricular premature depolarization: Secondary | ICD-10-CM | POA: Diagnosis not present

## 2018-04-06 DIAGNOSIS — I2510 Atherosclerotic heart disease of native coronary artery without angina pectoris: Secondary | ICD-10-CM

## 2018-04-06 DIAGNOSIS — E118 Type 2 diabetes mellitus with unspecified complications: Secondary | ICD-10-CM

## 2018-04-06 DIAGNOSIS — I252 Old myocardial infarction: Secondary | ICD-10-CM | POA: Diagnosis not present

## 2018-04-06 DIAGNOSIS — I10 Essential (primary) hypertension: Secondary | ICD-10-CM

## 2018-04-06 DIAGNOSIS — Z794 Long term (current) use of insulin: Secondary | ICD-10-CM

## 2018-04-06 DIAGNOSIS — R011 Cardiac murmur, unspecified: Secondary | ICD-10-CM | POA: Diagnosis not present

## 2018-04-06 DIAGNOSIS — N183 Chronic kidney disease, stage 3 unspecified: Secondary | ICD-10-CM

## 2018-04-06 DIAGNOSIS — N2889 Other specified disorders of kidney and ureter: Secondary | ICD-10-CM

## 2018-04-06 DIAGNOSIS — IMO0001 Reserved for inherently not codable concepts without codable children: Secondary | ICD-10-CM

## 2018-04-06 MED ORDER — RAMIPRIL 10 MG PO CAPS: 10.0000 mg | ORAL_CAPSULE | Freq: Every day | ORAL | 3 refills | Status: DC

## 2018-04-06 NOTE — Assessment & Plan Note (Signed)
Followed at [facility] 

## 2018-04-06 NOTE — Assessment & Plan Note (Signed)
Check echo 

## 2018-04-06 NOTE — Assessment & Plan Note (Signed)
On statin Rx- followed by PCP 

## 2018-04-06 NOTE — Assessment & Plan Note (Signed)
Noted on EKG and on exam- asymptomatic

## 2018-04-06 NOTE — Progress Notes (Signed)
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 Gwenlyn Cook with a history of a non-ST elevation MI in July 2017 in [place].  At that time he had mild coronary disease at catheterization with a 40 to 50% diagonal lesion.  It was felt his non-ST elevation MI was secondary to hypertension.  Other medical problems include insulin-dependent diabetes and chronic renal insufficiency stage IIIb.  He is being followed at [facility].  He is here in the office today for an annual checkup.  He denies any chest pain or unusual shortness of breath.  He says he goes to the gym once a week on Saturdays and walks on the treadmill for 30 minutes or so.   Current Outpatient Medications  Medication Sig Dispense Refill  . amLODipine (NORVASC) 10 MG tablet Take 1 tablet (10 mg total) by mouth daily. Please call office to make appointment. 90 tablet 1  . aspirin EC 81 MG tablet Take by mouth.    Marland Kitchen atorvastatin (LIPITOR) 40 MG tablet Take 1 tablet (40 mg total) by mouth at bedtime. 90 tablet 3  . B-D ULTRAFINE III SHORT PEN 31G X 8 MM MISC USE FOR INJECTION EVERY DAY 100 each 11  . blood glucose meter kit and supplies KIT Dispense based on patient and insurance preference. Use up to four times daily as directed. (FOR ICD-9 250.00, 250.01). 1 each 0  . Calcium Carb-Cholecalciferol (CALCIUM 600 + D PO) Take by mouth.    . furosemide (LASIX) 80 MG tablet Take 1 tablet by mouth daily.    Marland Kitchen glucose blood test strip Use to check blood sugars twice daily 200 each 12  . liraglutide (VICTOZA) 18 MG/3ML SOPN Inject 0.1 mLs (0.6 mg total) into the skin daily. 1 pen 3  . ramipril (ALTACE) 10 MG capsule Take 1 capsule (10 mg total) by mouth daily. Future refills from Nephrology 90 capsule 3   No current facility-administered  medications for this visit.     Allergies  Allergen Reactions  . Penicillins Other (See Comments)    Past Medical History:  Diagnosis Date  . Diabetes mellitus type II, non insulin dependent ([place])   . Hyperlipidemia   . Hypertension     Social History   Socioeconomic History  . Marital status: Single    Spouse name: Not on file  . Number of children: Not on file  . Years of education: Not on file  . Highest education level: Not on file  Occupational History  . 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 Financial resource strain: Not on file  . Food insecurity:    Worry: Not on file    Inability: Not on file  . Transportation needs:    Medical: Not on file    Non-medical: Not on file  Tobacco Use  . Smoking status: Never Smoker  . Smokeless tobacco: Never Used  Substance and Sexual Activity  . Alcohol use: No  . Drug use: No  . Sexual activity: Not on file  Lifestyle  . Physical activity:    Days per week: Not on file    Minutes per session: Not on file  . Stress: Not on file  Relationships  . Social connections:  Talks on phone: Not on file    Gets together: Not on file    Attends religious service: Not on file    Active member of club or organization: Not on file    Attends meetings of clubs or organizations: Not on file    Relationship status: Not on file  . Intimate partner violence:    Fear of current or ex partner: Not on file    Emotionally abused: Not on file    Physically abused: Not on file    Forced sexual activity: Not on file  Other Topics Concern  . Not on file  Social History Narrative   Pt lives alone in [place].     Family History  Problem Relation Age of Onset  . Unexplained death Father 25       Natural causes, no autopsy  . Diabetes Sister      Review of Systems: General: negative for chills, fever, night sweats or weight changes.  Cardiovascular: negative for chest pain,  dyspnea on exertion, edema, orthopnea, palpitations, paroxysmal nocturnal dyspnea or shortness of breath Dermatological: negative for rash Respiratory: negative for cough or wheezing Urologic: negative for hematuria Abdominal: negative for nausea, vomiting, diarrhea, bright red blood per rectum, melena, or hematemesis Neurologic: negative for visual changes, syncope, or dizziness All other systems reviewed and are otherwise negative except as noted above.    Blood pressure 138/74, pulse 73, height 5' 9"  (1.753 m), weight 249 lb (112.9 kg).  General appearance: alert, cooperative, no distress and mildly obese Neck: no carotid bruit and no JVD Lungs: clear to auscultation bilaterally Heart: regular rate and rhythm and extra systole and sytolic murmur AOV, LSB noted Extremities: no edema Skin: cool and dry Neurologic: Grossly normal  EKG NSR, PVCs  ASSESSMENT AND PLAN:   History of non-ST elevation myocardial infarction (NSTEMI) NSTEMI secondary to HTN July 2017 ([place])  CAD (coronary artery disease) Mild non obstructive CAD at cath July 2017- 40-50% Dx1  CRI (chronic renal insufficiency), stage 3 (moderate) (HCC) Followed at [facility] hypertension Repeat B/P by me 110/62-regular cuff, lt arm  Insulin dependent diabetes mellitus with complications (HCC) IDDM with CRI  PVC's (premature ventricular contractions) Noted on EKG and on exam- asymptomatic  Dyslipidemia On statin Rx- followed by PCP  Systolic murmur Check echo   PLAN I suggested [name] try and find a way to get in at least 3 to 4 days a week of exercise.  We will refill his HTN medications.  Will defer follow-up of his renal function to [facility] and his lipids to his PCP.  Check echo with PVCs, systolic murmur. We will see him back in a year.  Stanley Ransom PA-C 04/06/2018 9:56 AM

## 2018-04-06 NOTE — Assessment & Plan Note (Signed)
[place] secondary to [place]N July 2017 ([place]

## 2018-04-06 NOTE — Patient Instructions (Signed)
Medication Instructions:  NOT NEEDED If you need a refill on your cardiac medications before your next appointment, please call your pharmacy.   Lab work: NOT NEEDED If you have labs (blood work) drawn today and your tests are completely normal, you will receive your results only by: Marland Kitchen. MyChart Message (if you have MyChart) OR . A paper copy in the mail If you have any lab test that is abnormal or we need to change your treatment, we will call you to review the results.  Testing/Procedures: SCHEDULE AT [address] Your physician has requested that you have an echocardiogram. Echocardiography is a painless test that uses sound waves to create images of your heart. It provides your doctor with information about the size and shape of your heart and how well your heart's chambers and valves are working. This procedure takes approximately one hour. There are no restrictions for this procedure.    Follow-Up: At [facility], you and your health needs are our priority.  As part of our continuing mission to provide you with exceptional heart care, we have created designated Provider Care Teams.  These Care Teams include your primary Cardiologist (physician) and Advanced Practice Providers (APPs -  Physician Assistants and Nurse Practitioners) who all work together to provide you with the care you need, when you need it. You will need a follow up appointment in 12 months.  Please call our office 2 months in advance to schedule this appointment.  You may see DR Allyson SabalBERRY or one of the following Advanced Practice Providers on your designated Care Team:   Corine ShelterLuke Kilroy, PA-C Judy PimpleKrista Kroeger, [place] JerseyPA-C . Marjie Skiffallie Goodrich, PA-C  Any Other Special Instructions Will Be Listed Below (If Applicable).

## 2018-04-06 NOTE — Assessment & Plan Note (Signed)
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

## 2018-04-06 NOTE — Assessment & Plan Note (Signed)
IDDM with CRI 

## 2018-04-06 NOTE — Assessment & Plan Note (Signed)
>>  ASSESSMENT AND PLAN FOR DYSLIPIDEMIA WRITTEN ON 04/06/2018  9:55 AM BY Erlene Quan, PA-C  On statin Rx- followed by PCP

## 2018-04-06 NOTE — Assessment & Plan Note (Signed)
Repeat B/P by me 110/62-regular cuff, lt arm

## 2018-04-06 NOTE — Assessment & Plan Note (Signed)
>>  ASSESSMENT AND PLAN FOR DIABETES MELLITUS WRITTEN ON 04/06/2018  9:54 AM BY Erlene Quan, PA-C  IDDM with CRI

## 2018-04-13 ENCOUNTER — Ambulatory Visit
Admission: RE | Admit: 2018-04-13 | Discharge: 2018-04-13 | Disposition: A | Discharge status: Home / Self Care | Payer: BC Managed Care – PPO | Source: Ambulatory Visit | Attending: Nephrology | Admitting: Nephrology

## 2018-04-13 DIAGNOSIS — I129 Hypertensive chronic kidney disease with stage 1 through stage 4 chronic kidney disease, or unspecified chronic kidney disease: Secondary | ICD-10-CM

## 2018-04-13 DIAGNOSIS — I130 Hypertensive heart and chronic kidney disease with heart failure and stage 1 through stage 4 chronic kidney disease, or unspecified chronic kidney disease: Secondary | ICD-10-CM

## 2018-04-13 DIAGNOSIS — N183 Chronic kidney disease, stage 3 (moderate): Principal | ICD-10-CM

## 2018-04-15 ENCOUNTER — Other Ambulatory Visit: Payer: Self-pay

## 2018-04-15 ENCOUNTER — Ambulatory Visit (HOSPITAL_COMMUNITY): Payer: BC Managed Care – PPO | Attending: Cardiovascular Disease

## 2018-04-15 DIAGNOSIS — I2510 Atherosclerotic heart disease of native coronary artery without angina pectoris: Secondary | ICD-10-CM | POA: Diagnosis not present

## 2018-04-15 DIAGNOSIS — R011 Cardiac murmur, unspecified: Secondary | ICD-10-CM | POA: Diagnosis present

## 2018-04-15 DIAGNOSIS — I252 Old myocardial infarction: Secondary | ICD-10-CM | POA: Insufficient documentation

## 2018-04-15 MED ORDER — PERFLUTREN LIPID MICROSPHERE
1.0000 mL | INTRAVENOUS | Status: AC | PRN
  Administered 2018-04-15: 2 mL via INTRAVENOUS

## 2018-05-18 ENCOUNTER — Ambulatory Visit: Payer: BC Managed Care – PPO | Admitting: Cardiovascular Disease

## 2018-06-03 ENCOUNTER — Telehealth: Payer: Self-pay | Admitting: Adult Health

## 2018-06-03 NOTE — Telephone Encounter (Signed)
Patient is requesting a refill of his Victoza, if approved please send to [facility] on [address]

## 2018-06-04 MED ORDER — LIRAGLUTIDE 18 MG/3ML ~~LOC~~ SOPN: 0.6000 mg | PEN_INJECTOR | Freq: Every day | SUBCUTANEOUS | 1 refills | Status: DC

## 2018-06-04 NOTE — Addendum Note (Signed)
Addended by: Stan Head on: 06/04/2018 09:45 AM   Modules accepted: Orders

## 2018-07-06 ENCOUNTER — Ambulatory Visit: Payer: BC Managed Care – PPO | Admitting: Adult Health

## 2018-08-26 ENCOUNTER — Telehealth: Payer: Self-pay | Admitting: Adult Health

## 2018-08-26 MED ORDER — AMLODIPINE BESYLATE 10 MG PO TABS: 10.0000 mg | ORAL_TABLET | Freq: Every day | ORAL | 0 refills | Status: DC

## 2018-08-26 NOTE — Telephone Encounter (Signed)
Patient called to request refill on :   amLODipine (NORVASC) 10 MG tablet [[id number]]   Order Details  Dose: 10 mg Route: Oral Frequency: Daily  Dispense Quantity: 90 tablet Refills: 1 Fills remaining: --        Sig: Take 1 tablet (10 mg total) by mouth daily. Please call office to make appointment.     &  ---Forwarding request to medical assistant that pt has Appt on Mon 5/18 required for Rx.  --Patient uses this pharmacy:    [facility] [phone] - [place], [place] - [phone] AT [facility] [address] & [address] [phone] (Phone) [phone] (Fax)   --glh

## 2018-08-30 ENCOUNTER — Ambulatory Visit (INDEPENDENT_AMBULATORY_CARE_PROVIDER_SITE_OTHER): Payer: BC Managed Care – PPO | Admitting: Adult Health

## 2018-08-30 ENCOUNTER — Other Ambulatory Visit: Payer: Self-pay

## 2018-08-30 ENCOUNTER — Encounter: Payer: Self-pay | Admitting: Adult Health

## 2018-08-30 VITALS — BP 135/95

## 2018-08-30 DIAGNOSIS — E119 Type 2 diabetes mellitus without complications: Secondary | ICD-10-CM

## 2018-08-30 DIAGNOSIS — N189 Chronic kidney disease, unspecified: Secondary | ICD-10-CM | POA: Diagnosis not present

## 2018-08-30 DIAGNOSIS — I10 Essential (primary) hypertension: Secondary | ICD-10-CM

## 2018-08-30 DIAGNOSIS — E1159 Type 2 diabetes mellitus with other circulatory complications: Secondary | ICD-10-CM

## 2018-08-30 NOTE — Progress Notes (Signed)
Virtual Visit via Telephone Note  I connected with Stanley Cook on 08/30/18 at  9:30 AM EDT by telephone and verified that I am speaking with the correct person using two identifiers.  Location: Patient: Home Provider: In Clinic   I discussed the limitations, risks, security and privacy concerns of performing an evaluation and management service by telephone and the availability of in person appointments. I also discussed with the patient that there may be a patient responsible charge related to this service. The patient expressed understanding and agreed to proceed.   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 in today for regular f/u: HTN, CKD, T2D, Obesity, HLD He reports AM BG 115-135, mean 120s He denies episodes of hypoglycemia. 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 He was seen by his new Nephrologist- Dr. LoHarrie JeansMarch 2020 She reduced Furosemide from 8046mo 32m48mhree times/week- ultimate goal to wean him off completely Per pt, she has instructed him to increase water intake to 2L/day He reports home BP: SBP 130-140s DBP 90-100s Instructed him that future refills on anti-hypertensives need to come from cards Nephrology is managing Furosemide and Ramipril  Lab Results  Component Value Date    HGBA1C 6.1 (A) 03/04/2018   HGBA1C 6.5 (A) 11/02/2017   HGBA1C 5.6 03/04/2017    Patient Care Team    Relationship Specialty Notifications Start End  DanfMina MarbleNP PCP - General Family Medicine  08/19/16   PoweEstanislado Emms Consulting Physician Nephrology  11/24/17     Patient Active Problem List   Diagnosis Date Noted  . Type 2 diabetes mellitus without complication, without long-term current use of insulin (HCC)[place]/18/2020  . History of non-ST elevation myocardial infarction (NSTEMI) 04/06/2018  . CAD (coronary artery disease) 04/06/2018  . PVC's (premature ventricular contractions) 04/06/2018  . Systolic murmur 12/240/81/4481Foot pain, right 08/11/2017  . Acute right ankle pain 08/11/2017  . Vitamin D deficiency 05/04/2017  . Encounter for routine adult physical exam with abnormal findings 03/04/2017  . Chronic kidney disease 12/04/2016  . H/O hypertensive crisis 09/30/2016  . Dyslipidemia 09/30/2016  . Insulin dependent diabetes mellitus with complications (HCC)[place]/185/63/1497Health care maintenance 08/19/2016  . Hypertension associated with diabetes (HCC)[place]/11/2016  . Obesity, diabetes, and hypertension syndrome (HCC)[place]/11/2016     Past Medical History:  Diagnosis Date  . Diabetes mellitus type II, non insulin dependent (HCC)[place]. Hyperlipidemia   . Hypertension      History reviewed. No pertinent surgical history.   Family History  Problem Relation Age of Onset  . Unexplained death Father 76  36   Natural causes, no autopsy  . Diabetes Sister      Social History   Substance and Sexual Activity  Drug Use No     Social History   Substance and Sexual Activity  Alcohol Use No     Social History   Tobacco Use  Smoking Status Never Smoker  Smokeless Tobacco Never Used     Outpatient Encounter Medications as of 08/30/2018  Medication Sig  . amLODipine (NORVASC) 10 MG tablet Take 1 tablet (10 mg total) by mouth daily.  . aspirin EC 81  MG tablet Take by mouth.  . atorvastatin (LIPITOR) 40 MG tablet Take 1 tablet (40 mg total) by mouth at bedtime.  . B-D ULTRAFINE III SHORT PEN 31G X 8 MM MISC USE FOR INJECTION EVERY DAY  . blood glucose meter kit and supplies KIT Dispense based on patient and insurance preference. Use up to four times daily as directed. (FOR ICD-9 250.00, 250.01).  . Calcium Carb-Cholecalciferol (CALCIUM 600 + D PO) Take by mouth.  . glucose blood test strip Use to check blood sugars twice daily  . liraglutide (VICTOZA) 18 MG/3ML SOPN Inject 0.1 mLs (0.6 mg total) into the skin daily.  . ramipril (ALTACE) 10 MG capsule Take 1 capsule (10 mg total) by mouth daily. Future refills from Nephrology  . furosemide (LASIX) 40 MG tablet Take 1 tablet by mouth 3 (three) times a week.  . [DISCONTINUED] furosemide (LASIX) 80 MG tablet Take 1 tablet by mouth daily.   No facility-administered encounter medications on file as of 08/30/2018.     Allergies: Penicillins  There is no height or weight on file to calculate BMI.  Blood pressure (!) 135/95. Review of Systems: General:   No F/C, wt loss Pulm:   No DIB, SOB, pleuritic chest pain Card:  No CP, palpitations Abd:  No n/v/d or pain Ext:  No inc edema from baseline This patient does not have sx concerning for COVID-19 Infection (ie; fever, chills, cough, new or worsening shortness of breath).   Observations/Objective: No acute distress noted during the telephone conversation  Assessment and Plan: Continue all medications as directed. Follow diabetic diet, increase cardiovascular exercise Continue follow-up with Cardiology and Nephrology COVID-19 Education: Signs and symptoms of COVID-19 infection were discussed with pt and how to seek care for testing.  The importance of following the Stay at Home order, and when out- Social Distancing and wearing a facial mask were discussed today.  Follow Up Instructions: Lab Visit this week to recheck A1c Has OV late  June 2020  I discussed the assessment and treatment plan with the patient. The patient was provided an opportunity to ask questions and all were answered. The patient agreed with the plan and demonstrated an understanding of the instructions.   The patient was advised to call back or seek an in-person evaluation if the symptoms worsen or if the condition fails to improve as anticipated.  I provided 15 minutes of non-face-to-face time during this encounter.   Katy D Danford, NP  

## 2018-08-30 NOTE — Assessment & Plan Note (Signed)
Lab Results  Component Value Date   HGBA1C 6.1 (A) 03/04/2018   HGBA1C 6.5 (A) 11/02/2017   HGBA1C 5.6 03/04/2017   Assessment and Plan: Continue all medications as directed. Follow diabetic diet, increase cardiovascular exercise Continue follow-up with Cardiology and Nephrology COVID-19 Education: Signs and symptoms of COVID-19 infection were discussed with pt and how to seek care for testing.  The importance of following the Stay at Home order, and when out- Social Distancing and wearing a facial mask were discussed today.  Follow Up Instructions: Lab Visit this week to recheck A1c Has OV late June 2020  I discussed the assessment and treatment plan with the patient. The patient was provided an opportunity to ask questions and all were answered. The patient agreed with the plan and demonstrated an understanding of the instructions.   The patient was advised to call back or seek an in-person evaluation if the symptoms worsen or if the condition fails to improve as anticipated.

## 2018-08-31 ENCOUNTER — Other Ambulatory Visit: Payer: BC Managed Care – PPO

## 2018-08-31 ENCOUNTER — Telehealth: Payer: Self-pay | Admitting: Adult Health

## 2018-08-31 DIAGNOSIS — E119 Type 2 diabetes mellitus without complications: Secondary | ICD-10-CM

## 2018-08-31 DIAGNOSIS — N189 Chronic kidney disease, unspecified: Secondary | ICD-10-CM

## 2018-08-31 DIAGNOSIS — E1159 Type 2 diabetes mellitus with other circulatory complications: Secondary | ICD-10-CM

## 2018-08-31 NOTE — Telephone Encounter (Signed)
-----   Message from Stan Head, [place] sent at 08/26/2018  9:33 AM EDT ----- Please call pt and advise him that he needs an telemedicine visit prior to any further refills.  Thanks!

## 2018-09-01 ENCOUNTER — Other Ambulatory Visit: Payer: BC Managed Care – PPO

## 2018-09-01 LAB — COMPREHENSIVE METABOLIC PANEL
ALT: 15 IU/L (ref 0–44)
AST: 15 IU/L (ref 0–40)
Albumin/Globulin Ratio: 1.9 (ref 1.2–2.2)
Albumin: 4.4 g/dL (ref 3.8–4.9)
Alkaline Phosphatase: 82 IU/L (ref 39–117)
BUN/Creatinine Ratio: 12 (ref 9–20)
BUN: 24 mg/dL (ref 6–24)
Bilirubin Total: 0.4 mg/dL (ref 0.0–1.2)
CO2: 25 mmol/L (ref 20–29)
Calcium: 9.4 mg/dL (ref 8.7–10.2)
Chloride: 101 mmol/L (ref 96–106)
Creatinine, Ser: 1.97 mg/dL — ABNORMAL HIGH (ref 0.76–1.27)
GFR calc Af Amer: 43 mL/min/{1.73_m2} — ABNORMAL LOW (ref >59)
GFR calc non Af Amer: 37 mL/min/{1.73_m2} — ABNORMAL LOW (ref >59)
Globulin, Total: 2.3 g/dL (ref 1.5–4.5)
Glucose: 126 mg/dL — ABNORMAL HIGH (ref 65–99)
Potassium: 4.7 mmol/L (ref 3.5–5.2)
Sodium: 139 mmol/L (ref 134–144)
Total Protein: 6.7 g/dL (ref 6.0–8.5)

## 2018-09-01 LAB — HEMOGLOBIN A1C
Est. average glucose Bld gHb Est-mCnc: 137 mg/dL
Hgb A1c MFr Bld: 6.4 % — ABNORMAL HIGH (ref 4.8–5.6)

## 2018-09-29 ENCOUNTER — Telehealth: Payer: Self-pay | Admitting: Adult Health

## 2018-09-29 NOTE — Telephone Encounter (Signed)
Please advise 

## 2018-09-29 NOTE — Telephone Encounter (Signed)
Called pt regarding appt 6/24 (he states just had telehealth w/ provider 5/19 doesn't feel this 6/24 appt necessary.  --- Patient does however need a refill on :   liraglutide (VICTOZA) 18 MG/3ML SOPN [[id number]]   Order Details Dose: 0.6 mg Route: Subcutaneous Frequency: Daily  Indications of Use: Type 2 Diabetes Mellitus  Dispense Quantity: 1 pen Refills: 1 Fills remaining: --        Sig: Inject 0.1 mLs (0.6 mg total) into the skin daily     ---Forwarding message to medical assistant for refill order to be sent to :   [facility] [phone] - [place], [address] AT [address] [place] [phone] (Phone) [phone] (Fax)   --glh

## 2018-09-30 NOTE — Telephone Encounter (Signed)
What has his AM BG been running? Thanks! Valetta Fuller

## 2018-09-30 NOTE — Telephone Encounter (Signed)
Pt states that he will make appt. He also needs the Rx sent to his pharmacy for now.

## 2018-09-30 NOTE — Telephone Encounter (Signed)
Please have him schedule OV for Early August Thanks! Valetta Fuller

## 2018-10-01 ENCOUNTER — Other Ambulatory Visit: Payer: Self-pay | Admitting: Adult Health

## 2018-10-04 ENCOUNTER — Telehealth: Payer: Self-pay

## 2018-10-04 NOTE — Telephone Encounter (Signed)
Patient needs follow up appointment in August, 2020.  I do not see an appointment in the system.  Please call pt to schedule f/u.  Charyl Bigger, CMA

## 2018-10-05 ENCOUNTER — Telehealth: Payer: Self-pay | Admitting: Adult Health

## 2018-10-05 MED ORDER — VICTOZA 18 MG/3ML ~~LOC~~ SOPN: PEN_INJECTOR | SUBCUTANEOUS | 0 refills | Status: DC

## 2018-10-05 NOTE — Telephone Encounter (Signed)
Patient called states he went to pick up :   VICTOZA 18 MG/3ML SOPN [[id number]]   Order Details Dose, Route, Frequency: As Directed  Dispense Quantity: 3 mL Refills: 0 Fills remaining: --        Sig: ADMINISTER 0.6 MG UNDER THE SKIN DAILY          ---- Per patient pharmacist would only give him 1 pen, states prescription says only 3ML .   --Forwarding message to medical assistant to review Rx & contact pharmacy with any corrections :    [facility] [place] [place] AT [place] [phone] (Phone) [phone] (Fax)   ----Also please call pt to confirm Rx is correct   --glh

## 2018-10-06 ENCOUNTER — Ambulatory Visit: Payer: BC Managed Care – PPO | Admitting: Adult Health

## 2018-11-20 NOTE — Progress Notes (Signed)
Virtual Visit via Telephone Note  I connected with Stanley Cook on 11/22/2018 at  8:45 AM EDT by telephone and verified that I am speaking with the correct person using two identifiers.  Location: Patient: Home Provider: In Clinic   I discussed the limitations, risks, security and privacy concerns of performing an evaluation and management service by telephone and the availability of in person appointments. I also discussed with the patient that there may be a patient responsible charge related to this service. The patient expressed understanding and agreed to proceed.   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 HRodgercalls in today for regular f/u: HTN, CKD, T2D, Obesity, HLD He reports AM BG 115-135, mean 120s He denies episodes of hypoglycemia. 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 He was seen by his new Nephrologist- Dr. LoHarrie JeansMarch 2020 She reduced Furosemide from 8069mo 38m71mhree times/week- ultimate goal to wean him off completely Per pt, she has instructed him to increase water intake to 2L/day He reports home BP: SBP 130-140s DBP 90-100s Instructed him that future refills on anti-hypertensives need to come from cards Nephrology is managing Furosemide and Ramipril 11/22/2018 OV: Mr. [name] in  for 3 month f/u: HTN, T2D, Obesity AM BG 120s He reports one elevated reading over the weekend- 180s, however he consumed a CHO laiden meal the night before He denies episodes of hypoglycemia He reports home BP: SBP 130-140s DBP 90-100s HRs 80-90 He denies acute cardiac sx's [employer] has re-opened and he has resumed treadmill running on weekends 1 mile at a time He reports that the gym has required temp checks prior to entering the the facility, mask wear and equipment use of spaced >10 feet apart. Advised him to run outdoors instead of gym use- due to possible exposure to COVID  Unable to obtain lipid panel this morning due to him not being fasting He has appt with Cards later this month Lab Results  Component Value Date   HGBA1C 6.1 (A) 11/22/2018   HGBA1C 6.4 (H) 08/31/2018   HGBA1C 6.1 (A) 03/04/2018   Patient Care Team    Relationship Specialty Notifications Start End  Stanley MarbleNP PCP - General Family Medicine  08/19/16   PoweEstanislado Cook Consulting Physician Nephrology  11/24/17     Patient Active Problem List   Diagnosis Date Noted  . Type 2 diabetes mellitus without complication, without long-term current use of insulin (HCC)Stanley Cook/18/2020  . History of non-ST elevation myocardial infarction (NSTEMI) 04/06/2018  . CAD (coronary artery disease) 04/06/2018  . PVC's (premature ventricular contractions) 04/06/2018  . Systolic murmur 12/297/98/9211Foot pain, right 08/11/2017  . Acute right ankle pain 08/11/2017  . Vitamin D deficiency 05/04/2017  . Encounter for routine adult physical exam with abnormal findings  03/04/2017  . Chronic kidney disease 12/04/2016  . H/O hypertensive crisis 09/30/2016  . Dyslipidemia 09/30/2016  . Insulin dependent diabetes mellitus with complications (Stanley Cook) 33/54/5625  . Health care maintenance 08/19/2016  . Hypertension associated with diabetes (Stanley Cook) 08/19/2016  . Obesity, diabetes, and hypertension syndrome (Stanley Cook) 08/19/2016      Past Medical History:  Diagnosis Date  . Diabetes mellitus type II, non insulin dependent (Stanley Cook)   . Hyperlipidemia   . Hypertension      History reviewed. No pertinent surgical history.   Family History  Problem Relation Age of Onset  . Unexplained death Stanley Cook 44       Natural causes, no autopsy  . Diabetes Stanley Cook      Social History   Substance and Sexual Activity  Drug Use No     Social History   Substance and Sexual Activity  Alcohol Use No     Social History   Tobacco Use  Smoking Status Never Smoker  Smokeless Tobacco Never Used     Outpatient Encounter Medications as of 11/22/2018  Medication Sig  . amLODipine (NORVASC) 10 MG tablet Take 1 tablet (10 mg total) by mouth daily.  Marland Kitchen aspirin EC 81 MG tablet Take by mouth.  Marland Kitchen atorvastatin (LIPITOR) 40 MG tablet Take 1 tablet (40 mg total) by mouth at bedtime.  . B-D ULTRAFINE III SHORT PEN 31G X 8 MM MISC USE FOR INJECTION EVERY DAY  . blood glucose meter kit and supplies KIT Dispense based on patient and insurance preference. Use up to four times daily as directed. (FOR ICD-9 250.00, 250.01).  . Cholecalciferol (VITAMIN D3) 125 MCG (5000 UT) CAPS Take 1 capsule by mouth daily.  . furosemide (LASIX) 40 MG tablet Take 1 tablet by mouth 3 (three) times a week.  Marland Kitchen glucose blood test strip Use to check blood sugars twice daily  . liraglutide (VICTOZA) 18 MG/3ML SOPN ADMINISTER 0.6 MG UNDER THE SKIN DAILY  . ramipril (ALTACE) 10 MG capsule Take 1 capsule (10 mg total) by mouth daily. Future refills from Nephrology  . [DISCONTINUED] Calcium Carb-Cholecalciferol (CALCIUM 600 + D PO) Take by mouth.   No facility-administered encounter medications on file as of 11/22/2018.     Allergies: Penicillins  There is no height or weight on file to calculate BMI.  Blood pressure (!) 150/81, pulse (!) 101. Review of Systems: General:   Denies fever, chills, unexplained weight loss.  Optho/Auditory:   Denies  visual changes, blurred vision/LOV Respiratory:   Denies SOB, DOE more than baseline levels.  Cardiovascular:   Denies chest pain, palpitations, new onset peripheral edema  Gastrointestinal:   Denies nausea, vomiting, diarrhea.  Genitourinary: Denies dysuria, freq/ urgency, flank pain or discharge from genitals.  Endocrine:     Denies hot or cold intolerance, polyuria, polydipsia. Musculoskeletal:   Denies unexplained myalgias, joint swelling, unexplained arthralgias, gait problems.  Skin:  Denies rash, suspicious lesions Neurological:     Denies dizziness, unexplained weakness, numbness  Psychiatric/Behavioral:   Denies mood changes, suicidal or homicidal ideations, hallucinations This patient does not have sx concerning for COVID-19 Infection (ie; fever, chills, cough, new or worsening shortness of breath).     Observations/Objective: No acute distress noted during the conversation.  Assessment and Plan: Continue all medications as directed. Continue to drink fluids per Nephrology and follow diabetic diet Recommend outdoor exercise instead of indoor gym activities, re: poss COVID exposure Keep f/u with Cards  Follow Up Instructions: Fasting lab appt in 1-2 weeks, re:  Lipid panel OV 3 months   I discussed the assessment and treatment plan with the patient. The patient was provided an opportunity to ask questions and all were answered. The patient agreed with the plan and demonstrated an understanding of the instructions.   The patient was advised to call back or seek an in-person evaluation if the symptoms worsen or if the condition fails to improve as anticipated.  I provided  18 minutes of non-face-to-face time during this encounter.   Esaw Grandchild, NP

## 2018-11-22 ENCOUNTER — Other Ambulatory Visit: Payer: Self-pay

## 2018-11-22 ENCOUNTER — Encounter: Payer: Self-pay | Admitting: Adult Health

## 2018-11-22 ENCOUNTER — Ambulatory Visit (INDEPENDENT_AMBULATORY_CARE_PROVIDER_SITE_OTHER): Payer: BC Managed Care – PPO | Admitting: Adult Health

## 2018-11-22 VITALS — BP 150/81 | HR 101

## 2018-11-22 DIAGNOSIS — I2510 Atherosclerotic heart disease of native coronary artery without angina pectoris: Secondary | ICD-10-CM

## 2018-11-22 DIAGNOSIS — E785 Hyperlipidemia, unspecified: Secondary | ICD-10-CM | POA: Diagnosis not present

## 2018-11-22 DIAGNOSIS — Z0000 Encounter for general adult medical examination without abnormal findings: Secondary | ICD-10-CM | POA: Diagnosis not present

## 2018-11-22 DIAGNOSIS — E119 Type 2 diabetes mellitus without complications: Secondary | ICD-10-CM | POA: Diagnosis not present

## 2018-11-22 DIAGNOSIS — Z794 Long term (current) use of insulin: Secondary | ICD-10-CM

## 2018-11-22 DIAGNOSIS — E118 Type 2 diabetes mellitus with unspecified complications: Secondary | ICD-10-CM | POA: Diagnosis not present

## 2018-11-22 DIAGNOSIS — IMO0001 Reserved for inherently not codable concepts without codable children: Secondary | ICD-10-CM

## 2018-11-22 DIAGNOSIS — N189 Chronic kidney disease, unspecified: Secondary | ICD-10-CM

## 2018-11-22 LAB — POCT GLYCOSYLATED HEMOGLOBIN (HGB A1C): Hemoglobin A1C: 6.1 % — AB (ref 4.0–5.6)

## 2018-11-22 NOTE — Assessment & Plan Note (Signed)
Lab Results  Component Value Date   HGBA1C 6.1 (A) 11/22/2018   HGBA1C 6.4 (H) 08/31/2018   HGBA1C 6.1 (A) 03/04/2018  Continue with Liraglutide 0.6mg  QD

## 2018-11-22 NOTE — Assessment & Plan Note (Signed)
>>  ASSESSMENT AND PLAN FOR DIABETES MELLITUS WRITTEN ON 11/22/2018  9:23 AM BY DANFORD, KATY D, NP  Lab Results  Component Value Date   HGBA1C 6.1 (A) 11/22/2018   HGBA1C 6.4 (H) 08/31/2018   HGBA1C 6.1 (A) 03/04/2018  Continue with Liraglutide 0.6mg  QD

## 2018-11-22 NOTE — Assessment & Plan Note (Signed)
[facility] followed [phone] Dr. Harrie Jeans She reduced Furosemide from 80mg  to 40mg - three times/week- ultimate goal to wean him off completely Per pt, she has instructed him to increase water intake to 2L/day

## 2018-11-22 NOTE — Assessment & Plan Note (Signed)
Assessment and Plan: Continue all medications as directed. Continue to drink fluids per Nephrology and follow diabetic diet Recommend outdoor exercise instead of indoor gym activities, re: poss COVID exposure Keep f/u with Cards  Follow Up Instructions: Fasting lab appt in 1-2 weeks, re: Lipid panel OV 3 months   I discussed the assessment and treatment plan with the patient. The patient was provided an opportunity to ask questions and all were answered. The patient agreed with the plan and demonstrated an understanding of the instructions.

## 2018-11-22 NOTE — Assessment & Plan Note (Signed)
Followed by cards, has appt this month Unable to obtain lipid panel today- instructed to schedule fasting lab appt in 1-2 weeks

## 2018-11-24 ENCOUNTER — Other Ambulatory Visit: Payer: Self-pay | Admitting: Adult Health

## 2018-11-26 ENCOUNTER — Other Ambulatory Visit: Payer: Self-pay | Admitting: Cardiovascular Disease

## 2018-11-30 ENCOUNTER — Ambulatory Visit: Payer: BC Managed Care – PPO | Admitting: Cardiovascular Disease

## 2018-12-01 ENCOUNTER — Other Ambulatory Visit: Payer: BC Managed Care – PPO

## 2018-12-01 ENCOUNTER — Other Ambulatory Visit: Payer: Self-pay

## 2018-12-01 DIAGNOSIS — E785 Hyperlipidemia, unspecified: Secondary | ICD-10-CM

## 2018-12-02 ENCOUNTER — Encounter: Payer: Self-pay | Admitting: Adult Health

## 2018-12-02 LAB — LIPID PANEL
Chol/HDL Ratio: 3.4 ratio (ref 0.0–5.0)
Cholesterol, Total: 112 mg/dL (ref 100–199)
HDL: 33 mg/dL — ABNORMAL LOW (ref >39)
LDL Calculated: 56 mg/dL (ref 0–99)
Triglycerides: 115 mg/dL (ref 0–149)
VLDL Cholesterol Cal: 23 mg/dL (ref 5–40)

## 2018-12-08 ENCOUNTER — Telehealth (INDEPENDENT_AMBULATORY_CARE_PROVIDER_SITE_OTHER): Payer: BC Managed Care – PPO | Admitting: Cardiovascular Disease

## 2018-12-08 ENCOUNTER — Other Ambulatory Visit: Payer: Self-pay

## 2018-12-08 DIAGNOSIS — I2510 Atherosclerotic heart disease of native coronary artery without angina pectoris: Secondary | ICD-10-CM

## 2018-12-08 DIAGNOSIS — E1159 Type 2 diabetes mellitus with other circulatory complications: Secondary | ICD-10-CM

## 2018-12-08 DIAGNOSIS — E785 Hyperlipidemia, unspecified: Secondary | ICD-10-CM

## 2018-12-08 DIAGNOSIS — I10 Essential (primary) hypertension: Secondary | ICD-10-CM | POA: Diagnosis not present

## 2018-12-08 DIAGNOSIS — I152 Hypertension secondary to endocrine disorders: Secondary | ICD-10-CM

## 2018-12-08 NOTE — Progress Notes (Signed)
Virtual Visit via Telephone Note   This visit type was conducted due to national recommendations for restrictions regarding the COVID-19 Pandemic (e.g. social distancing) in an effort to limit this patient's exposure and mitigate transmission in our community.  Due to his co-morbid illnesses, this patient is at least at moderate risk for complications without adequate follow up.  This format is felt to be most appropriate for this patient at this time.  The patient did not have access to video technology/had technical difficulties with video requiring transitioning to audio format only (telephone).  All issues noted in this document were discussed and addressed.  No physical exam could be performed with this format.  Please refer to the patient's chart for his  consent to telehealth for [facility].   Date:  12/08/2018   ID:  Stanley Cook, Stanley Cook [birth year], MRN [id number]  Patient Location: Home Provider Location: Home  PCP:  Stanley Grandchild, NP  Cardiologist: Stanley Cook Electrophysiologist:  None   Evaluation Performed:  Follow-Up Visit  Chief Complaint: Follow-up CAD and hypertension  History of Present Illness:    Stanley Cook is a 58 y.o. 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  His primary care provider is Stanley Cook .  He was last seen in the office by Stanley Ransom, PA-C on 04/06/2018.  He has a history of treated hypertension, and hyperlipidemia.  He also has diabetes and chronic kidney disease with a serum creatinine in the 2 range.  He does not smoke.  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  He did have a 2D echo performed 04/15/2018 that revealed normal LV function.  He denies chest pain or shortness of breath.  The patient does not have symptoms concerning for COVID-19 infection (fever, chills, cough, or new shortness of breath).    Past Medical History:  Diagnosis Date  .  Diabetes mellitus type II, non insulin dependent ([place])   . Hyperlipidemia   . Hypertension    No past surgical history on file.   Current Meds  Medication Sig  . amLODipine (NORVASC) 10 MG tablet TAKE 1 TABLET(10 MG) BY MOUTH DAILY  . aspirin EC 81 MG tablet Take by mouth.  Marland Kitchen atorvastatin (LIPITOR) 40 MG tablet Take 1 tablet (40 mg total) by mouth at bedtime.  . B-D ULTRAFINE III SHORT PEN 31G X 8 MM MISC USE FOR INJECTION EVERY DAY  . blood glucose meter kit and supplies KIT Dispense based on patient and insurance preference. Use up to four times daily as directed. (FOR ICD-9 250.00, 250.01).  . Cholecalciferol (VITAMIN D3) 125 MCG (5000 UT) CAPS Take 1 capsule by mouth daily.  . furosemide (LASIX) 40 MG tablet Take 1 tablet by mouth 3 (three) times a week.  Marland Kitchen glucose blood test strip Use to check blood sugars twice daily  . liraglutide (VICTOZA) 18 MG/3ML SOPN ADMINISTER 0.6 MG UNDER THE SKIN DAILY  . ramipril (ALTACE) 10 MG capsule Take 1 capsule (10 mg total) by mouth daily. Future refills from Nephrology     Allergies:   Penicillins   Social History   Tobacco Use  . Smoking status: Never Smoker  . Smokeless tobacco: Never Used  Substance Use Topics  . Alcohol use: No  . Drug use: No     Family Hx: The patient's family history includes Diabetes in his sister; Unexplained death (age of onset:  86) in his father.  ROS:   Please see the history of present illness.     All other systems reviewed and are negative.   Prior CV studies:   The following studies were reviewed today:  2D echocardiogram performed 04/15/2018  Labs/Other Tests and Data Reviewed:    EKG:  No ECG reviewed.  Recent Labs: 04/01/2018: Hemoglobin 14.0; Platelets 227 08/31/2018: ALT 15; BUN 24; Creatinine, Ser 1.97; Potassium 4.7; Sodium 139   Recent Lipid Panel Lab Results  Component Value Date/Time   CHOL 112 12/01/2018 08:09 AM   TRIG 115 12/01/2018 08:09 AM   HDL 33 (L) 12/01/2018 08:09 AM    CHOLHDL 3.4 12/01/2018 08:09 AM   LDLCALC 56 12/01/2018 08:09 AM    Wt Readings from Last 3 Encounters:  12/08/18 250 lb (113.4 kg)  04/06/18 249 lb (112.9 kg)  03/04/18 253 lb 8 oz (115 kg)     Objective:    Vital Signs:  BP (!) 151/92 Comment: First reading.  Pulse 88   Ht _0  (1.753 m)   Wt 250 lb (113.4 kg)   BMI 36.92 kg/m    VITAL SIGNS:  reviewed a complete physical exam was not performed today since this was a virtual telemedicine phone visit  ASSESSMENT & PLAN:    1. Coronary artery disease- history of CAD by cath performed in [place] in 7/17 in the setting of non-STEMI demonstrating noncritical CAD.  He said no recurrent symptoms. 2. Essential hypertension- history of essential hypertension with blood pressure measured by the patient at home today at 142/88.  He is on amlodipine and ramipril 3. Hyperlipidemia- history of hyperlipidemia on statin therapy with LDL of 56  COVID-19 Education: The signs and symptoms of COVID-19 were discussed with the patient and how to seek care for testing (follow up with PCP or arrange E-visit).  The importance of social distancing was discussed today.  Time:   Today, I have spent 5 minutes with the patient with telehealth technology discussing the above problems.     Medication Adjustments/Labs and Tests Ordered: Current medicines are reviewed at length with the patient today.  Concerns regarding medicines are outlined above.   Tests Ordered: No orders of the defined types were placed in this encounter.   Medication Changes: No orders of the defined types were placed in this encounter.   Follow Up:  In Person in 1 year(s)  Signed, Quay Burow, MD  12/08/2018 9:26 AM    [place]

## 2018-12-08 NOTE — Patient Instructions (Signed)
Medication Instructions:  Your physician recommends that you continue on your current medications as directed. Please refer to the Current Medication list given to you today.  If you need a refill on your cardiac medications before your next appointment, please call your pharmacy.   Lab work: none If you have labs (blood work) drawn today and your tests are completely normal, you will receive your results only by: Marland Kitchen MyChart Message (if you have MyChart) OR . A paper copy in the mail If you have any lab test that is abnormal or we need to change your treatment, we will call you to review the results.  Testing/Procedures: none  Follow-Up: At [facility], you and your health needs are our priority.  As part of our continuing mission to provide you with exceptional heart care, we have created designated Provider Care Teams.  These Care Teams include your primary Cardiologist (physician) and Advanced Practice Providers (APPs -  Physician Assistants and Nurse Practitioners) who all work together to provide you with the care you need, when you need it. You will need a follow up appointment in July 2021 with Dr. Quay Burow.  Please call our office 2 months in advance to schedule this appointment.

## 2019-02-05 ENCOUNTER — Other Ambulatory Visit: Payer: Self-pay | Admitting: Adult Health

## 2019-02-21 NOTE — Progress Notes (Signed)
Virtual Visit via Telephone Note  I connected with Stanley Cook on 02/22/2019 at  8:45 AM EST by telephone and verified that I am speaking with the correct person using two identifiers.  Location: Patient: Home  Provider: In Clinic   I discussed the limitations, risks, security and privacy concerns of performing an evaluation and management service by telephone and the availability of in person appointments. I also discussed with the patient that there may be a patient responsible charge related to this service. The patient expressed understanding and agreed to proceed.   History of Present Illness: 11/22/2018 OV: Stanley Cook calls in for 3 month f/u: HTN, T2D, Obesity AM BG 120s He reports one elevated reading over the weekend- 180s, however he consumed a CHO laiden meal the night before He denies episodes of hypoglycemia He reports home BP: SBP 130-140s DBP 90-100s HRs 80-90 He denies acute cardiac sx's [employer] has re-opened and he has resumed treadmill running on weekends 1 mile at a time He reports that the gym has required temp checks prior to entering the the facility, mask wear and equipment use of spaced >10 feet apart. Advised him to run outdoors instead of gym use- due to possible exposure to COVID  Unable to obtain lipid panel this morning due to him not being fasting He has appt with Cards later this month Lipid Panel 11/2018 Total 112 TGs 115 HDL 33 LDL-56- at goal  02/22/2019 OV: Stanley Cook calls in for 3 month f/u: HTN, T2D, Obesity, CKD He has stopped going to [employer] due to [place] Virus exposure concerns. He has increased daily walking for "exercise". He reports AM BG 130-140s, with the occasional elevated reading of 160 He denies episodes of hypoglycemia. He reports consistently following diabetic diet. Due to CKD- only on Liraglutide 0.4m QD for diabetes control. Needs updated A1c  Ambulatory BP SBP 120-130 DBP 80-90 He denies acute cardiac  sx's  Last Cards OV/ 12/08/2018- [place]. [place] that revealed noncritical CAD. 2D echo performed 04/15/2018 that revealed normal LV function. Cards recommended annual f/u- no change to Rx recommended.  He has upcoming appt with Nephrology 03/04/2019- on Ramipril  CMP 08/2018 GFR 37 Lab Results  Component Value Date   HGBA1C 6.1 (A) 11/22/2018   HGBA1C 6.4 (H) 08/31/2018   HGBA1C 6.1 (A) 03/04/2018   Patient Care Team    Relationship Specialty Notifications Start End  DMina MarbleD, NP PCP - General Family Medicine  08/19/16   PEstanislado Emms MD Consulting Physician Nephrology  11/24/17     Patient Active Problem List   Diagnosis Date Noted  . Type 2 diabetes mellitus without complication, without long-term current use of insulin ([place] 08/30/2018  . History of non-ST elevation myocardial infarction (NSTEMI) 04/06/2018  . CAD (coronary artery disease) 04/06/2018  . PVC's (premature ventricular contractions) 04/06/2018  . Systolic murmur 103/21/2248 . Foot pain, right 08/11/2017  . Acute right ankle pain 08/11/2017  . Vitamin D deficiency 05/04/2017  . Encounter for routine adult physical exam with abnormal findings 03/04/2017  . Chronic kidney disease 12/04/2016  . H/O hypertensive crisis 09/30/2016  . Dyslipidemia 09/30/2016  . Insulin dependent diabetes mellitus with complications 025/00/3704 . Health care maintenance 08/19/2016  . Hypertension associated with diabetes ([place] 08/19/2016  . Obesity, diabetes, and hypertension syndrome ([facility] 08/19/2016     Past Medical History:  Diagnosis Date  . Diabetes mellitus type II, non insulin dependent [place]   .  Hyperlipidemia   . Hypertension      No past surgical history on file.   Family History  Problem Relation Age of Onset  . Unexplained death Father 13       Natural causes, no autopsy  . Diabetes Sister      Social History   Substance and Sexual Activity  Drug Use No      Social History   Substance and Sexual Activity  Alcohol Use No     Social History   Tobacco Use  Smoking Status Never Smoker  Smokeless Tobacco Never Used     Outpatient Encounter Medications as of 02/22/2019  Medication Sig  . amLODipine (NORVASC) 10 MG tablet TAKE 1 TABLET(10 MG) BY MOUTH DAILY  . aspirin EC 81 MG tablet Take by mouth.  Marland Kitchen atorvastatin (LIPITOR) 40 MG tablet Take 1 tablet (40 mg total) by mouth at bedtime.  . B-D ULTRAFINE III SHORT PEN 31G X 8 MM MISC USE FOR INJECTION EVERY DAY  . blood glucose meter kit and supplies KIT Dispense based on patient and insurance preference. Use up to four times daily as directed. (FOR ICD-9 250.00, 250.01).  . Cholecalciferol (VITAMIN D3) 125 MCG (5000 UT) CAPS Take 1 capsule by mouth daily.  . furosemide (LASIX) 40 MG tablet Take 1 tablet by mouth 3 (three) times a week.  Marland Kitchen glucose blood test strip Use to check blood sugars twice daily  . ramipril (ALTACE) 10 MG capsule Take 1 capsule (10 mg total) by mouth daily. Future refills from Nephrology  . VICTOZA 18 MG/3ML SOPN ADMINISTER 0.6 MG UNDER THE SKIN DAILY   No facility-administered encounter medications on file as of 02/22/2019.     Allergies: Penicillins  There is no height or weight on file to calculate BMI.  There were no vitals taken for this visit. Review of Systems: General:   Denies fever, chills, unexplained weight loss.  Optho/Auditory:   Denies visual changes, blurred vision/LOV Respiratory:   Denies SOB, DOE more than baseline levels.  Cardiovascular:   Denies chest pain, palpitations, new onset peripheral edema  Gastrointestinal:   Denies nausea, vomiting, diarrhea.  Genitourinary: Denies dysuria, freq/ urgency, flank pain or discharge from genitals.  Endocrine:     Denies hot or cold intolerance, polyuria, polydipsia. Musculoskeletal:   Denies unexplained myalgias, joint swelling, unexplained arthralgias, gait problems.  Skin:  Denies rash,  suspicious lesions Neurological:     Denies dizziness, unexplained weakness, numbness  Psychiatric/Behavioral:   Denies mood changes, suicidal or homicidal ideations, hallucinations This patient does not have sx concerning for COVID-19 Infection (ie; fever, chills, cough, new or worsening shortness of breath).   Observations/Objective: No acute distress noted during the telephone conversation.  Assessment and Plan: Continue all medications as directed. Follow Diabetic diet, water intake per Nephrology. Continue daily walking. Continue with Nephrology and Cardiology as directed. Continue to social distance and wear a mask when in public- continue to abstain from going to Gym- exposure risk too high in regards to pandemic.  Follow Up Instructions: 1 week- A1c lab draw 3 months OV with non- fasting labs   I discussed the assessment and treatment plan with the patient. The patient was provided an opportunity to ask questions and all were answered. The patient agreed with the plan and demonstrated an understanding of the instructions.   The patient was advised to call back or seek an in-person evaluation if the symptoms worsen or if the condition fails to improve as anticipated.  I  provided  8 minutes of non-face-to-face time during this encounter.   Esaw Grandchild, NP

## 2019-02-22 ENCOUNTER — Encounter: Payer: Self-pay | Admitting: Adult Health

## 2019-02-22 ENCOUNTER — Other Ambulatory Visit: Payer: Self-pay

## 2019-02-22 ENCOUNTER — Ambulatory Visit (INDEPENDENT_AMBULATORY_CARE_PROVIDER_SITE_OTHER): Payer: BC Managed Care – PPO | Admitting: Adult Health

## 2019-02-22 VITALS — BP 137/93 | HR 89 | Temp 97.4°F

## 2019-02-22 DIAGNOSIS — I10 Essential (primary) hypertension: Secondary | ICD-10-CM

## 2019-02-22 DIAGNOSIS — Z0000 Encounter for general adult medical examination without abnormal findings: Secondary | ICD-10-CM

## 2019-02-22 DIAGNOSIS — E119 Type 2 diabetes mellitus without complications: Secondary | ICD-10-CM | POA: Diagnosis not present

## 2019-02-22 NOTE — Assessment & Plan Note (Signed)
Assessment and Plan: Continue all medications as directed. Follow Diabetic diet, water intake per Nephrology. Continue daily walking. Continue with Nephrology and Cardiology as directed. Continue to social distance and wear a mask when in public- continue to abstain from going to Gym- exposure risk too high in regards to pandemic.  Follow Up Instructions: 1 week- A1c lab draw 3 months OV with non- fasting labs   I discussed the assessment and treatment plan with the patient. The patient was provided an opportunity to ask questions and all were answered. The patient agreed with the plan and demonstrated an understanding of the instructions.   The patient was advised to call back or seek an in-person evaluation if the symptoms worsen or if the condition fails to improve as anticipated.

## 2019-03-08 NOTE — Progress Notes (Signed)
Subjective:    Patient ID: Stanley Cook, male    DOB: [birth year], 58 y.o.   MRN: [id number]  HPI:11/22/2018 OV: Stanley Cook calls in for 3 month f/u: HTN, T2D, Obesity AM BG120s He reports one elevated reading over the weekend- 180s, however he consumed a CHO laiden meal the night before He denies episodes of hypoglycemia He reports home BP: SBP 130-140s DBP 90-100s HRs 80-90 He denies acute cardiac sx's [employer] has re-opened and he has resumed treadmill running on weekends 1 mile at a time He reports that the gym has required temp checks prior to entering the the facility, mask wear and equipment use of spaced >10 feet apart. Advised him to run outdoors instead of gym use- due to possible exposure to COVID  Unable to obtain lipid panel this morning due to him not being fasting He has appt with Cards later this month Lipid Panel 11/2018 Total 112 TGs 115 HDL 33 LDL-56- at goal  02/22/2019 OV: Stanley Cook calls in for 3 month f/u: HTN, T2D, Obesity, CKD He has stopped going to [employer] due to [place] Virus exposure concerns. He has increased daily walking for "exercise". He reports AM BG 130-140s, with the occasional elevated reading of 160 He denies episodes of hypoglycemia. He reports consistently following diabetic diet. Due to CKD- only on Liraglutide 0.33m QD for diabetes control. Needs updated A1c  Ambulatory BP SBP 120-130 DBP 80-90 He denies acute cardiac sx's  Last Cards OV/ 12/08/2018- Dr. [place]lyn [place] and had cath that revealed noncritical CAD. 2D echo performed 04/15/2018 that revealed normal LV function. Cards recommended annual f/u- no change to Rx recommended.  He has upcoming appt with Nephrology 03/04/2019- on Ramipril  CMP 08/2018 GFR 37  03/09/2019 OV: Stanley Cook with concerns about elevated BG AM 120-200, usually <140 Post Prandial- he does not check. He has been walking on lunch break during work approx  15-224ms Encouraged to add in home YouTube exercise program   He is currently on Liraglutide 0.80m57mnce daily injection due to CKD unable to be on Metformin He denies hx of pancreatitis, personal/family hx of MTC He denies difficulties with swallowing  HR has remained stable, no increase noted  08/31/2018 CMP 37  12/01/2018 LDL at goal -56- currently on Atorvastatin 24m71m  Nephrology recently adjusted his anti-hypertensives Furosemide as needed Started Metoprolol XL 25mg49mStopped OTC Vit D supplement Goal SBP 120-130s He reports ambulatory readings SBP 130-140s DBP 70-80s HR 70-80s He denies acute cardiac sx's  Lab Results  Component Value Date   HGBA1C 6.2 (A) 03/09/2019   HGBA1C 6.1 (A) 11/22/2018   HGBA1C 6.4 (H) 08/31/2018   Patient Care Team    Relationship Specialty Notifications Start End  DanfoMina MarbleP PCP - General Family Medicine  08/19/16   PowelEstanislado EmmsConsulting Physician Nephrology  11/24/17     Patient Active Problem List   Diagnosis Date Noted  . Type 2 diabetes mellitus without complication, without long-term current use of insulin (HCC) [place]18/2020  . History of non-ST elevation myocardial infarction (NSTEMI) 04/06/2018  . CAD (coronary artery disease) 04/06/2018  . PVC's (premature ventricular contractions) 04/06/2018  . Systolic murmur 03/2411/45/8099oot pain, right 08/11/2017  . Acute right ankle pain 08/11/2017  . Vitamin D deficiency 05/04/2017  . Encounter for routine adult physical exam with abnormal findings 03/04/2017  . Chronic kidney disease 12/04/2016  . H/O hypertensive crisis 09/30/2016  .  Dyslipidemia 09/30/2016  . Insulin dependent diabetes mellitus with complications 94/17/4081  . Health care maintenance 08/19/2016  . Hypertension associated with diabetes ([place]) 08/19/2016  . Obesity, diabetes, and hypertension syndrome ([place]) 08/19/2016     Past Medical History:  Diagnosis Date  . Diabetes mellitus type II, non  insulin dependent ([place])   . Hyperlipidemia   . Hypertension      History reviewed. No pertinent surgical history.   Family History  Problem Relation Age of Onset  . Unexplained death Father 38       Natural causes, no autopsy  . Diabetes Sister      Social History   Substance and Sexual Activity  Drug Use No     Social History   Substance and Sexual Activity  Alcohol Use No     Social History   Tobacco Use  Smoking Status Never Smoker  Smokeless Tobacco Never Used     Outpatient Encounter Medications as of 03/09/2019  Medication Sig  . amLODipine (NORVASC) 10 MG tablet TAKE 1 TABLET(10 MG) BY MOUTH DAILY  . aspirin EC 81 MG tablet Take by mouth.  Marland Kitchen atorvastatin (LIPITOR) 40 MG tablet Take 1 tablet (40 mg total) by mouth at bedtime.  . B-D ULTRAFINE III SHORT PEN 31G X 8 MM MISC USE FOR INJECTION EVERY DAY  . blood glucose meter kit and supplies KIT Dispense based on patient and insurance preference. Use up to four times daily as directed. (FOR ICD-9 250.00, 250.01).  . furosemide (LASIX) 40 MG tablet Take 40 mg by mouth. One tablet three times weekly as needed  . glucose blood test strip Use to check blood sugars twice daily  . liraglutide (VICTOZA) 18 MG/3ML SOPN 0.72m once daily injection  . metoprolol succinate (TOPROL-XL) 25 MG 24 hr tablet Take 25 mg by mouth daily.  . ramipril (ALTACE) 10 MG capsule Take 1 capsule (10 mg total) by mouth daily. Future refills from Nephrology  . [DISCONTINUED] VICTOZA 18 MG/3ML SOPN ADMINISTER 0.6 MG UNDER THE SKIN DAILY  . [DISCONTINUED] Cholecalciferol (VITAMIN D3) 125 MCG (5000 UT) CAPS Take 1 capsule by mouth daily.  . [DISCONTINUED] furosemide (LASIX) 40 MG tablet Take 1 tablet by mouth 3 (three) times a week.   No facility-administered encounter medications on file as of 03/09/2019.     Allergies: Penicillins  Body mass index is 37.76 kg/m.  Blood pressure 134/75, pulse 85, temperature 98.4 F (36.9 C),  temperature source Oral, weight 255 lb 11.2 oz (116 kg), SpO2 98 %.   Review of Systems  Constitutional: Positive for fatigue. Negative for activity change, appetite change, chills, diaphoresis, fever and unexpected weight change.  HENT: Negative for congestion.   Eyes: Negative for visual disturbance.  Respiratory: Negative for cough, chest tightness, shortness of breath, wheezing and stridor.   Cardiovascular: Negative for chest pain, palpitations and leg swelling.  Gastrointestinal: Negative for abdominal distention, anal bleeding, blood in stool, constipation, diarrhea, nausea and vomiting.  Endocrine: Negative for polydipsia, polyphagia and polyuria.  Genitourinary: Negative for difficulty urinating and frequency.  Neurological: Negative for facial asymmetry and light-headedness.  Hematological: Negative for adenopathy. Does not bruise/bleed easily.  Psychiatric/Behavioral: Negative for agitation, behavioral problems, confusion, decreased concentration, dysphoric mood, hallucinations, self-injury, sleep disturbance and suicidal ideas. The patient is not nervous/anxious and is not hyperactive.       Objective:   Physical Exam Vitals signs and nursing note reviewed.  Constitutional:      General: He is not in acute distress.  Appearance: He is obese. He is not ill-appearing, toxic-appearing or diaphoretic.  HENT:     Head: Normocephalic and atraumatic.  Cardiovascular:     Rate and Rhythm: Normal rate and regular rhythm.     Pulses: Normal pulses.     Heart sounds: Murmur present.  Pulmonary:     Effort: Pulmonary effort is normal. No respiratory distress.     Breath sounds: Normal breath sounds. No stridor. No wheezing, rhonchi or rales.  Chest:     Chest wall: No tenderness.  Musculoskeletal: Normal range of motion.  Skin:    Capillary Refill: Capillary refill takes less than 2 seconds.  Neurological:     Mental Status: He is alert and oriented to person, place, and time.      Coordination: Coordination normal.  Psychiatric:        Mood and Affect: Mood normal.        Behavior: Behavior normal.        Thought Content: Thought content normal.        Judgment: Judgment normal.        Assessment & Plan:   1. Hypertension associated with diabetes ([place])   2. Type 2 diabetes mellitus without complication, without long-term current use of insulin ([place])   3. Dyslipidemia   4. Chronic kidney disease, unspecified CKD stage   5. Health care maintenance     Type 2 diabetes mellitus without complication, without long-term current use of insulin (HCC) Lab Results  Component Value Date   HGBA1C 6.2 (A) 03/09/2019   HGBA1C 6.1 (A) 11/22/2018   HGBA1C 6.4 (H) 08/31/2018  AM 120-200, usually <140 Post Prandial- he does not check. He has been walking on lunch break during work approx 15-66mns Encouraged to add in home YouTube exercise program He is currently on Liraglutide 0.624monce daily injection due to CKD unable to be on Metformin He denies hx of pancreatitis, personal/family hx of MTC He denies difficulties with swallowing  HR has remained stable, no increase noted  Hypertension associated with diabetes (HCC) Furosemide as needed Started Metoprolol XL 2568mD Continued on Amlodipine 53m72m, Ramipril 20mg64mStopped OTC Vit D supplement Goal SBP 120-130s He reports ambulatory readings SBP 130-140s DBP 70-80s HR 70-80s   Dyslipidemia 12/01/2018 LDL at goal -56- currently on Atorvastatin 40mg 25mChronic kidney disease 08/31/2018 CMP 37 [facility] [facility] A1c is 6.2- at goal- continue current Liraglutide dosage. Follow diabetic diet, add in YouTube exercise programs in addition to daily walking. Follow fluid intake guidelines per Nephrologist- follow-up as directed. Continue all medications as directed.  Follow-up with Cardiologist as directed. Continue to social distance and wear a mask when  in public    FOLLOW-UP:  Return in about 3 months (around 06/09/2019).

## 2019-03-09 ENCOUNTER — Other Ambulatory Visit: Payer: Self-pay

## 2019-03-09 ENCOUNTER — Encounter: Payer: Self-pay | Admitting: Adult Health

## 2019-03-09 ENCOUNTER — Other Ambulatory Visit: Payer: BC Managed Care – PPO

## 2019-03-09 ENCOUNTER — Ambulatory Visit (INDEPENDENT_AMBULATORY_CARE_PROVIDER_SITE_OTHER): Payer: BC Managed Care – PPO | Admitting: Adult Health

## 2019-03-09 ENCOUNTER — Other Ambulatory Visit: Payer: Self-pay | Admitting: Adult Health

## 2019-03-09 VITALS — BP 134/75 | HR 85 | Temp 98.4°F | Wt 255.7 lb

## 2019-03-09 DIAGNOSIS — E785 Hyperlipidemia, unspecified: Secondary | ICD-10-CM

## 2019-03-09 DIAGNOSIS — N189 Chronic kidney disease, unspecified: Secondary | ICD-10-CM

## 2019-03-09 DIAGNOSIS — E119 Type 2 diabetes mellitus without complications: Secondary | ICD-10-CM | POA: Diagnosis not present

## 2019-03-09 DIAGNOSIS — Z0000 Encounter for general adult medical examination without abnormal findings: Secondary | ICD-10-CM

## 2019-03-09 DIAGNOSIS — I10 Essential (primary) hypertension: Secondary | ICD-10-CM | POA: Diagnosis not present

## 2019-03-09 DIAGNOSIS — E1159 Type 2 diabetes mellitus with other circulatory complications: Secondary | ICD-10-CM | POA: Diagnosis not present

## 2019-03-09 DIAGNOSIS — I152 Hypertension secondary to endocrine disorders: Secondary | ICD-10-CM

## 2019-03-09 LAB — POCT GLYCOSYLATED HEMOGLOBIN (HGB A1C): Hemoglobin A1C: 6.2 % — AB (ref 4.0–5.6)

## 2019-03-09 MED ORDER — VICTOZA 18 MG/3ML ~~LOC~~ SOPN: PEN_INJECTOR | SUBCUTANEOUS | 0 refills | Status: DC

## 2019-03-09 NOTE — Assessment & Plan Note (Signed)
Furosemide as needed Started Metoprolol XL 25mg  QD Continued on Amlodipine 10mg  QD, Ramipril 20mg  QD Stopped OTC Vit D supplement Goal SBP 120-130s He reports ambulatory readings SBP 130-140s DBP 70-80s HR 70-80s

## 2019-03-09 NOTE — Assessment & Plan Note (Signed)
Lab Results  Component Value Date   HGBA1C 6.2 (A) 03/09/2019   HGBA1C 6.1 (A) 11/22/2018   HGBA1C 6.4 (H) 08/31/2018  AM 120-200, usually <140 Post Prandial- he does not check. He has been walking on lunch break during work approx 15-34mins Encouraged to add in home YouTube exercise program He is currently on Liraglutide 0.6mg  once daily injection due to CKD unable to be on Metformin He denies hx of pancreatitis, personal/family hx of MTC He denies difficulties with swallowing  HR has remained stable, no increase noted

## 2019-03-09 NOTE — Assessment & Plan Note (Signed)
>>  ASSESSMENT AND PLAN FOR DYSLIPIDEMIA WRITTEN ON 03/09/2019 10:49 AM BY DANFORD, KATY D, NP  12/01/2018 LDL at goal -56- currently on Atorvastatin 40mg  QD

## 2019-03-09 NOTE — Assessment & Plan Note (Addendum)
08/31/2018 CMP 37 Followed by [employer]

## 2019-03-09 NOTE — Assessment & Plan Note (Signed)
A1c is 6.2- at goal- continue current Liraglutide dosage. Follow diabetic diet, add in YouTube exercise programs in addition to daily walking. Follow fluid intake guidelines per Nephrologist- follow-up as directed. Continue all medications as directed.  Follow-up with Cardiologist as directed. Continue to social distance and wear a mask when in public

## 2019-03-09 NOTE — Patient Instructions (Addendum)
Diabetes Mellitus and Nutrition, Adult When you have diabetes (diabetes mellitus), it is very important to have healthy eating habits because your blood sugar (glucose) levels are greatly affected by what you eat and drink. Eating healthy foods in the appropriate amounts, at about the same times every day, can help you:  Control your blood glucose.  Lower your risk of heart disease.  Improve your blood pressure.  Reach or maintain a healthy weight. Every person with diabetes is different, and each person has different needs for a meal plan. Your health care provider may recommend that you work with a diet and nutrition specialist (dietitian) to make a meal plan that is best for you. Your meal plan may vary depending on factors such as:  The calories you need.  The medicines you take.  Your weight.  Your blood glucose, blood pressure, and cholesterol levels.  Your activity level.  Other health conditions you have, such as heart or kidney disease. How do carbohydrates affect me? Carbohydrates, also called carbs, affect your blood glucose level more than any other type of food. Eating carbs naturally raises the amount of glucose in your blood. Carb counting is a method for keeping track of how many carbs you eat. Counting carbs is important to keep your blood glucose at a healthy level, especially if you use insulin or take certain oral diabetes medicines. It is important to know how many carbs you can safely have in each meal. This is different for every person. Your dietitian can help you calculate how many carbs you should have at each meal and for each snack. Foods that contain carbs include:  Bread, cereal, rice, pasta, and crackers.  Potatoes and corn.  Peas, beans, and lentils.  Milk and yogurt.  Fruit and juice.  Desserts, such as cakes, cookies, ice cream, and candy. How does alcohol affect me? Alcohol can cause a sudden decrease in blood glucose (hypoglycemia),  especially if you use insulin or take certain oral diabetes medicines. Hypoglycemia can be a life-threatening condition. Symptoms of hypoglycemia (sleepiness, dizziness, and confusion) are similar to symptoms of having too much alcohol. If your health care provider says that alcohol is safe for you, follow these guidelines:  Limit alcohol intake to no more than 1 drink per day for nonpregnant women and 2 drinks per day for men. One drink equals 12 oz of beer, 5 oz of wine, or 1 oz of hard liquor.  Do not drink on an empty stomach.  Keep yourself hydrated with water, diet soda, or unsweetened iced tea.  Keep in mind that regular soda, juice, and other mixers may contain a lot of sugar and must be counted as carbs. What are tips for following this plan?  Reading food labels  Start by checking the serving size on the "Nutrition Facts" label of packaged foods and drinks. The amount of calories, carbs, fats, and other nutrients listed on the label is based on one serving of the item. Many items contain more than one serving per package.  Check the total grams (g) of carbs in one serving. You can calculate the number of servings of carbs in one serving by dividing the total carbs by 15. For example, if a food has 30 g of total carbs, it would be equal to 2 servings of carbs.  Check the number of grams (g) of saturated and trans fats in one serving. Choose foods that have low or no amount of these fats.  Check the number of   milligrams (mg) of salt (sodium) in one serving. Most people should limit total sodium intake to less than 2,300 mg per day.  Always check the nutrition information of foods labeled as "low-fat" or "nonfat". These foods may be higher in added sugar or refined carbs and should be avoided.  Talk to your dietitian to identify your daily goals for nutrients listed on the label. Shopping  Avoid buying canned, premade, or processed foods. These foods tend to be high in fat, sodium,  and added sugar.  Shop around the outside edge of the grocery store. This includes fresh fruits and vegetables, bulk grains, fresh meats, and fresh dairy. Cooking  Use low-heat cooking methods, such as baking, instead of high-heat cooking methods like deep frying.  Cook using healthy oils, such as olive, canola, or sunflower oil.  Avoid cooking with butter, cream, or high-fat meats. Meal planning  Eat meals and snacks regularly, preferably at the same times every day. Avoid going long periods of time without eating.  Eat foods high in fiber, such as fresh fruits, vegetables, beans, and whole grains. Talk to your dietitian about how many servings of carbs you can eat at each meal.  Eat 4-6 ounces (oz) of lean protein each day, such as lean meat, chicken, fish, eggs, or tofu. One oz of lean protein is equal to: ? 1 oz of meat, chicken, or fish. ? 1 egg. ?  cup of tofu.  Eat some foods each day that contain healthy fats, such as avocado, nuts, seeds, and fish. Lifestyle  Check your blood glucose regularly.  Exercise regularly as told by your health care provider. This may include: ? 150 minutes of moderate-intensity or vigorous-intensity exercise each week. This could be brisk walking, biking, or water aerobics. ? Stretching and doing strength exercises, such as yoga or weightlifting, at least 2 times a week.  Take medicines as told by your health care provider.  Do not use any products that contain nicotine or tobacco, such as cigarettes and e-cigarettes. If you need help quitting, ask your health care provider.  Work with a Social worker or diabetes educator to identify strategies to manage stress and any emotional and social challenges. Questions to ask a health care provider  Do I need to meet with a diabetes educator?  Do I need to meet with a dietitian?  What number can I call if I have questions?  When are the best times to check my blood glucose? Where to find more  information:  American Diabetes Association: diabetes.org  Academy of Nutrition and Dietetics: www.eatright.[employer] of Diabetes and Digestive and Kidney Diseases (NIH): [employer] Summary  A healthy meal plan will help you control your blood glucose and maintain a healthy lifestyle.  Working with a diet and nutrition specialist (dietitian) can help you make a meal plan that is best for you.  Keep in mind that carbohydrates (carbs) and alcohol have immediate effects on your blood glucose levels. It is important to count carbs and to use alcohol carefully. This information is not intended to replace advice given to you by your health care provider. Make sure you discuss any questions you have with your health care provider. Document Released: 12/26/2004 Document Revised: 03/13/2017 Document Reviewed: 05/05/2016 Elsevier Patient Education  2020 [place] all medications as directed.   Type 2 Diabetes Mellitus, Self Care, Adult When you have type 2 diabetes (type 2 diabetes mellitus), you must make sure your blood sugar (glucose) stays in a  healthy range. You can do this with:  Nutrition.  Exercise.  Lifestyle changes.  Medicines or insulin, if needed.  Support from your doctors and others. How to stay aware of blood sugar   Check your blood sugar level every day, as often as told.  Have your A1c (hemoglobin A1c) level checked two or more times a year. Have it checked more often if your doctor tells you to. Your doctor will set personal treatment goals for you. Generally, you should have these blood sugar levels:  Before meals (preprandial): 80-130 mg/dL (4.4-7.2 mmol/L).  After meals (postprandial): below 180 mg/dL (10 mmol/L).  A1c level: less than 7%. How to manage high and low blood sugar Signs of high blood sugar High blood sugar is called hyperglycemia. Know the signs of high blood sugar. Signs may include:  Feeling: ?  Thirsty. ? Hungry. ? Very tired.  Needing to pee (urinate) more than usual.  Blurry vision. Signs of low blood sugar Low blood sugar is called hypoglycemia. This is when blood sugar is at or below 70 mg/dL (3.9 mmol/L). Signs may include:  Feeling: ? Hungry. ? Worried or nervous (anxious). ? Sweaty and clammy. ? Confused. ? Dizzy. ? Sleepy. ? Sick to your stomach (nauseous).  Having: ? A fast heartbeat. ? A headache. ? A change in your vision. ? Jerky movements that you cannot control (seizure). ? Tingling or no feeling (numbness) around your mouth, lips, or tongue.  Having trouble with: ? Moving (coordination). ? Sleeping. ? Passing out (fainting). ? Getting upset easily (irritability). Treating low blood sugar To treat low blood sugar, eat or drink something sugary right away. If you can think clearly and swallow safely, follow the 15:15 rule:  Take 15 grams of a fast-acting carb (carbohydrate). Talk with your doctor about how much you should take.  Some fast-acting carbs are: ? Sugar tablets (glucose pills). Take 3-4 pills. ? 6-8 pieces of hard candy. ? 4-6 oz (120-150 mL) of fruit juice. ? 4-6 oz (120-150 mL) of regular (not diet) soda. ? 1 Tbsp (15 mL) honey or sugar.  Check your blood sugar 15 minutes after you take the carb.  If your blood sugar is still at or below 70 mg/dL (3.9 mmol/L), take 15 grams of a carb again.  If your blood sugar does not go above 70 mg/dL (3.9 mmol/L) after 3 tries, get help right away.  After your blood sugar goes back to normal, eat a meal or a snack within 1 hour. Treating very low blood sugar If your blood sugar is at or below 54 mg/dL (3 mmol/L), you have very low blood sugar (severe hypoglycemia). This is an emergency. Do not wait to see if the symptoms will go away. Get medical help right away. Call your local emergency services (911 in the [place] If you have very low blood sugar and you cannot eat or drink, you may need a  glucagon shot (injection). A family member or friend should learn how to check your blood sugar and how to give you a glucagon shot. Ask your doctor if you need to have a glucagon shot kit at home. Follow these instructions at home: Medicine  Take insulin and diabetes medicines as told.  If your doctor says you should take more or less insulin and medicines, do this exactly as told.  Do not run out of insulin or medicines. Having diabetes can raise your risk for other long-term conditions. These include heart disease and kidney disease. Your  doctor may prescribe medicines to help you not have these problems. Food   Make healthy food choices. These include: ? Chicken, fish, egg whites, and beans. ? Oats, whole wheat, bulgur, brown rice, quinoa, and millet. ? Fresh fruits and vegetables. ? Low-fat dairy products. ? Nuts, avocado, olive oil, and canola oil.  Meet with a food specialist (dietitian). He or she can help you make an eating plan that is right for you.  Follow instructions from your doctor about what you cannot eat or drink.  Drink enough fluid to keep your pee (urine) pale yellow.  Keep track of carbs that you eat. Do this by reading food labels and learning food serving sizes.  Follow your sick day plan when you cannot eat or drink normally. Make this plan with your doctor so it is ready to use. Activity  Exercise 3 or more times a week.  Do not go more than 2 days without exercising.  Talk with your doctor before you start a new exercise. Your doctor may need to tell you to change: ? How much insulin or medicines you take. ? How much food you eat. Lifestyle  Do not use any tobacco products. These include cigarettes, chewing tobacco, and e-cigarettes. If you need help quitting, ask your doctor.  Ask your doctor how much alcohol is safe for you.  Learn to deal with stress. If you need help with this, ask your doctor. Body care   Stay up to date with your shots  (immunizations).  Have your eyes and feet checked by a doctor as often as told.  Check your skin and feet every day. Check for cuts, bruises, redness, blisters, or sores.  Brush your teeth and gums two times a day. Floss one or more times a day.  Go to the dentist one or more times every 6 months.  Stay at a healthy weight. General instructions  Take over-the-counter and prescription medicines only as told by your doctor.  Share your diabetes care plan with: ? Your work or school. ? People you live with.  Carry a card or wear jewelry that says you have diabetes.  Keep all follow-up visits as told by your doctor. This is important. Questions to ask your doctor  Do I need to meet with a diabetes educator?  Where can I find a support group for people with diabetes? Where to find more information To learn more about diabetes, visit:  American Diabetes Association: www.diabetes.org  American Association of Diabetes Educators: www.diabeteseducator.org Summary  When you have type 2 diabetes, you must make sure your blood sugar (glucose) stays in a healthy range.  Check your blood sugar every day, as often as told.  Having diabetes can raise your risk for other conditions. Your doctor may prescribe medicines to help you not have these problems.  Keep all follow-up visits as told by your doctor. This is important. This information is not intended to replace advice given to you by your health care provider. Make sure you discuss any questions you have with your health care provider. Document Released: 07/23/2015 Document Revised: 09/21/2017 Document Reviewed: 05/04/2015 Elsevier Patient Education  2020 [place].  A1c is 6.2- at goal- continue current Liraglutide dosage. Follow diabetic diet, add in YouTube exercise programs in addition to daily walking. Follow fluid intake guidelines per Nephrologist- follow-up as directed. Continue all medications as directed.  Follow-up  with Cardiologist as directed. Continue to social distance and wear a mask when in public. GREAT TO SEE  YOU!

## 2019-03-09 NOTE — Assessment & Plan Note (Signed)
12/01/2018 LDL at goal -56- currently on Atorvastatin 40mg  QD

## 2019-03-24 ENCOUNTER — Other Ambulatory Visit: Payer: Self-pay | Admitting: Cardiovascular Disease

## 2019-05-24 ENCOUNTER — Encounter: Payer: Self-pay | Admitting: Adult Health

## 2019-05-24 ENCOUNTER — Other Ambulatory Visit: Payer: Self-pay

## 2019-05-24 ENCOUNTER — Ambulatory Visit (INDEPENDENT_AMBULATORY_CARE_PROVIDER_SITE_OTHER): Payer: BC Managed Care – PPO | Admitting: Adult Health

## 2019-05-24 VITALS — BP 128/90 | HR 69 | Temp 97.3°F

## 2019-05-24 DIAGNOSIS — E119 Type 2 diabetes mellitus without complications: Secondary | ICD-10-CM

## 2019-05-24 DIAGNOSIS — Z0000 Encounter for general adult medical examination without abnormal findings: Secondary | ICD-10-CM | POA: Diagnosis not present

## 2019-05-24 DIAGNOSIS — E785 Hyperlipidemia, unspecified: Secondary | ICD-10-CM

## 2019-05-24 MED ORDER — VICTOZA 18 MG/3ML ~~LOC~~ SOPN: PEN_INJECTOR | SUBCUTANEOUS | 0 refills | Status: DC

## 2019-05-24 NOTE — Assessment & Plan Note (Signed)
>>  ASSESSMENT AND PLAN FOR DYSLIPIDEMIA WRITTEN ON 05/24/2019  4:49 PM BY DANFORD, KATY D, NP  Atorvastatin 40mg  QD LDL goal <70 Lipid panel ordered

## 2019-05-24 NOTE — Assessment & Plan Note (Signed)
Assessment and Plan: Continue all medications as directed. Remain as active as possible. Follow diabetic diet. Continue to check AM BG and BP/HR at home. Call clinic is BG consistently >150 or ever <80 Call clinic if BP consistently <110/60 or >140/90 Continue to social distance and wear a mask when in public. Basic information for mRNA COVID-19 vaccines Please note that these CDC guidelines can change from week to week, and even day to day.  Refer to the [facility] website for the most current recommendations for any given day at [email or web address]   1) Under the EUAs (emergency use authorization), the following age groups are authorized to receive the COVID-19 vaccination: . Pfizer-BioNTech: ages ?16 years . Moderna: ages ?18 years . Children and adolescents outside of these authorized age groups should not receive COVID-19 vaccination at this time   2) You are not eligible to receive a COVID-19 vaccine if: . You have received another vaccine in the last 14-days (example: flu shot, shingles, tetanus, etc.) . You currently have a positive test for COVID-19. The vaccine must be deferred until you have recovered from the acute illness and the criteria has been met for you to discontinue isolation. . You have received passive antibody therapy (monoclonal antibodies or convalescent serum) as treatment for COVID-19 within the past 90 days   3) Currently COVID-19 vaccines are contraindicated in patients who have had an anaphylactic reaction to polyethylene glycol ( ie. GoLYTELY bowel prep or MiraLAX ) or polysorbate products (often used as a thickening agent for many ice creams, frozen custard and whipped dessert products etc).   Also, if you had an anaphylactic reaction to your first COVID-19 vaccination, you cannot receive your second.     - Also if you are currently pregnant please check with your obstetrician to see what their recommendations are regarding vaccination.   -  Also, if you have a chronic condition such as CLL, RA or other immunocompromising conditions, please know that no data are currently available (or very limited data) on the safety and efficacy of mRNA COVID-19 vaccines in persons with autoimmune/ immunocompromising conditions.   Please seek the advice of your specialist regarding whether or not you should receive your COVID-19 vaccination.   Follow Up Instructions: 1 weeks- A1c/lipid panel OV 3 months    I discussed the assessment and treatment plan with the patient. The patient was provided an opportunity to ask questions and all were answered. The patient agreed with the plan and demonstrated an understanding of the instructions.   The patient was advised to call back or seek an in-person evaluation if the symptoms worsen or if the condition fails to improve as anticipated.

## 2019-05-24 NOTE — Assessment & Plan Note (Signed)
Atorvastatin 40mg  QD LDL goal <70 Lipid panel ordered

## 2019-05-24 NOTE — Progress Notes (Signed)
Virtual Visit via Telephone Note  I connected with Stanley Cook on 05/24/19 at  3:00 PM EST by telephone and verified that I am speaking with the correct person using two identifiers.  Location: Patient:Home Provider: In Clinic   I discussed the limitations, risks, security and privacy concerns of performing an evaluation and management service by telephone and the availability of in person appointments. I also discussed with the patient that there may be a patient responsible charge related to this service. The patient expressed understanding and agreed to proceed.   History of Present Illness: 02/22/2019 OV: Stanley Cook calls infor 3 month f/u: HTN, T2D, Obesity, CKD He has stopped going to [employer] due to [place] Virus exposure concerns. He has increased daily walking for "exercise". He reports AM BG 130-140s, with the occasional elevated reading of 160 He denies episodes of hypoglycemia. He reports consistently following diabetic diet. Due to CKD- only on Liraglutide 0.87m QD for diabetes control. Needs updated A1c  Ambulatory BP SBP 120-130 DBP 80-90 He denies acute cardiac sx's  Last Cards OV/ 12/08/2018- Dr. [place] and had cath that revealed noncritical CAD. 2D echo performed 04/15/2018 that revealed normal LV function. Cards recommended annual f/u- no change to Rx recommended.  He has upcoming appt with Nephrology 03/04/2019- on Ramipril  CMP 08/2018 GFR 37  03/09/2019 OV: Mr. HHaripresents with concerns about elevated BG AM 120-200, usually <140 Post Prandial- he does not check. He has been walking on lunch break during work approx 15-242ms Encouraged to add in home YouTube exercise program   He is currently on Liraglutide 0.62m70mnce daily injection due to CKD unable to be on Metformin He denies hx of pancreatitis, personal/family hx of MTC He denies difficulties with swallowing  HR has remained stable, no increase  noted  08/31/2018 CMP 37  12/01/2018 LDL at goal -56- currently on Atorvastatin 60m45m  Nephrology recently adjusted his anti-hypertensives Furosemide as needed Started Metoprolol XL 25mg73mStopped OTC Vit D supplement Goal SBP 120-130s He reports ambulatory readings SBP 130-140s DBP 70-80s HR 70-80s He denies acute cardiac sx's  05/24/2019 OV: Stanley Cook in regular f/u: HTN, CAD, T2D, CKD, HLD  He is closely followed by Nephrology-CKD- nect OV 06/27/2019 He reports AM BG 115-130s He denies episodes of hypoglycemia He reports ambulatory readings SBP 120-130s DBP 70-80s HR 70-80s He denies acute cardiac sx's  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. Discussed COVID-19 vaccine guidelines  Lab Results  Component Value Date   HGBA1C 6.2 (A) 03/09/2019   HGBA1C 6.1 (A) 11/22/2018   HGBA1C 6.4 (H) 08/31/2018   Patient Care Team    Relationship Specialty Notifications Start End  DanfoMina MarbleP PCP - General Family Medicine  08/19/16   PowelEstanislado EmmsConsulting Physician Nephrology  11/24/17     Patient Active Problem List   Diagnosis Date Noted  . Type 2 diabetes mellitus without complication, without long-term current use of insulin (HCC) [place]18/2020  . History of non-ST elevation myocardial infarction (NSTEMI) 04/06/2018  . CAD (coronary artery disease) 04/06/2018  . PVC's (premature ventricular contractions) 04/06/2018  . Systolic murmur 03/2422/55/7322oot pain, right 08/11/2017  . Acute right ankle pain 08/11/2017  . Vitamin D deficiency 05/04/2017  . Encounter for routine adult physical exam with abnormal findings 03/04/2017  . Chronic kidney disease 12/04/2016  . H/O hypertensive  crisis 09/30/2016  . Dyslipidemia 09/30/2016  . Insulin dependent diabetes mellitus with complications 82/99/3716  . Health care maintenance 08/19/2016  . Hypertension associated with  diabetes ([place]) 08/19/2016  . Obesity, diabetes, and hypertension syndrome ([place]) 08/19/2016     Past Medical History:  Diagnosis Date  . Diabetes mellitus type II, non insulin dependent ([place])   . Hyperlipidemia   . Hypertension      History reviewed. No pertinent surgical history.   Family History  Problem Relation Age of Onset  . Unexplained death Father 104       Natural causes, no autopsy  . Diabetes Sister      Social History   Substance and Sexual Activity  Drug Use No     Social History   Substance and Sexual Activity  Alcohol Use No     Social History   Tobacco Use  Smoking Status Never Smoker  Smokeless Tobacco Never Used     Outpatient Encounter Medications as of 05/24/2019  Medication Sig  . amLODipine (NORVASC) 10 MG tablet TAKE 1 TABLET(10 MG) BY MOUTH DAILY  . aspirin EC 81 MG tablet Take by mouth.  Marland Kitchen atorvastatin (LIPITOR) 40 MG tablet TAKE 1 TABLET(40 MG) BY MOUTH AT BEDTIME  . B-D ULTRAFINE III SHORT PEN 31G X 8 MM MISC USE FOR INJECTION EVERY DAY  . blood glucose meter kit and supplies KIT Dispense based on patient and insurance preference. Use up to four times daily as directed. (FOR ICD-9 250.00, 250.01).  . furosemide (LASIX) 40 MG tablet Take 40 mg by mouth. One tablet three times weekly as needed  . glucose blood test strip Use to check blood sugars twice daily  . liraglutide (VICTOZA) 18 MG/3ML SOPN 0.13m once daily injection  . metoprolol succinate (TOPROL-XL) 25 MG 24 hr tablet Take 25 mg by mouth daily.  . ramipril (ALTACE) 10 MG capsule Take 1 capsule (10 mg total) by mouth daily. Future refills from Nephrology  . [DISCONTINUED] liraglutide (VICTOZA) 18 MG/3ML SOPN 0.612monce daily injection   No facility-administered encounter medications on file as of 05/24/2019.    Allergies: Penicillins  There is no height or weight on file to calculate BMI.  Blood pressure 128/90, pulse 69, temperature (!) 97.3 F (36.3 C), temperature  source Temporal. Review of Systems: General:   Denies fever, chills, unexplained weight loss.  Optho/Auditory:   Denies visual changes, blurred vision/LOV Respiratory:   Denies SOB, DOE more than baseline levels.  Cardiovascular:   Denies chest pain, palpitations, new onset peripheral edema  Gastrointestinal:   Denies nausea, vomiting, diarrhea.  Genitourinary: Denies dysuria, freq/ urgency, flank pain or discharge from genitals.  Endocrine:     Denies hot or cold intolerance, polyuria, polydipsia. Musculoskeletal:   Denies unexplained myalgias, joint swelling, unexplained arthralgias, gait problems.  Skin:  Denies rash, suspicious lesions Neurological:     Denies dizziness, unexplained weakness, numbness  Psychiatric/Behavioral:   Denies mood changes, suicidal or homicidal ideations, hallucinations   Observations/Objective: No acute distress noted during telephone conversation  Assessment and Plan: Continue all medications as directed. Remain as active as possible. Follow diabetic diet. Continue to check AM BG and BP/HR at home. Call clinic is BG consistently >150 or ever <80 Call clinic if BP consistently <110/60 or >140/90 Continue to social distance and wear a mask when in public. Basic information for mRNA COVID-19 vaccines Please note that these CDC guidelines can change from week to week, and even day to day.  Refer to  the CDC website for the most current recommendations for any given day at [employer]   1) Under the EUAs (emergency use authorization), the following age groups are authorized to receive the COVID-19 vaccination: . Pfizer-BioNTech: ages ?16 years . Moderna: ages ?18 years . Children and adolescents outside of these authorized age groups should not receive COVID-19 vaccination at this time   2) You are not eligible to receive a COVID-19 vaccine if: . You have received another vaccine in the last 14-days (example: flu shot, shingles,  tetanus, etc.) . You currently have a positive test for COVID-19. The vaccine must be deferred until you have recovered from the acute illness and the criteria has been met for you to discontinue isolation. . You have received passive antibody therapy (monoclonal antibodies or convalescent serum) as treatment for COVID-19 within the past 90 days   3) Currently COVID-19 vaccines are contraindicated in patients who have had an anaphylactic reaction to polyethylene glycol ( ie. GoLYTELY bowel prep or MiraLAX ) or polysorbate products (often used as a thickening agent for many ice creams, frozen custard and whipped dessert products etc).   Also, if you had an anaphylactic reaction to your first COVID-19 vaccination, you cannot receive your second.     - Also if you are currently pregnant please check with your obstetrician to see what their recommendations are regarding vaccination.   - Also, if you have a chronic condition such as CLL, RA or other immunocompromising conditions, please know that no data are currently available (or very limited data) on the safety and efficacy of mRNA COVID-19 vaccines in persons with autoimmune/ immunocompromising conditions.   Please seek the advice of your specialist regarding whether or not you should receive your COVID-19 vaccination.   Follow Up Instructions: 1 weeks- A1c/lipid panel OV 3 months    I discussed the assessment and treatment plan with the patient. The patient was provided an opportunity to ask questions and all were answered. The patient agreed with the plan and demonstrated an understanding of the instructions.   The patient was advised to call back or seek an in-person evaluation if the symptoms worsen or if the condition fails to improve as anticipated.  I provided 18  minutes of non-face-to-face time during this encounter.   Esaw Grandchild, NP

## 2019-05-24 NOTE — Assessment & Plan Note (Signed)
Lab Results  Component Value Date   HGBA1C 6.2 (A) 03/09/2019   HGBA1C 6.1 (A) 11/22/2018   HGBA1C 6.4 (H) 08/31/2018   CKD- unable to use Metformin Currently on Liraglutide 0.6mg  QD

## 2019-05-25 ENCOUNTER — Ambulatory Visit: Payer: BC Managed Care – PPO | Admitting: Adult Health

## 2019-05-26 ENCOUNTER — Other Ambulatory Visit: Payer: Self-pay | Admitting: Cardiology

## 2019-05-26 ENCOUNTER — Other Ambulatory Visit: Payer: Self-pay

## 2019-05-26 DIAGNOSIS — E785 Hyperlipidemia, unspecified: Secondary | ICD-10-CM

## 2019-05-26 DIAGNOSIS — E119 Type 2 diabetes mellitus without complications: Secondary | ICD-10-CM

## 2019-05-31 ENCOUNTER — Other Ambulatory Visit: Payer: Self-pay

## 2019-05-31 MED ORDER — RAMIPRIL 10 MG PO CAPS: 10.0000 mg | ORAL_CAPSULE | Freq: Every day | ORAL | 3 refills | Status: DC

## 2019-06-01 ENCOUNTER — Other Ambulatory Visit: Payer: Self-pay

## 2019-06-01 ENCOUNTER — Other Ambulatory Visit: Payer: BC Managed Care – PPO

## 2019-06-01 DIAGNOSIS — E119 Type 2 diabetes mellitus without complications: Secondary | ICD-10-CM

## 2019-06-01 DIAGNOSIS — E785 Hyperlipidemia, unspecified: Secondary | ICD-10-CM

## 2019-06-02 LAB — HEMOGLOBIN A1C
Est. average glucose Bld gHb Est-mCnc: 137 mg/dL
Hgb A1c MFr Bld: 6.4 % — ABNORMAL HIGH (ref 4.8–5.6)

## 2019-06-02 LAB — LIPID PANEL
Chol/HDL Ratio: 3.2 ratio (ref 0.0–5.0)
Cholesterol, Total: 108 mg/dL (ref 100–199)
HDL: 34 mg/dL — ABNORMAL LOW (ref >39)
LDL Chol Calc (NIH): 55 mg/dL (ref 0–99)
Triglycerides: 102 mg/dL (ref 0–149)
VLDL Cholesterol Cal: 19 mg/dL (ref 5–40)

## 2019-06-11 ENCOUNTER — Ambulatory Visit: Payer: BC Managed Care – PPO | Attending: Internal Medicine

## 2019-06-11 DIAGNOSIS — Z23 Encounter for immunization: Secondary | ICD-10-CM

## 2019-06-11 NOTE — Progress Notes (Signed)
   Covid-19 Vaccination Clinic  Name:  Stanley Cook    MRN: [id number] DOB: September 13, 1960  06/11/2019  Mr. Stanley Cook was observed post Covid-19 immunization for 15 minutes without incidence. He was provided with Vaccine Information Sheet and instruction to access the V-Safe system.   Mr. Stanley Cook was instructed to call 911 with any severe reactions post vaccine: Marland Kitchen Difficulty breathing  . Swelling of your face and throat  . A fast heartbeat  . A bad rash all over your body  . Dizziness and weakness    Immunizations Administered    Name Date Dose VIS Date Route   Pfizer COVID-19 Vaccine 06/11/2019  3:08 PM 0.3 mL 03/25/2019 Intramuscular   Manufacturer: [employer], [employer]   Lot: [id number]   NDC: [address]-6

## 2019-06-13 ENCOUNTER — Other Ambulatory Visit: Payer: Self-pay | Admitting: Adult Health

## 2019-07-02 ENCOUNTER — Ambulatory Visit: Payer: BC Managed Care – PPO | Attending: Internal Medicine

## 2019-07-02 DIAGNOSIS — Z23 Encounter for immunization: Secondary | ICD-10-CM

## 2019-07-02 NOTE — Progress Notes (Signed)
   Covid-19 Vaccination Clinic  Name:  Stanley Cook    MRN: [id number] DOB: 09/28/1960  07/02/2019  Stanley Cook was observed post Covid-19 immunization for 15 minutes without incident. He was provided with Vaccine Information Sheet and instruction to access the V-Safe system.   Stanley Cook was instructed to call 911 with any severe reactions post vaccine: Marland Kitchen Difficulty breathing  . Swelling of face and throat  . A fast heartbeat  . A bad rash all over body  . Dizziness and weakness   Immunizations Administered    Name Date Dose VIS Date Route   Pfizer COVID-19 Vaccine 07/02/2019  9:42 AM 0.3 mL 03/25/2019 Intramuscular   Manufacturer: [employer], [employer]   Lot: [id number]   NDC: [address]-6

## 2019-07-26 ENCOUNTER — Other Ambulatory Visit: Payer: Self-pay | Admitting: Adult Health

## 2019-07-26 MED ORDER — VICTOZA 18 MG/3ML ~~LOC~~ SOPN: PEN_INJECTOR | SUBCUTANEOUS | 0 refills | Status: DC

## 2019-07-26 NOTE — Telephone Encounter (Signed)
Pt called states pharmacy has sent refill request for :  (No response frm us--advised it went to Katy's basket:   liraglutide (VICTOZA) 18 MG/3ML SOPN [[id number]]   Order Details Dose, Route, Frequency: As Directed  Dispense Quantity: 9 mL Refills: 0       Sig: ADMINISTER 0.6 MG UNDER THE SKIN EVERY DAY       ---Pt's LOV 05/24/19   --Forwarding request to med asst to send refill order to :   [facility] [phone] - [place], [place] - [address] AT [address] [facility] [address] & [address] [phone] (Phone) [phone] (Fax)   --glh

## 2019-07-26 NOTE — Telephone Encounter (Signed)
Refill sent to pharmacy. AS, CMA 

## 2019-07-28 ENCOUNTER — Other Ambulatory Visit: Payer: Self-pay

## 2019-07-28 MED ORDER — AMLODIPINE BESYLATE 10 MG PO TABS: ORAL_TABLET | ORAL | 3 refills | Status: DC

## 2019-07-29 ENCOUNTER — Other Ambulatory Visit: Payer: Self-pay

## 2019-08-24 ENCOUNTER — Other Ambulatory Visit: Payer: Self-pay | Admitting: Adult Health

## 2019-09-13 ENCOUNTER — Other Ambulatory Visit: Payer: Self-pay | Admitting: Adult Health

## 2019-09-13 NOTE — Telephone Encounter (Signed)
Pt came by office to schedule appt & to request refill on :   liraglutide (VICTOZA) 18 MG/3ML SOPN [[id number]]   Order Details Dose, Route, Frequency: As Directed  Dispense Quantity: 9 mL Refills: 0       Sig: ADMINISTER 0.6 MG UNDER THE SKIN EVERY DAY   Please send refill order to :   [facility] [phone] - [place], [place] - [address] AT [facility] [address] & [address] [phone] (Phone) [phone] (Fax)   Please contact pt if there are any questions or concerns.  -glh

## 2019-09-14 MED ORDER — VICTOZA 18 MG/3ML ~~LOC~~ SOPN: PEN_INJECTOR | SUBCUTANEOUS | 0 refills | Status: DC

## 2019-09-14 NOTE — Telephone Encounter (Signed)
Refill sent to requested pharmacy. AS, CMA 

## 2019-09-21 ENCOUNTER — Other Ambulatory Visit: Payer: Self-pay | Admitting: Adult Health

## 2019-09-23 ENCOUNTER — Other Ambulatory Visit: Payer: Self-pay | Admitting: Physician Assistant

## 2019-09-23 MED ORDER — ATORVASTATIN CALCIUM 40 MG PO TABS: ORAL_TABLET | ORAL | 0 refills | Status: DC

## 2019-09-23 NOTE — Telephone Encounter (Signed)
Patient called requested refill of :   atorvastatin (LIPITOR) 40 MG tablet [[id number]]   Order Details Dose, Route, Frequency: As Directed  Dispense Quantity: 90 tablet Refills: 1       Sig: TAKE 1 TABLET(40 MG) BY MOUTH AT BEDTIME       Forwarding request to med asst to send refill order to :  [facility] [phone] - [place], [place] - [phone] AT [facility] OF [address] & [address] Phone:  [phone]  Fax:  [phone]     --glh

## 2019-10-11 ENCOUNTER — Encounter: Payer: Self-pay | Admitting: Physician Assistant

## 2019-10-11 ENCOUNTER — Ambulatory Visit (INDEPENDENT_AMBULATORY_CARE_PROVIDER_SITE_OTHER): Payer: BC Managed Care – PPO | Admitting: Physician Assistant

## 2019-10-11 ENCOUNTER — Other Ambulatory Visit: Payer: Self-pay

## 2019-10-11 VITALS — BP 154/82 | HR 82 | Temp 97.6°F | Ht 68.9 in | Wt 256.3 lb

## 2019-10-11 DIAGNOSIS — I10 Essential (primary) hypertension: Secondary | ICD-10-CM

## 2019-10-11 DIAGNOSIS — E1169 Type 2 diabetes mellitus with other specified complication: Secondary | ICD-10-CM | POA: Diagnosis not present

## 2019-10-11 DIAGNOSIS — E1159 Type 2 diabetes mellitus with other circulatory complications: Secondary | ICD-10-CM | POA: Diagnosis not present

## 2019-10-11 DIAGNOSIS — E785 Hyperlipidemia, unspecified: Secondary | ICD-10-CM

## 2019-10-11 DIAGNOSIS — N189 Chronic kidney disease, unspecified: Secondary | ICD-10-CM | POA: Diagnosis not present

## 2019-10-11 DIAGNOSIS — E119 Type 2 diabetes mellitus without complications: Secondary | ICD-10-CM

## 2019-10-11 DIAGNOSIS — I152 Hypertension secondary to endocrine disorders: Secondary | ICD-10-CM

## 2019-10-11 DIAGNOSIS — Z79899 Other long term (current) drug therapy: Secondary | ICD-10-CM

## 2019-10-11 LAB — POCT GLYCOSYLATED HEMOGLOBIN (HGB A1C): Hemoglobin A1C: 6.5 % — AB (ref 4.0–5.6)

## 2019-10-11 LAB — POCT UA - MICROALBUMIN
Creatinine, POC: 200 mg/dL
Microalbumin Ur, POC: 150 mg/L

## 2019-10-11 NOTE — Assessment & Plan Note (Addendum)
-   A1c today is 6.5, stable - Discussed with patient the importance of controlling diabetes to help preserve kidney function. - UA microalbumin is abnormal, A:C 30-300 mg. - Continue ambulatory glucose monitoring and notify clinic if FBS consistently <80 or >160. -Follow low glucose/carbohydrate diet, reduce sweets and popcorn. -Placed ophthalmology referral for annual diabetic eye exam.

## 2019-10-11 NOTE — Assessment & Plan Note (Signed)
>>  ASSESSMENT AND PLAN FOR DYSLIPIDEMIA WRITTEN ON 10/11/2019  1:41 PM BY ABONZA, MARITZA, PA-C  - Last lipid panel wnl's with low HDL, LDL 56 - Continue atorvastatin 40 mg. - Follow heart healthy diet.  - Rechecking lipid panel at next office visit.

## 2019-10-11 NOTE — Assessment & Plan Note (Addendum)
-   BP today is 178/78 HR 82, recheck BP 154/82 above goal. - Followed by Nephrology and has an appointment 07/30. Advised to continue ambulatory and pulse monitoring, if BP consistently >130/80 then recommend to discuss with his Nephrologist to determine if medication regimen needs to be adjusted. -Review most recent note in chart (10/2018), Dr. Vallery Sa had increased Ramipril because BP suboptimal.  - Recommended to work on reducing sodium and follow DASH diet.  - Continue Amlodipine 10 mg, Ramipril 10 mg, Metoprolol 25 mg. - Continue physical activity level.

## 2019-10-11 NOTE — Patient Instructions (Addendum)
Diabetes Mellitus and Nutrition, Adult When you have diabetes (diabetes mellitus), it is very important to have healthy eating habits because your blood sugar (glucose) levels are greatly affected by what you eat and drink. Eating healthy foods in the appropriate amounts, at about the same times every day, can help you:  Control your blood glucose.  Lower your risk of heart disease.  Improve your blood pressure.  Reach or maintain a healthy weight. Every person with diabetes is different, and each person has different needs for a meal plan. Your health care provider may recommend that you work with a diet and nutrition specialist (dietitian) to make a meal plan that is best for you. Your meal plan may vary depending on factors such as:  The calories you need.  The medicines you take.  Your weight.  Your blood glucose, blood pressure, and cholesterol levels.  Your activity level.  Other health conditions you have, such as heart or kidney disease. How do carbohydrates affect me? Carbohydrates, also called carbs, affect your blood glucose level more than any other type of food. Eating carbs naturally raises the amount of glucose in your blood. Carb counting is a method for keeping track of how many carbs you eat. Counting carbs is important to keep your blood glucose at a healthy level, especially if you use insulin or take certain oral diabetes medicines. It is important to know how many carbs you can safely have in each meal. This is different for every person. Your dietitian can help you calculate how many carbs you should have at each meal and for each snack. Foods that contain carbs include:  Bread, cereal, rice, pasta, and crackers.  Potatoes and corn.  Peas, beans, and lentils.  Milk and yogurt.  Fruit and juice.  Desserts, such as cakes, cookies, ice cream, and candy. How does alcohol affect me? Alcohol can cause a sudden decrease in blood glucose (hypoglycemia),  especially if you use insulin or take certain oral diabetes medicines. Hypoglycemia can be a life-threatening condition. Symptoms of hypoglycemia (sleepiness, dizziness, and confusion) are similar to symptoms of having too much alcohol. If your health care provider says that alcohol is safe for you, follow these guidelines:  Limit alcohol intake to no more than 1 drink per day for nonpregnant women and 2 drinks per day for men. One drink equals 12 oz of beer, 5 oz of wine, or 1 oz of hard liquor.  Do not drink on an empty stomach.  Keep yourself hydrated with water, diet soda, or unsweetened iced tea.  Keep in mind that regular soda, juice, and other mixers may contain a lot of sugar and must be counted as carbs. What are tips for following this plan?  Reading food labels  Start by checking the serving size on the "Nutrition Facts" label of packaged foods and drinks. The amount of calories, carbs, fats, and other nutrients listed on the label is based on one serving of the item. Many items contain more than one serving per package.  Check the total grams (g) of carbs in one serving. You can calculate the number of servings of carbs in one serving by dividing the total carbs by 15. For example, if a food has 30 g of total carbs, it would be equal to 2 servings of carbs.  Check the number of grams (g) of saturated and trans fats in one serving. Choose foods that have low or no amount of these fats.  Check the number of   milligrams (mg) of salt (sodium) in one serving. Most people should limit total sodium intake to less than 2,300 mg per day.  Always check the nutrition information of foods labeled as "low-fat" or "nonfat". These foods may be higher in added sugar or refined carbs and should be avoided.  Talk to your dietitian to identify your daily goals for nutrients listed on the label. Shopping  Avoid buying canned, premade, or processed foods. These foods tend to be high in fat, sodium,  and added sugar.  Shop around the outside edge of the grocery store. This includes fresh fruits and vegetables, bulk grains, fresh meats, and fresh dairy. Cooking  Use low-heat cooking methods, such as baking, instead of high-heat cooking methods like deep frying.  Cook using healthy oils, such as olive, canola, or sunflower oil.  Avoid cooking with butter, cream, or high-fat meats. Meal planning  Eat meals and snacks regularly, preferably at the same times every day. Avoid going long periods of time without eating.  Eat foods high in fiber, such as fresh fruits, vegetables, beans, and whole grains. Talk to your dietitian about how many servings of carbs you can eat at each meal.  Eat 4-6 ounces (oz) of lean protein each day, such as lean meat, chicken, fish, eggs, or tofu. One oz of lean protein is equal to: ? 1 oz of meat, chicken, or fish. ? 1 egg. ?  cup of tofu.  Eat some foods each day that contain healthy fats, such as avocado, nuts, seeds, and fish. Lifestyle  Check your blood glucose regularly.  Exercise regularly as told by your health care provider. This may include: ? 150 minutes of moderate-intensity or vigorous-intensity exercise each week. This could be brisk walking, biking, or water aerobics. ? Stretching and doing strength exercises, such as yoga or weightlifting, at least 2 times a week.  Take medicines as told by your health care provider.  Do not use any products that contain nicotine or tobacco, such as cigarettes and e-cigarettes. If you need help quitting, ask your health care provider.  Work with a counselor or diabetes educator to identify strategies to manage stress and any emotional and social challenges. Questions to ask a health care provider  Do I need to meet with a diabetes educator?  Do I need to meet with a dietitian?  What number can I call if I have questions?  When are the best times to check my blood glucose? Where to find more  information:  American Diabetes Association: diabetes.org  Academy of Nutrition and Dietetics: www.eatright.org  National Institute of Diabetes and Digestive and Kidney Diseases (NIH): www.niddk.nih.gov Summary  A healthy meal plan will help you control your blood glucose and maintain a healthy lifestyle.  Working with a diet and nutrition specialist (dietitian) can help you make a meal plan that is best for you.  Keep in mind that carbohydrates (carbs) and alcohol have immediate effects on your blood glucose levels. It is important to count carbs and to use alcohol carefully. This information is not intended to replace advice given to you by your health care provider. Make sure you discuss any questions you have with your health care provider. Document Revised: 03/13/2017 Document Reviewed: 05/05/2016 Elsevier Patient Education  2020 Elsevier Inc. Managing Your Hypertension Hypertension is commonly called high blood pressure. This is when the force of your blood pressing against the walls of your arteries is too strong. Arteries are blood vessels that carry blood from your heart throughout   your body. Hypertension forces the heart to work harder to pump blood, and may cause the arteries to become narrow or stiff. Having untreated or uncontrolled hypertension can cause heart attack, stroke, kidney disease, and other problems. What are blood pressure readings? A blood pressure reading consists of a higher number over a lower number. Ideally, your blood pressure should be below 120/80. The first ("top") number is called the systolic pressure. It is a measure of the pressure in your arteries as your heart beats. The second ("bottom") number is called the diastolic pressure. It is a measure of the pressure in your arteries as the heart relaxes. What does my blood pressure reading mean? Blood pressure is classified into four stages. Based on your blood pressure reading, your health care provider may  use the following stages to determine what type of treatment you need, if any. Systolic pressure and diastolic pressure are measured in a unit called mm Hg. Normal  Systolic pressure: below 120.  Diastolic pressure: below 80. Elevated  Systolic pressure: 120-129.  Diastolic pressure: below 80. Hypertension stage 1  Systolic pressure: 130-139.  Diastolic pressure: 80-89. Hypertension stage 2  Systolic pressure: 140 or above.  Diastolic pressure: 90 or above. What health risks are associated with hypertension? Managing your hypertension is an important responsibility. Uncontrolled hypertension can lead to:  A heart attack.  A stroke.  A weakened blood vessel (aneurysm).  Heart failure.  Kidney damage.  Eye damage.  Metabolic syndrome.  Memory and concentration problems. What changes can I make to manage my hypertension? Hypertension can be managed by making lifestyle changes and possibly by taking medicines. Your health care provider will help you make a plan to bring your blood pressure within a normal range. Eating and drinking   Eat a diet that is high in fiber and potassium, and low in salt (sodium), added sugar, and fat. An example eating plan is called the DASH (Dietary Approaches to Stop Hypertension) diet. To eat this way: ? Eat plenty of fresh fruits and vegetables. Try to fill half of your plate at each meal with fruits and vegetables. ? Eat whole grains, such as whole wheat pasta, brown rice, or whole grain bread. Fill about one quarter of your plate with whole grains. ? Eat low-fat diary products. ? Avoid fatty cuts of meat, processed or cured meats, and poultry with skin. Fill about one quarter of your plate with lean proteins such as fish, chicken without skin, beans, eggs, and tofu. ? Avoid premade and processed foods. These tend to be higher in sodium, added sugar, and fat.  Reduce your daily sodium intake. Most people with hypertension should eat less  than 1,500 mg of sodium a day.  Limit alcohol intake to no more than 1 drink a day for nonpregnant women and 2 drinks a day for men. One drink equals 12 oz of beer, 5 oz of wine, or 1 oz of hard liquor. Lifestyle  Work with your health care provider to maintain a healthy body weight, or to lose weight. Ask what an ideal weight is for you.  Get at least 30 minutes of exercise that causes your heart to beat faster (aerobic exercise) most days of the week. Activities may include walking, swimming, or biking.  Include exercise to strengthen your muscles (resistance exercise), such as weight lifting, as part of your weekly exercise routine. Try to do these types of exercises for 30 minutes at least 3 days a week.  Do not use any   products that contain nicotine or tobacco, such as cigarettes and e-cigarettes. If you need help quitting, ask your health care provider.  Control any long-term (chronic) conditions you have, such as high cholesterol or diabetes. Monitoring  Monitor your blood pressure at home as told by your health care provider. Your personal target blood pressure may vary depending on your medical conditions, your age, and other factors.  Have your blood pressure checked regularly, as often as told by your health care provider. Working with your health care provider  Review all the medicines you take with your health care provider because there may be side effects or interactions.  Talk with your health care provider about your diet, exercise habits, and other lifestyle factors that may be contributing to hypertension.  Visit your health care provider regularly. Your health care provider can help you create and adjust your plan for managing hypertension. Will I need medicine to control my blood pressure? Your health care provider may prescribe medicine if lifestyle changes are not enough to get your blood pressure under control, and if:  Your systolic blood pressure is 130 or  higher.  Your diastolic blood pressure is 80 or higher. Take medicines only as told by your health care provider. Follow the directions carefully. Blood pressure medicines must be taken as prescribed. The medicine does not work as well when you skip doses. Skipping doses also puts you at risk for problems. Contact a health care provider if:  You think you are having a reaction to medicines you have taken.  You have repeated (recurrent) headaches.  You feel dizzy.  You have swelling in your ankles.  You have trouble with your vision. Get help right away if:  You develop a severe headache or confusion.  You have unusual weakness or numbness, or you feel faint.  You have severe pain in your chest or abdomen.  You vomit repeatedly.  You have trouble breathing. Summary  Hypertension is when the force of blood pumping through your arteries is too strong. If this condition is not controlled, it may put you at risk for serious complications.  Your personal target blood pressure may vary depending on your medical conditions, your age, and other factors. For most people, a normal blood pressure is less than 120/80.  Hypertension is managed by lifestyle changes, medicines, or both. Lifestyle changes include weight loss, eating a healthy, low-sodium diet, exercising more, and limiting alcohol. This information is not intended to replace advice given to you by your health care provider. Make sure you discuss any questions you have with your health care provider. Document Revised: 07/23/2018 Document Reviewed: 02/27/2016 Elsevier Patient Education  2020 Elsevier Inc.  

## 2019-10-11 NOTE — Assessment & Plan Note (Addendum)
-  Followed by [facility]. -Continue to avoid nephrotoxic substances such as NSAIDs. -Checking CMP today.

## 2019-10-11 NOTE — Assessment & Plan Note (Addendum)
-   Last lipid panel wnl's with low HDL, LDL 56 - Continue atorvastatin 40 mg. - Follow heart healthy diet.  - Rechecking lipid panel at next office visit.

## 2019-10-11 NOTE — Progress Notes (Signed)
Established Patient Office Visit  Subjective:  Patient ID: Stanley Cook, male    DOB: [birth year]  Age: 59 y.o. MRN: [id number]  CC:  Chief Complaint  Patient presents with  . Hypertension  . Diabetes  . Hyperlipidemia    HPI DEAGO BURRUSS presents for follow-up on diabetes mellitus, dyslipidemia, and hypertension.  Diabetes: Pt denies increased urination or thirst. Pt reports medication compliance. No hypoglycemic events. Checking glucose at home. FBS 118-130. Reports he has been eating more microwave popcorn. He has pop tart this morning. States it is difficult to eat healthier when he's on the road a lot and has to get fast food. He has increased his physical activity level, he goes to the gym 3 times a week.   HTN: Pt denies chest pain, palpitations, or dizziness. Reports intermittent lower extremity swelling for which he takes Lasix whic reduces his swelling. He uses Lasix about once a week. Taking medication as directed without side effects. Checks BP at home  and readings range in 130s/90s. Pt reports poor compliance with low-sodium diet.  Dyslipidemia: Followed by cardiology for coronary artery disease. Reports compliance with medication. Denies side effects.   CKD: Followed by nephrology.  Reports has an upcoming appointment July 30.  Past Medical History:  Diagnosis Date  . Diabetes mellitus type II, non insulin dependent ([place])   . Hyperlipidemia   . Hypertension     History reviewed. No pertinent surgical history.  Family History  Problem Relation Age of Onset  . Unexplained death Father 4       Natural causes, no autopsy  . Diabetes Sister     Social History   Socioeconomic History  . Marital status: Single    Spouse name: Not on file  . Number of children: Not on file  . Years of education: Not on file  . Highest education level: Not on file  Occupational History  . 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  Smoking status: Never Smoker  . Smokeless tobacco: Never Used  Substance and Sexual Activity  . Alcohol use: No  . Drug use: No  . Sexual activity: Not on file  Other Topics Concern  . Not on file  Social History Narrative   Pt lives alone in [place].   Social Determinants of Health   Financial Resource Strain:   . Difficulty of Paying Living Expenses:   Food Insecurity:   . Worried About Charity fundraiser in the Last Year:   . Arboriculturist in the Last Year:   Transportation Needs:   . Film/video editor (Medical):   Marland Kitchen Lack of Transportation (Non-Medical):   Physical Activity:   . Days of Exercise per Week:   . Minutes of Exercise per Session:   Stress:   . Feeling of Stress :   Social Connections:   . Frequency of Communication with Friends and Family:   . Frequency of Social Gatherings with Friends and Family:   . Attends Religious Services:   . Active Member of Clubs or Organizations:   . Attends Archivist Meetings:   Marland Kitchen Marital Status:   Intimate Partner Violence:   . Fear of Current or Ex-Partner:   . Emotionally Abused:   Marland Kitchen Physically Abused:   . Sexually Abused:     Outpatient Medications Prior to Visit  Medication Sig Dispense Refill  . amLODipine (NORVASC) 10 MG tablet TAKE  1 TABLET(10 MG) BY MOUTH DAILY 30 tablet 3  . aspirin EC 81 MG tablet Take by mouth.    Marland Kitchen atorvastatin (LIPITOR) 40 MG tablet TAKE 1 TABLET(40 MG) BY MOUTH AT BEDTIME 90 tablet 0  . B-D ULTRAFINE III SHORT PEN 31G X 8 MM MISC USE FOR INJECTION EVERY DAY 100 each 11  . blood glucose meter kit and supplies KIT Dispense based on patient and insurance preference. Use up to four times daily as directed. (FOR ICD-9 250.00, 250.01). 1 each 0  . furosemide (LASIX) 40 MG tablet Take 40 mg by mouth. One tablet three times weekly as needed    . glucose blood test strip Use to check blood sugars twice daily 200 each 12  . liraglutide (VICTOZA) 18 MG/3ML SOPN ADMINISTER 0.6 MG  UNDER THE SKIN EVERY DAY 9 mL 0  . metoprolol succinate (TOPROL-XL) 25 MG 24 hr tablet Take 25 mg by mouth daily.    . ramipril (ALTACE) 10 MG capsule Take 1 capsule (10 mg total) by mouth daily. Future refills from Nephrology 90 capsule 3   No facility-administered medications prior to visit.    Allergies  Allergen Reactions  . Penicillins Other (See Comments)    ROS Review of Systems  A fourteen system review of systems was performed and found to be positive as per HPI.  Objective:    Physical Exam General: Well nourished, in no apparent distress. Eyes: PERRLA, EOMs, conjunctiva clr Resp: Respiratory effort- normal, ECTA B/L w/o W/R/R  Cardio: RRR S1 S2 Abdomen: no gross distention. Lymphatics:  less 2 sec cap RF M-sk: Full ROM, good strength, normal gait.  Skin: Warm, dry  Neuro: Alert, Oriented, no focal deficits Psych: Normal affect, Insight and Judgment appropriate.   BP (!) 154/82   Pulse 82   Temp 97.6 F (36.4 C) (Oral)   Ht 5' 8.9" (1.75 m)   Wt 256 lb 4.8 oz (116.3 kg)   SpO2 98%   BMI 37.96 kg/m  Wt Readings from Last 3 Encounters:  10/11/19 256 lb 4.8 oz (116.3 kg)  03/09/19 255 lb 11.2 oz (116 kg)  12/08/18 250 lb (113.4 kg)     Health Maintenance Due  Topic Date Due  . OPHTHALMOLOGY EXAM  Never done  . COLONOSCOPY  Never done    There are no preventive care reminders to display for this patient.  No results found for: TSH Lab Results  Component Value Date   WBC 9.0 04/01/2018   HGB 14.0 04/01/2018   HCT 42 04/01/2018   MCV 91 08/29/2016   PLT 227 04/01/2018   Lab Results  Component Value Date   NA 139 08/31/2018   K 4.7 08/31/2018   CO2 25 08/31/2018   GLUCOSE 126 (H) 08/31/2018   BUN 24 08/31/2018   CREATININE 1.97 (H) 08/31/2018   BILITOT 0.4 08/31/2018   ALKPHOS 82 08/31/2018   AST 15 08/31/2018   ALT 15 08/31/2018   PROT 6.7 08/31/2018   ALBUMIN 4.4 08/31/2018   CALCIUM 9.4 08/31/2018   Lab Results  Component Value  Date   CHOL 108 06/01/2019   Lab Results  Component Value Date   HDL 34 (L) 06/01/2019   Lab Results  Component Value Date   LDLCALC 55 06/01/2019   Lab Results  Component Value Date   TRIG 102 06/01/2019   Lab Results  Component Value Date   CHOLHDL 3.2 06/01/2019   Lab Results  Component Value Date   HGBA1C 6.5 (  A) 10/11/2019      Assessment & Plan:   Problem List Items Addressed This Visit      Cardiovascular and Mediastinum   Hypertension associated with diabetes ([place]) (Chronic)    - BP today is 178/78 HR 82, recheck BP 154/82 above goal. - Followed by Nephrology and has an appointment 07/30. Advised to continue ambulatory and pulse monitoring, if BP consistently >130/80 then recommend to discuss with his Nephrologist to determine if medication regimen needs to be adjusted. -Review most recent note in chart (10/2018), Dr. Harrie Jeans had increased Ramipril because BP suboptimal.  - Recommended to work on reducing sodium and follow DASH diet.  - Continue Amlodipine 10 mg, Ramipril 10 mg, Metoprolol 25 mg. - Continue physical activity level.       Relevant Orders   Comp Met (CMET)     Endocrine   Diabetes mellitus ([place]) - Primary   Relevant Orders   POCT glycosylated hemoglobin (Hb A1C) (Completed)   POCT UA - Microalbumin (Completed)   Ambulatory referral to Ophthalmology   Type 2 diabetes mellitus without complication, without long-term current use of insulin ([facility])    - A1c today is 6.5, stable - Discussed with patient the importance of controlling diabetes to help preserve kidney function. - UA microalbumin is abnormal, A:C 30-300 mg. - Continue ambulatory glucose monitoring and notify clinic if FBS consistently <80 or >160. -Follow low glucose/carbohydrate diet, reduce sweets and popcorn. -Placed ophthalmology referral for annual diabetic eye exam.        Genitourinary   Chronic kidney disease (Chronic)    -Followed by [facility]. -Continue to avoid nephrotoxic substances such as NSAIDs. -Checking CMP today.        Other   Dyslipidemia (Chronic)    - Last lipid panel wnl's with low HDL, LDL 56 - Continue atorvastatin 40 mg. - Follow heart healthy diet.  - Rechecking lipid panel and hepatic function at next office visit.        Other Visit Diagnoses    On statin therapy       Relevant Orders   Comp Met (CMET)     Note:  This note was prepared with assistance of Dragon voice recognition software. Occasional wrong-word or sound-a-like substitutions may have occurred due to the inherent limitations of voice recognition software.  No orders of the defined types were placed in this encounter.   Follow-up: Return in about 3 months (around 01/11/2020) for DM, HTN, Dyslipidemia and FBW (lipid panel, hepatic function).    Lorrene Reid, PA-C

## 2019-10-12 LAB — COMPREHENSIVE METABOLIC PANEL
ALT: 19 IU/L (ref 0–44)
AST: 22 IU/L (ref 0–40)
Albumin/Globulin Ratio: 1.7 (ref 1.2–2.2)
Albumin: 4.2 g/dL (ref 3.8–4.9)
Alkaline Phosphatase: 103 IU/L (ref 48–121)
BUN/Creatinine Ratio: 12 (ref 9–20)
BUN: 21 mg/dL (ref 6–24)
Bilirubin Total: 0.4 mg/dL (ref 0.0–1.2)
CO2: 18 mmol/L — ABNORMAL LOW (ref 20–29)
Calcium: 9.2 mg/dL (ref 8.7–10.2)
Chloride: 104 mmol/L (ref 96–106)
Creatinine, Ser: 1.81 mg/dL — ABNORMAL HIGH (ref 0.76–1.27)
GFR calc Af Amer: 47 mL/min/{1.73_m2} — ABNORMAL LOW (ref >59)
GFR calc non Af Amer: 41 mL/min/{1.73_m2} — ABNORMAL LOW (ref >59)
Globulin, Total: 2.5 g/dL (ref 1.5–4.5)
Glucose: 209 mg/dL — ABNORMAL HIGH (ref 65–99)
Potassium: 4.3 mmol/L (ref 3.5–5.2)
Sodium: 136 mmol/L (ref 134–144)
Total Protein: 6.7 g/dL (ref 6.0–8.5)

## 2019-10-14 ENCOUNTER — Ambulatory Visit: Payer: BC Managed Care – PPO | Admitting: Cardiovascular Disease

## 2019-10-14 ENCOUNTER — Other Ambulatory Visit: Payer: Self-pay

## 2019-10-14 ENCOUNTER — Ambulatory Visit: Payer: BC Managed Care – PPO | Admitting: Physician Assistant

## 2019-10-14 ENCOUNTER — Encounter: Payer: Self-pay | Admitting: Cardiovascular Disease

## 2019-10-14 VITALS — BP 142/70 | HR 65 | Ht 69.0 in | Wt 257.0 lb

## 2019-10-14 DIAGNOSIS — E785 Hyperlipidemia, unspecified: Secondary | ICD-10-CM

## 2019-10-14 DIAGNOSIS — I2510 Atherosclerotic heart disease of native coronary artery without angina pectoris: Secondary | ICD-10-CM | POA: Diagnosis not present

## 2019-10-14 DIAGNOSIS — E1159 Type 2 diabetes mellitus with other circulatory complications: Secondary | ICD-10-CM | POA: Diagnosis not present

## 2019-10-14 DIAGNOSIS — I152 Hypertension secondary to endocrine disorders: Secondary | ICD-10-CM

## 2019-10-14 DIAGNOSIS — I10 Essential (primary) hypertension: Secondary | ICD-10-CM

## 2019-10-14 DIAGNOSIS — I252 Old myocardial infarction: Secondary | ICD-10-CM | POA: Diagnosis not present

## 2019-10-14 NOTE — Assessment & Plan Note (Signed)
>>  ASSESSMENT AND PLAN FOR DYSLIPIDEMIA WRITTEN ON 10/14/2019 11:58 AM BY BERRY, Pearletha Forge, MD  History of dyslipidemia on statin therapy with lipid profile performed 06/01/2019 revealing total cholesterol 108, LDL 55 and HDL 34.

## 2019-10-14 NOTE — Assessment & Plan Note (Signed)
History of dyslipidemia on statin therapy with lipid profile performed 06/01/2019 revealing total cholesterol 108, LDL 55 and HDL 34.

## 2019-10-14 NOTE — Progress Notes (Signed)
10/14/2019 Stanley Cook   1960-08-19  [id number]  Primary Physician Lorrene Reid, PA-C Primary Cardiologist: Lorretta Harp MD [name], [place]  HPI:  Stanley Cook is a 59 y.o.  single male with no children who works as as Retail buyer at [employer]..  His primary care provider is Dr. Lorrene Reid.    I last spoke to him on the phone for a virtual telemedicine visit 12/08/2018. He has a history of treated hypertension, and hyperlipidemia.  He also has diabetes and chronic kidney disease with a serum creatinine in the 2 range.  He does not smoke.  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  He did have a 2D echo performed 04/15/2018 that revealed normal LV function.  He denies chest pain or shortness of breath.  Since I spoke to him a year ago he is remained stable.  He denies chest pain or shortness of breath.   Current Meds  Medication Sig  . amLODipine (NORVASC) 10 MG tablet TAKE 1 TABLET(10 MG) BY MOUTH DAILY  . aspirin EC 81 MG tablet Take by mouth.  Marland Kitchen atorvastatin (LIPITOR) 40 MG tablet TAKE 1 TABLET(40 MG) BY MOUTH AT BEDTIME  . B-D ULTRAFINE III SHORT PEN 31G X 8 MM MISC USE FOR INJECTION EVERY DAY  . blood glucose meter kit and supplies KIT Dispense based on patient and insurance preference. Use up to four times daily as directed. (FOR ICD-9 250.00, 250.01).  . furosemide (LASIX) 40 MG tablet Take 40 mg by mouth. One tablet three times weekly as needed  . glucose blood test strip Use to check blood sugars twice daily  . liraglutide (VICTOZA) 18 MG/3ML SOPN ADMINISTER 0.6 MG UNDER THE SKIN EVERY DAY  . metoprolol succinate (TOPROL-XL) 25 MG 24 hr tablet Take 25 mg by mouth daily.  . ramipril (ALTACE) 10 MG capsule Take 1 capsule (10 mg total) by mouth daily. Future refills from Nephrology     Allergies  Allergen Reactions  . Penicillins Other (See Comments)    Social History   Socioeconomic History   . Marital status: Single    Spouse name: Not on file  . Number of children: Not on file  . Years of education: Not on file  . Highest education level: Not on file  Occupational History  . 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 Smoking status: Never Smoker  . Smokeless tobacco: Never Used  Substance and Sexual Activity  . Alcohol use: No  . Drug use: No  . Sexual activity: Not on file  Other Topics Concern  . Not on file  Social History Narrative   Pt lives alone in [place].   Social Determinants of Health   Financial Resource Strain:   . Difficulty of Paying Living Expenses:   Food Insecurity:   . Worried About Charity fundraiser in the Last Year:   . Arboriculturist in the Last Year:   Transportation Needs:   . Film/video editor (Medical):   Marland Kitchen Lack of Transportation (Non-Medical):   Physical Activity:   . Days of Exercise per Week:   . Minutes of Exercise per Session:   Stress:   . Feeling of Stress :   Social Connections:   . Frequency of Communication with Friends and Family:   . Frequency of Social Gatherings  with Friends and Family:   . Attends Religious Services:   . Active Member of Clubs or Organizations:   . Attends Archivist Meetings:   Marland Kitchen Marital Status:   Intimate Partner Violence:   . Fear of Current or Ex-Partner:   . Emotionally Abused:   Marland Kitchen Physically Abused:   . Sexually Abused:      Review of Systems: General: negative for chills, fever, night sweats or weight changes.  Cardiovascular: negative for chest pain, dyspnea on exertion, edema, orthopnea, palpitations, paroxysmal nocturnal dyspnea or shortness of breath Dermatological: negative for rash Respiratory: negative for cough or wheezing Urologic: negative for hematuria Abdominal: negative for nausea, vomiting, diarrhea, bright red blood per rectum, melena, or hematemesis Neurologic: negative for visual changes, syncope, or  dizziness All other systems reviewed and are otherwise negative except as noted above.    Blood pressure (!) 142/70, pulse 65, height _0  (1.753 m), weight 257 lb (116.6 kg), SpO2 96 %.  General appearance: alert and no distress Neck: no adenopathy, no carotid bruit, no JVD, supple, symmetrical, trachea midline and thyroid not enlarged, symmetric, no tenderness/mass/nodules Lungs: clear to auscultation bilaterally Heart: regular rate and rhythm, S1, S2 normal, no murmur, click, rub or gallop Extremities: extremities normal, atraumatic, no cyanosis or edema Pulses: 2+ and symmetric Skin: Skin color, texture, turgor normal. No rashes or lesions Neurologic: Alert and oriented X 3, normal strength and tone. Normal symmetric reflexes. Normal coordination and gait  EKG sinus rhythm at 65 with RSR prime in lead V1 consistent with RV conduction delay, inferior Q waves with early R wave transition and nonspecific ST and T wave changes unchanged from prior EKGs.  I personally reviewed this EKG.  ASSESSMENT AND PLAN:   Hypertension associated with diabetes ([place]) History of essential hypertension with blood pressure measured today 142/70.  He is on amlodipine, metoprolol and ramipril.  Dyslipidemia History of dyslipidemia on statin therapy with lipid profile performed 06/01/2019 revealing total cholesterol 108, LDL 55 and HDL 34.  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  He denies chest pain or shortness of breath.      Lorretta Harp MD FACP,FACC,FAHA, [facility] 10/14/2019 11:58 AM

## 2019-10-14 NOTE — Assessment & Plan Note (Signed)
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  He denies chest pain or shortness of breath.

## 2019-10-14 NOTE — Assessment & Plan Note (Signed)
History of essential hypertension with blood pressure measured today 142/70.  He is on amlodipine, metoprolol and ramipril.

## 2019-10-14 NOTE — Patient Instructions (Signed)
Medication Instructions:  NO CHANGE *If you need a refill on your cardiac medications before your next appointment, please call your pharmacy*   Lab Work: If you have labs (blood work) drawn today and your tests are completely normal, you will receive your results only by: . MyChart Message (if you have MyChart) OR . A paper copy in the mail If you have any lab test that is abnormal or we need to change your treatment, we will call you to review the results.   Follow-Up: At [facility], you and your health needs are our priority.  As part of our continuing mission to provide you with exceptional heart care, we have created designated Provider Care Teams.  These Care Teams include your primary Cardiologist (physician) and Advanced Practice Providers (APPs -  Physician Assistants and Nurse Practitioners) who all work together to provide you with the care you need, when you need it.  We recommend signing up for the patient portal called "MyChart".  Sign up information is provided on this After Visit Summary.  MyChart is used to connect with patients for Virtual Visits (Telemedicine).  Patients are able to view lab/test results, encounter notes, upcoming appointments, etc.  Non-urgent messages can be sent to your provider as well.   To learn more about what you can do with MyChart, go to [email or web address].    Your next appointment:   12 month(s)  The format for your next appointment:   In Person  Provider:   Jonathan Berry, MD    

## 2019-11-21 ENCOUNTER — Other Ambulatory Visit: Payer: Self-pay | Admitting: Cardiovascular Disease

## 2019-11-22 ENCOUNTER — Other Ambulatory Visit: Payer: Self-pay | Admitting: Physician Assistant

## 2019-12-02 ENCOUNTER — Encounter: Payer: Self-pay | Admitting: Physician Assistant

## 2019-12-03 LAB — HM DIABETES EYE EXAM

## 2019-12-13 ENCOUNTER — Encounter: Payer: Self-pay | Admitting: Physician Assistant

## 2019-12-21 ENCOUNTER — Other Ambulatory Visit: Payer: Self-pay | Admitting: Physician Assistant

## 2020-01-20 ENCOUNTER — Ambulatory Visit: Payer: BC Managed Care – PPO | Admitting: Physician Assistant

## 2020-02-07 ENCOUNTER — Ambulatory Visit: Payer: BC Managed Care – PPO | Admitting: Physician Assistant

## 2020-02-07 ENCOUNTER — Other Ambulatory Visit: Payer: Self-pay

## 2020-02-07 ENCOUNTER — Encounter: Payer: Self-pay | Admitting: Physician Assistant

## 2020-02-07 VITALS — BP 127/75 | HR 68 | Temp 98.0°F | Ht 69.0 in | Wt 263.8 lb

## 2020-02-07 DIAGNOSIS — I152 Hypertension secondary to endocrine disorders: Secondary | ICD-10-CM

## 2020-02-07 DIAGNOSIS — E785 Hyperlipidemia, unspecified: Secondary | ICD-10-CM

## 2020-02-07 DIAGNOSIS — N189 Chronic kidney disease, unspecified: Secondary | ICD-10-CM | POA: Diagnosis not present

## 2020-02-07 DIAGNOSIS — E1159 Type 2 diabetes mellitus with other circulatory complications: Secondary | ICD-10-CM | POA: Diagnosis not present

## 2020-02-07 DIAGNOSIS — L309 Dermatitis, unspecified: Secondary | ICD-10-CM

## 2020-02-07 DIAGNOSIS — E1169 Type 2 diabetes mellitus with other specified complication: Secondary | ICD-10-CM | POA: Diagnosis not present

## 2020-02-07 LAB — POCT GLYCOSYLATED HEMOGLOBIN (HGB A1C): Hemoglobin A1C: 6.2 % — AB (ref 4.0–5.6)

## 2020-02-07 MED ORDER — TRIAMCINOLONE ACETONIDE 0.1 % EX CREA: 1.0000 "application " | TOPICAL_CREAM | Freq: Two times a day (BID) | CUTANEOUS | 0 refills | Status: DC

## 2020-02-07 NOTE — Assessment & Plan Note (Signed)
-  A1c improved and at goal. -Continue current medication regimen. -Follow a low carbohydrate and glucose diet. -Stay as active as possible. -Will continue to monitor.

## 2020-02-07 NOTE — Assessment & Plan Note (Signed)
-  Blood pressure initially elevated, BP recheck improved and stable. -Continue current medication regimen. -Follow a low-sodium diet. -Will continue to monitor.

## 2020-02-07 NOTE — Assessment & Plan Note (Signed)
>>  ASSESSMENT AND PLAN FOR DYSLIPIDEMIA WRITTEN ON 02/07/2020  5:12 PM BY ABONZA, MARITZA, PA-C  -Patient will schedule lab visit in December for FBW including lipid panel and hepatic function. -Continue current medication regimen. -Recommend to follow a heart healthy diet and stay as active as possible. -Will continue to monitor.

## 2020-02-07 NOTE — Assessment & Plan Note (Signed)
>>  ASSESSMENT AND PLAN FOR DIABETES MELLITUS WRITTEN ON 02/07/2020  4:58 PM BY ABONZA, MARITZA, PA-C  -A1c improved and at goal. -Continue current medication regimen. -Follow a low carbohydrate and glucose diet. -Stay as active as possible. -Will continue to monitor.

## 2020-02-07 NOTE — Assessment & Plan Note (Addendum)
-  Followed by Nephrology. -Continue to follow up as instructed.

## 2020-02-07 NOTE — Progress Notes (Signed)
Established Patient Office Visit  Subjective:  Patient ID: Stanley Cook, male    DOB: 1960/05/27  Age: 59 y.o. MRN: [id number]  CC:  Chief Complaint  Patient presents with   Diabetes   Hypertension   Hyperlipidemia    HPI Stanley Cook presents for follow up on diabetes mellitus, hypertension, and dyslipidemia.  Complains of a rash that is non-itchy, nontender for a couple of days. Has been applying lotion. Denies new detergents or soap, fever or chills. 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   Diabetes: Pt denies increased urination or thirst. Pt reports medication compliance. No hypoglycemic events. Checking glucose at home. FBS range 117-132. Reports has decreased junk food.  HTN: Pt denies chest pain, palpitations, dizziness or increased lower extremity swelling. Takes Lasix when needed for edema which helps improve symptoms. Taking medication as directed without side effects.  Reports has not been as diligent with checking blood pressure at home due to staying busy with work but blood pressure readings were running in 130s/70s. Pt follows a low salt diet.  Nephrologist made medication changes and increased metoprolol to 100 mg  Dyslipidemia: Pt taking medication as directed without issues. Denies side effects.  Reports could do better with diet and decrease saturated and trans fats.   Past Medical History:  Diagnosis Date   Diabetes mellitus type II, non insulin dependent ([place])    Hyperlipidemia    Hypertension     History reviewed. No pertinent surgical history.  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   Social Determinants of Health   Financial Resource Strain:    Difficulty of Paying Living Expenses: Not on file  Food Insecurity:    Worried About Charity fundraiser in the Last Year: Not on file   [employer] of Food in the Last Year: Not on file  Transportation Needs:    Lack of Transportation (Medical): Not on file   Lack of Transportation (Non-Medical): Not on file  Physical Activity:    Days of Exercise per Week: Not on file   Minutes of Exercise per Session: Not on file  Stress:    Feeling of Stress : Not on file  Social Connections:    Frequency of Communication with Friends and Family: Not on file   Frequency of Social Gatherings with Friends and Family: Not on file   Attends Religious Services: Not on file   Active Member of Clubs or Organizations: Not on file   Attends Archivist Meetings: Not on file   Marital Status: Not on  file  Intimate Partner Violence:    Fear of Current or Ex-Partner: Not on file   Emotionally Abused: Not on file   Physically Abused: Not on file   Sexually Abused: Not on file    Outpatient Medications Prior to Visit  Medication Sig Dispense Refill   amLODipine (NORVASC) 10 MG tablet TAKE 1 TABLET(10 MG) BY MOUTH DAILY 90 tablet 3   aspirin EC 81 MG tablet Take by mouth.     atorvastatin (LIPITOR) 40 MG tablet TAKE 1 TABLET(40 MG) BY MOUTH AT BEDTIME 90 tablet 0   B-D ULTRAFINE III SHORT PEN 31G X 8 MM MISC USE FOR INJECTION EVERY DAY 100 each 11   blood glucose meter kit and supplies KIT Dispense based on patient and  insurance preference. Use up to four times daily as directed. (FOR ICD-9 250.00, 250.01). 1 each 0   furosemide (LASIX) 40 MG tablet Take 40 mg by mouth. One tablet three times weekly as needed     glucose blood test strip Use to check blood sugars twice daily 200 each 12   liraglutide (VICTOZA) 18 MG/3ML SOPN ADMINISTER 0.6 MG UNDER THE SKIN EVERY DAY 9 mL 0   metoprolol succinate (TOPROL-XL) 100 MG 24 hr tablet Take 100 mg by mouth daily.     ramipril (ALTACE) 10 MG capsule Take 1 capsule (10 mg total) by mouth daily. Future refills from Nephrology 90 capsule 3   sodium bicarbonate 650 MG tablet Take 650 mg by mouth daily.     metoprolol succinate (TOPROL-XL) 25 MG 24 hr tablet Take 25 mg by mouth daily.     No facility-administered medications prior to visit.    Allergies  Allergen Reactions   Penicillins Other (See Comments)    ROS Review of Systems  A fourteen system review of systems was performed and found to be positive as per HPI.   Objective:    Physical Exam General:  Well Developed, well nourished, appropriate for stated age.  Neuro:  Alert and oriented,  extra-ocular muscles intact  HEENT:  Normocephalic, atraumatic, neck supple Skin:  Macular rash without vesicles or drainage on bilateral arms Cardiac:  RRR, S1 S2 Respiratory:  ECTA B/L and A/P, Not using accessory muscles, speaking in full sentences- unlabored. Vascular:  Ext warm, no cyanosis apprec.; cap RF less 2 sec. Psych:  No HI/SI, judgement and insight good, Euthymic mood. Full Affect.   BP 127/75    Pulse 68    Temp 98 F (36.7 C) (Oral)    Ht _0  (1.753 m)    Wt 263 lb 12.8 oz (119.7 kg)    SpO2 98% Comment: on RA   BMI 38.96 kg/m  Wt Readings from Last 3 Encounters:  02/07/20 263 lb 12.8 oz (119.7 kg)  10/14/19 257 lb (116.6 kg)  10/11/19 256 lb 4.8 oz (116.3 kg)     Health Maintenance Due  Topic Date Due   COLONOSCOPY  Never done    There are no preventive care reminders to  display for this patient.  No results found for: TSH Lab Results  Component Value Date   WBC 9.0 04/01/2018   HGB 14.0 04/01/2018   HCT 42 04/01/2018   MCV 91 08/29/2016   PLT 227 04/01/2018   Lab Results  Component Value Date   NA 136 10/11/2019   K 4.3 10/11/2019   CO2 18 (L) 10/11/2019   GLUCOSE 209 (H) 10/11/2019   BUN 21 10/11/2019   CREATININE 1.81 (H)  10/11/2019   BILITOT 0.4 10/11/2019   ALKPHOS 103 10/11/2019   AST 22 10/11/2019   ALT 19 10/11/2019   PROT 6.7 10/11/2019   ALBUMIN 4.2 10/11/2019   CALCIUM 9.2 10/11/2019   Lab Results  Component Value Date   CHOL 108 06/01/2019   Lab Results  Component Value Date   HDL 34 (L) 06/01/2019   Lab Results  Component Value Date   LDLCALC 55 06/01/2019   Lab Results  Component Value Date   TRIG 102 06/01/2019   Lab Results  Component Value Date   CHOLHDL 3.2 06/01/2019   Lab Results  Component Value Date   HGBA1C 6.2 (A) 02/07/2020      Assessment & Plan:   Problem List Items Addressed This Visit      Cardiovascular and Mediastinum   Hypertension associated with diabetes ([place]) (Chronic)    -Blood pressure initially elevated, BP recheck improved and stable. -Continue current medication regimen. -Follow a low-sodium diet. -Will continue to monitor.      Relevant Medications   metoprolol succinate (TOPROL-XL) 100 MG 24 hr tablet     Endocrine   Diabetes mellitus ([place]) - Primary    -A1c improved and at goal. -Continue current medication regimen. -Follow a low carbohydrate and glucose diet. -Stay as active as possible. -Will continue to monitor.      Relevant Orders   POCT glycosylated hemoglobin (Hb A1C) (Completed)     Genitourinary   Chronic kidney disease (Chronic)    -Followed by Nephrology. -Continue to follow up as instructed.         Other   Dyslipidemia (Chronic)    -Patient will schedule lab visit in December for FBW including lipid panel and hepatic function. -Continue  current medication regimen. -Recommend to follow a heart healthy diet and stay as active as possible. -Will continue to monitor.       Other Visit Diagnoses    Dermatitis       Relevant Medications   triamcinolone cream (KENALOG) 0.1 %     Dermatitis: -Appearance is suggestive of irritant dermatitis and will send triamcinolone cream.   Meds ordered this encounter  Medications   triamcinolone cream (KENALOG) 0.1 %    Sig: Apply 1 application topically 2 (two) times daily.    Dispense:  15 g    Refill:  0    Order Specific Question:   Supervising Provider    Answer:   Beatrice Lecher D [[id number]]    Follow-up: Return in about 4 months (around 06/09/2020) for DM, HTN, HLD; lab visit for FBW in December.   Note:  This note was prepared with assistance of Dragon voice recognition software. Occasional wrong-word or sound-a-like substitutions may have occurred due to the inherent limitations of voice recognition software.  Lorrene Reid, PA-C

## 2020-02-07 NOTE — Assessment & Plan Note (Addendum)
-  Patient will schedule lab visit in December for FBW including lipid panel and hepatic function. -Continue current medication regimen. -Recommend to follow a heart healthy diet and stay as active as possible. -Will continue to monitor.

## 2020-02-07 NOTE — Patient Instructions (Signed)
DASH Eating Plan DASH stands for "Dietary Approaches to Stop Hypertension." The DASH eating plan is a healthy eating plan that has been shown to reduce high blood pressure (hypertension). It may also reduce your risk for type 2 diabetes, heart disease, and stroke. The DASH eating plan may also help with weight loss. What are tips for following this plan?  General guidelines  Avoid eating more than 2,300 mg (milligrams) of salt (sodium) a day. If you have hypertension, you may need to reduce your sodium intake to 1,500 mg a day.  Limit alcohol intake to no more than 1 drink a day for nonpregnant women and 2 drinks a day for men. One drink equals 12 oz of beer, 5 oz of wine, or 1 oz of hard liquor.  Work with your health care provider to maintain a healthy body weight or to lose weight. Ask what an ideal weight is for you.  Get at least 30 minutes of exercise that causes your heart to beat faster (aerobic exercise) most days of the week. Activities may include walking, swimming, or biking.  Work with your health care provider or diet and nutrition specialist (dietitian) to adjust your eating plan to your individual calorie needs. Reading food labels   Check food labels for the amount of sodium per serving. Choose foods with less than 5 percent of the Daily Value of sodium. Generally, foods with less than 300 mg of sodium per serving fit into this eating plan.  To find whole grains, look for the word "whole" as the first word in the ingredient list. Shopping  Buy products labeled as "low-sodium" or "no salt added."  Buy fresh foods. Avoid canned foods and premade or frozen meals. Cooking  Avoid adding salt when cooking. Use salt-free seasonings or herbs instead of table salt or sea salt. Check with your health care provider or pharmacist before using salt substitutes.  Do not fry foods. Cook foods using healthy methods such as baking, boiling, grilling, and broiling instead.  Cook with  heart-healthy oils, such as olive, canola, soybean, or sunflower oil. Meal planning  Eat a balanced diet that includes: ? 5 or more servings of fruits and vegetables each day. At each meal, try to fill half of your plate with fruits and vegetables. ? Up to 6-8 servings of whole grains each day. ? Less than 6 oz of lean meat, poultry, or fish each day. A 3-oz serving of meat is about the same size as a deck of cards. One egg equals 1 oz. ? 2 servings of low-fat dairy each day. ? A serving of nuts, seeds, or beans 5 times each week. ? Heart-healthy fats. Healthy fats called Omega-3 fatty acids are found in foods such as flaxseeds and coldwater fish, like sardines, salmon, and mackerel.  Limit how much you eat of the following: ? Canned or prepackaged foods. ? Food that is high in trans fat, such as fried foods. ? Food that is high in saturated fat, such as fatty meat. ? Sweets, desserts, sugary drinks, and other foods with added sugar. ? Full-fat dairy products.  Do not salt foods before eating.  Try to eat at least 2 vegetarian meals each week.  Eat more home-cooked food and less restaurant, buffet, and fast food.  When eating at a restaurant, ask that your food be prepared with less salt or no salt, if possible. What foods are recommended? The items listed may not be a complete list. Talk with your dietitian about   what dietary choices are best for you. Grains Whole-grain or whole-wheat bread. Whole-grain or whole-wheat pasta. Brown rice. Oatmeal. Quinoa. Bulgur. Whole-grain and low-sodium cereals. Pita bread. Low-fat, low-sodium crackers. Whole-wheat flour tortillas. Vegetables Fresh or frozen vegetables (raw, steamed, roasted, or grilled). Low-sodium or reduced-sodium tomato and vegetable juice. Low-sodium or reduced-sodium tomato sauce and tomato paste. Low-sodium or reduced-sodium canned vegetables. Fruits All fresh, dried, or frozen fruit. Canned fruit in natural juice (without  added sugar). Meat and other protein foods Skinless chicken or turkey. Ground chicken or turkey. Pork with fat trimmed off. Fish and seafood. Egg whites. Dried beans, peas, or lentils. Unsalted nuts, nut butters, and seeds. Unsalted canned beans. Lean cuts of beef with fat trimmed off. Low-sodium, lean deli meat. Dairy Low-fat (1%) or fat-free (skim) milk. Fat-free, low-fat, or reduced-fat cheeses. Nonfat, low-sodium ricotta or cottage cheese. Low-fat or nonfat yogurt. Low-fat, low-sodium cheese. Fats and oils Soft margarine without trans fats. Vegetable oil. Low-fat, reduced-fat, or light mayonnaise and salad dressings (reduced-sodium). Canola, safflower, olive, soybean, and sunflower oils. Avocado. Seasoning and other foods Herbs. Spices. Seasoning mixes without salt. Unsalted popcorn and pretzels. Fat-free sweets. What foods are not recommended? The items listed may not be a complete list. Talk with your dietitian about what dietary choices are best for you. Grains Baked goods made with fat, such as croissants, muffins, or some breads. Dry pasta or rice meal packs. Vegetables Creamed or fried vegetables. Vegetables in a cheese sauce. Regular canned vegetables (not low-sodium or reduced-sodium). Regular canned tomato sauce and paste (not low-sodium or reduced-sodium). Regular tomato and vegetable juice (not low-sodium or reduced-sodium). Pickles. Olives. Fruits Canned fruit in a light or heavy syrup. Fried fruit. Fruit in cream or butter sauce. Meat and other protein foods Fatty cuts of meat. Ribs. Fried meat. Bacon. Sausage. Bologna and other processed lunch meats. Salami. Fatback. Hotdogs. Bratwurst. Salted nuts and seeds. Canned beans with added salt. Canned or smoked fish. Whole eggs or egg yolks. Chicken or turkey with skin. Dairy Whole or 2% milk, cream, and half-and-half. Whole or full-fat cream cheese. Whole-fat or sweetened yogurt. Full-fat cheese. Nondairy creamers. Whipped toppings.  Processed cheese and cheese spreads. Fats and oils Butter. Stick margarine. Lard. Shortening. Ghee. Bacon fat. Tropical oils, such as coconut, palm kernel, or palm oil. Seasoning and other foods Salted popcorn and pretzels. Onion salt, garlic salt, seasoned salt, table salt, and sea salt. Worcestershire sauce. Tartar sauce. Barbecue sauce. Teriyaki sauce. Soy sauce, including reduced-sodium. Steak sauce. Canned and packaged gravies. Fish sauce. Oyster sauce. Cocktail sauce. Horseradish that you find on the shelf. Ketchup. Mustard. Meat flavorings and tenderizers. Bouillon cubes. Hot sauce and Tabasco sauce. Premade or packaged marinades. Premade or packaged taco seasonings. Relishes. Regular salad dressings. Where to find more information:  National Heart, Lung, and Blood Institute: www.nhlbi.nih.gov  American Heart Association: www.heart.org Summary  The DASH eating plan is a healthy eating plan that has been shown to reduce high blood pressure (hypertension). It may also reduce your risk for type 2 diabetes, heart disease, and stroke.  With the DASH eating plan, you should limit salt (sodium) intake to 2,300 mg a day. If you have hypertension, you may need to reduce your sodium intake to 1,500 mg a day.  When on the DASH eating plan, aim to eat more fresh fruits and vegetables, whole grains, lean proteins, low-fat dairy, and heart-healthy fats.  Work with your health care provider or diet and nutrition specialist (dietitian) to adjust your eating plan to your   individual calorie needs. This information is not intended to replace advice given to you by your health care provider. Make sure you discuss any questions you have with your health care provider. Document Revised: 03/13/2017 Document Reviewed: 03/24/2016 Elsevier Patient Education  2020 Elsevier Inc.  

## 2020-02-20 ENCOUNTER — Other Ambulatory Visit: Payer: Self-pay | Admitting: Physician Assistant

## 2020-03-05 DIAGNOSIS — H906 Mixed conductive and sensorineural hearing loss, bilateral: Secondary | ICD-10-CM | POA: Insufficient documentation

## 2020-03-05 DIAGNOSIS — H6123 Impacted cerumen, bilateral: Secondary | ICD-10-CM | POA: Insufficient documentation

## 2020-03-17 ENCOUNTER — Other Ambulatory Visit: Payer: Self-pay | Admitting: Family Medicine

## 2020-03-20 ENCOUNTER — Other Ambulatory Visit: Payer: Self-pay | Admitting: Physician Assistant

## 2020-03-21 ENCOUNTER — Telehealth: Payer: Self-pay | Admitting: Physician Assistant

## 2020-03-21 IMAGING — US US RENAL ARTERY STENOSIS
1 series · 13 of 25 positions shown · non-contrast
Comparison: Prior renal ultrasound 06/18/2009

CLINICAL DATA: 57-year-old male with stage 3 chronic kidney disease
and hypertension

EXAM:
RENAL/URINARY TRACT ULTRASOUND
RENAL DUPLEX DOPPLER ULTRASOUND

[Series 1: us renal artery stenosis · 0.33mm/px · 13 of 74 slices shown]
[im 1/74]
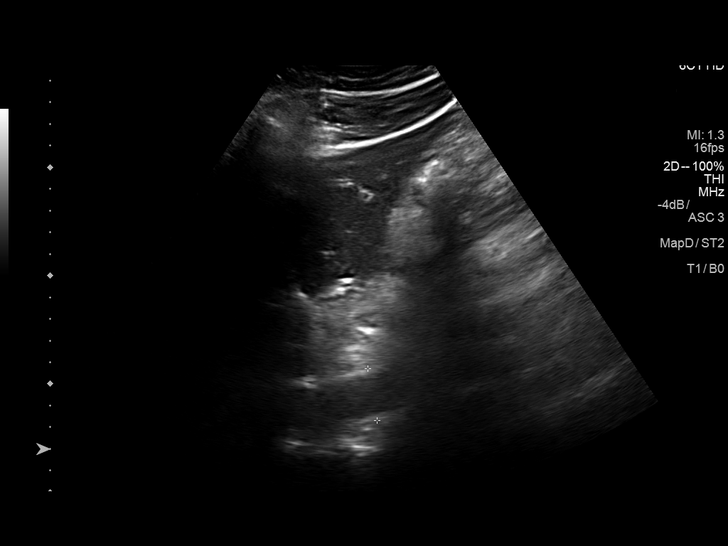
[im 7/74]
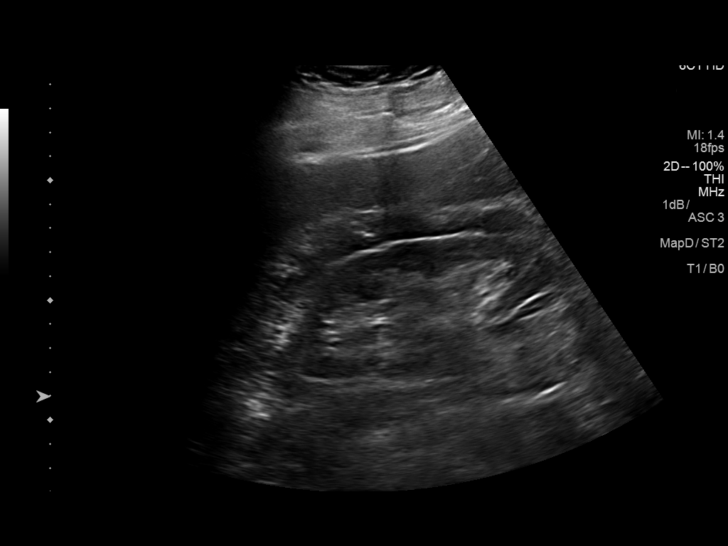
[im 13/74]
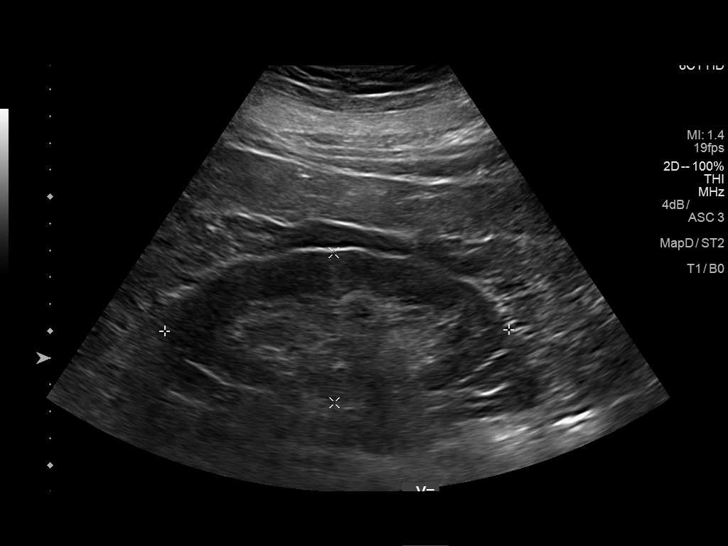
[im 19/74]
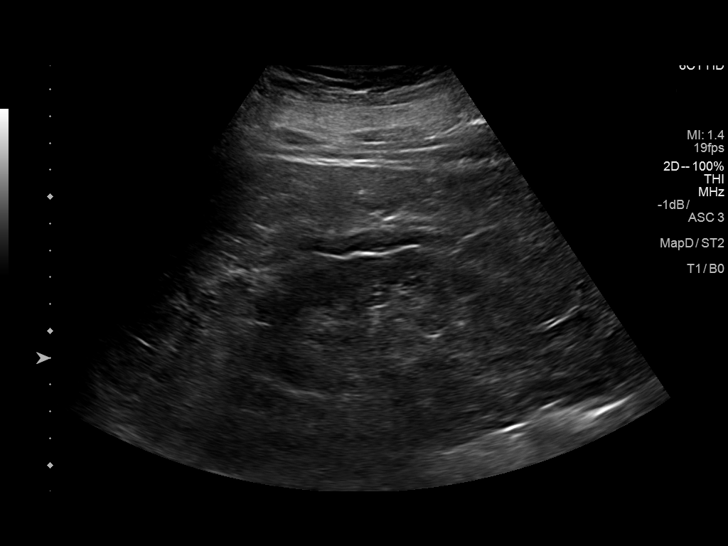
[im 25/74]
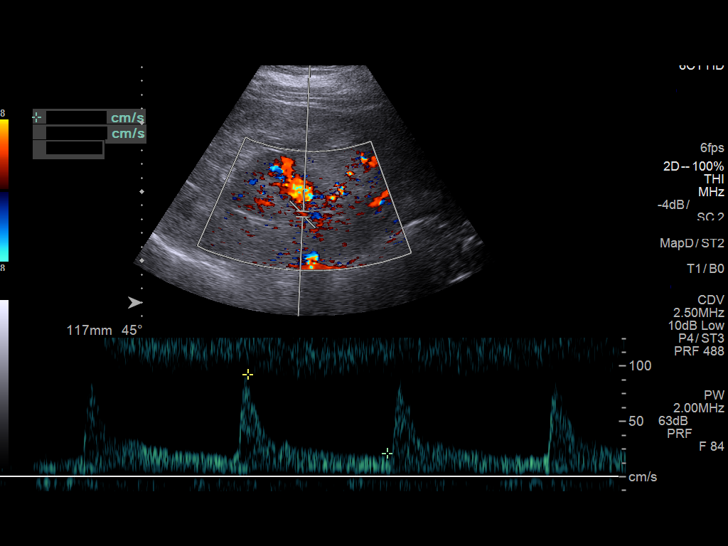
[im 31/74]
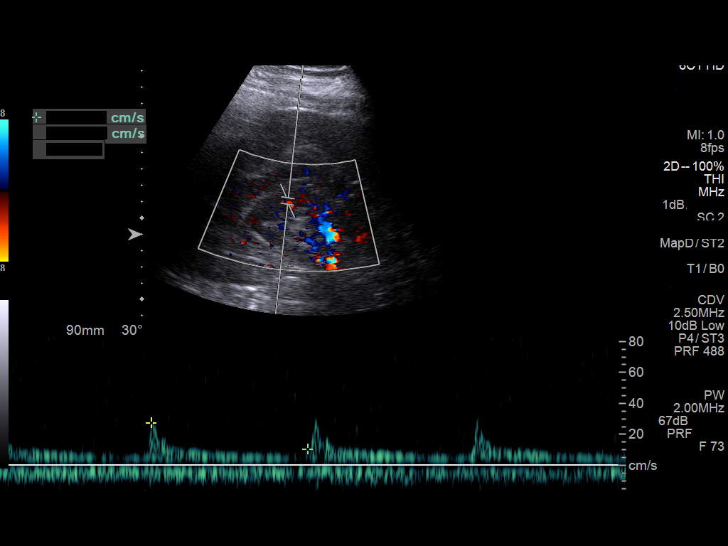
[im 37/74]
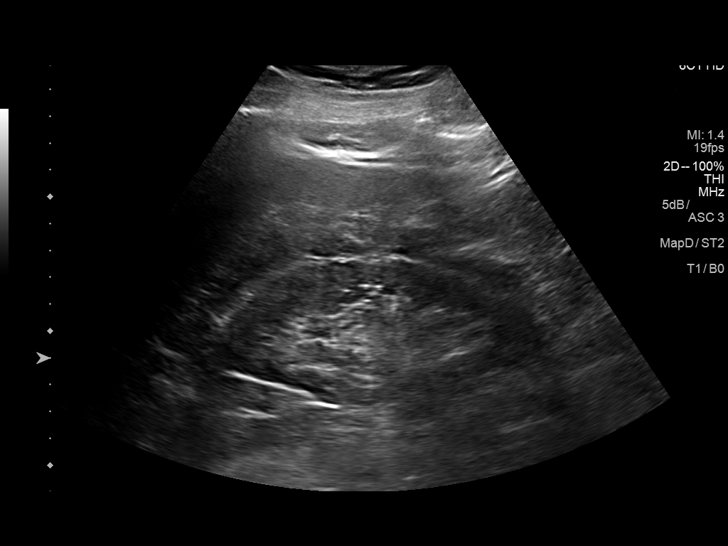
[im 43/74]
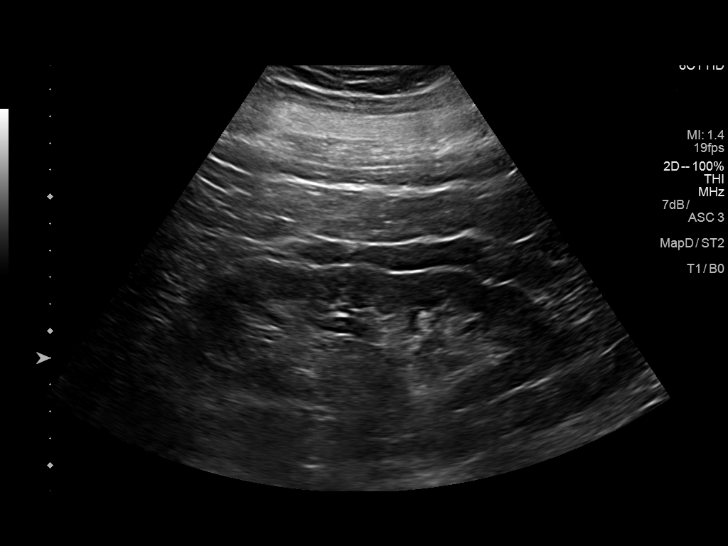
[im 49/74]
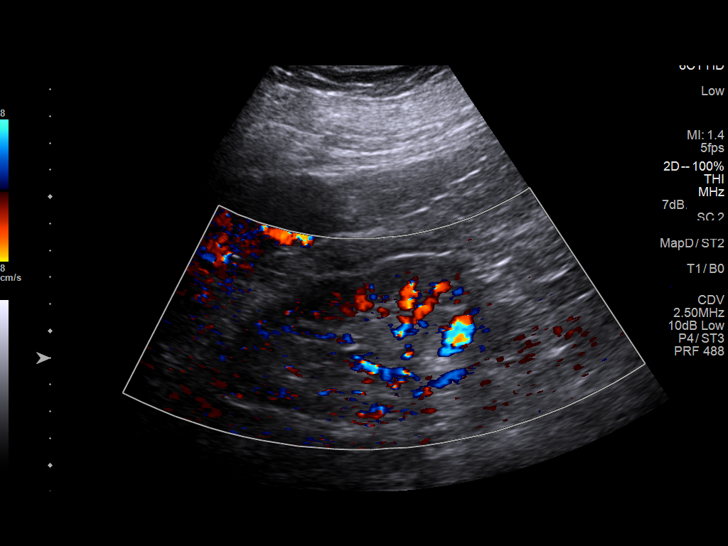
[im 55/74]
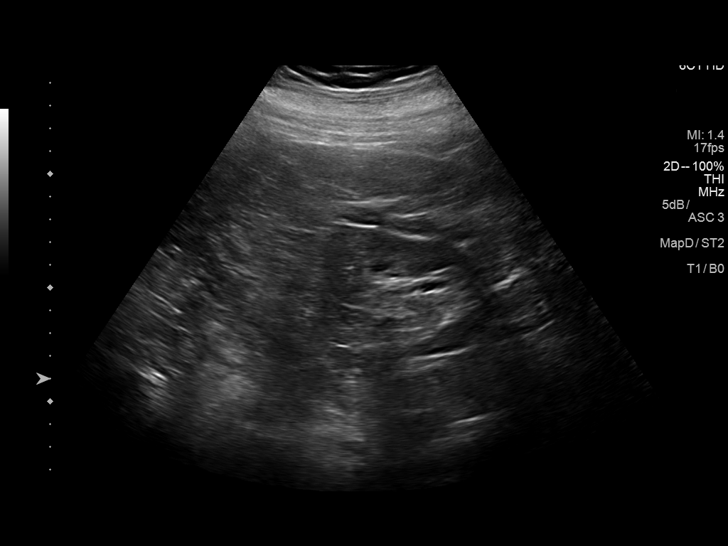
[im 61/74]
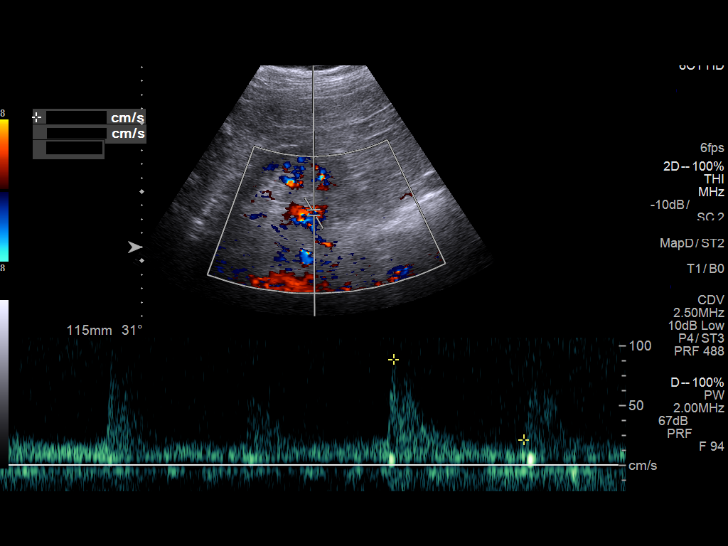
[im 67/74]
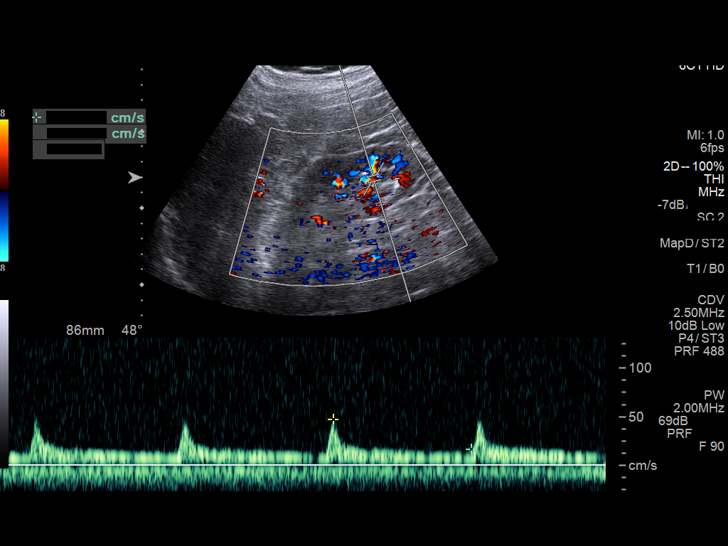
[im 74/74]
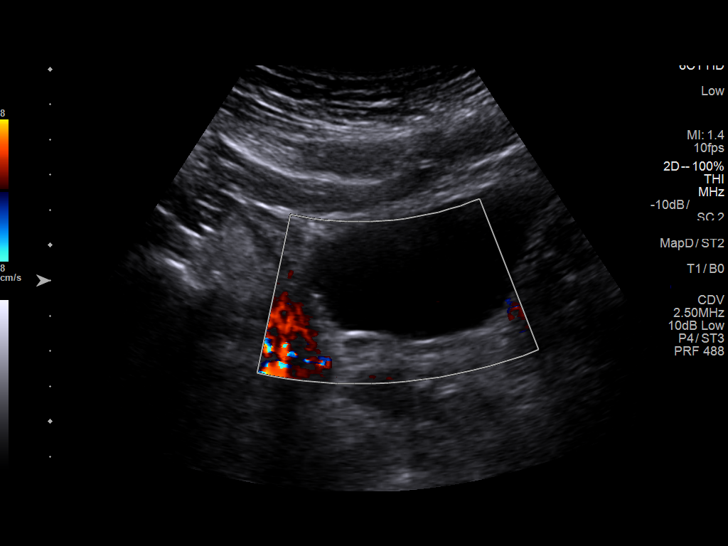

[13 of 25 positions shown; findings below may reference images not displayed]

FINDINGS: Right Kidney:

Length: 12.8 x 5.6 x 4.9 cm for a total volume of 183 mL.
Echogenicity within normal limits. No mass or hydronephrosis
visualized.

Left Kidney:

Length: 13.6 x 5.0 x 4.7 cm for a total volume of 166 mL.
Echogenicity within normal limits. No mass or hydronephrosis
visualized.

Bladder:  Within normal limits for the degree of distension.

RENAL DUPLEX ULTRASOUND

Right Renal Artery Velocities:

Origin: Obscured by overlying bowel gas

Mid:  98 cm/sec

Hilum:  95 cm/sec

Interlobar:  54 cm/sec

Arcuate:  23 cm/sec

Left Renal Artery Velocities:

Origin: Obscured by overlying bowel gas

Mid: Obscured by overlying bowel gas

Hilum:  89 cm/sec

Interlobar:  59 cm/sec

Arcuate:  38 cm/sec

Aortic Velocity:  89 cm/sec

Right Renal-Aortic Ratios:

Origin: Unable to calculate

Mid:

Hilum:

Interlobar:

Arcuate:

Left Renal-Aortic Ratios:

Origin: Unable to calculate

Mid: Unable to calculate

Hilum:

Interlobar:

Arcuate:

Other: Both renal veins are patent at the hilum. Partial imaging of
the right upper quadrant demonstrates echogenic foci within the
gallbladder lumen most consistent with cholelithiasis.
IMPRESSION: 1. Unfortunately, examination is of limited diagnostic quality
secondary to patient body habitus and obscuring bowel gas masking
the origin of both renal arteries as well as the mid aspect of the
left renal artery.
2. No compelling evidence of hemodynamically significant renal
artery stenosis identified in the imaged portions of the renal
arteries bilaterally. If further definitive imaging evaluation is
clinically warranted, CT or MR arteriogram of the abdomen could be
considered.
3. Normal sonographic appearance of the kidneys.
4. Cholelithiasis.

## 2020-03-21 MED ORDER — VICTOZA 18 MG/3ML ~~LOC~~ SOPN
PEN_INJECTOR | SUBCUTANEOUS | 0 refills | Status: DC
Start: 2020-03-21 — End: 2020-06-06

## 2020-03-21 NOTE — Addendum Note (Signed)
Addended by: Sylvester Harder on: 03/21/2020 11:12 AM   Modules accepted: Orders

## 2020-03-21 NOTE — Telephone Encounter (Signed)
Patient needs a refill on Victoza. [facility] on [address]. Thanks

## 2020-04-04 ENCOUNTER — Other Ambulatory Visit: Payer: BC Managed Care – PPO

## 2020-04-04 ENCOUNTER — Other Ambulatory Visit: Payer: Self-pay

## 2020-04-04 DIAGNOSIS — E785 Hyperlipidemia, unspecified: Secondary | ICD-10-CM

## 2020-04-04 DIAGNOSIS — E1159 Type 2 diabetes mellitus with other circulatory complications: Secondary | ICD-10-CM

## 2020-04-04 DIAGNOSIS — N189 Chronic kidney disease, unspecified: Secondary | ICD-10-CM

## 2020-04-04 DIAGNOSIS — E119 Type 2 diabetes mellitus without complications: Secondary | ICD-10-CM

## 2020-04-05 LAB — CBC WITH DIFFERENTIAL/PLATELET
Basophils Absolute: 0 10*3/uL (ref 0.0–0.2)
Basos: 0 %
EOS (ABSOLUTE): 0.1 10*3/uL (ref 0.0–0.4)
Eos: 1 %
Hematocrit: 44.2 % (ref 37.5–51.0)
Hemoglobin: 14.6 g/dL (ref 13.0–17.7)
Immature Grans (Abs): 0 10*3/uL (ref 0.0–0.1)
Immature Granulocytes: 0 %
Lymphocytes Absolute: 1.4 10*3/uL (ref 0.7–3.1)
Lymphs: 20 %
MCH: 30.2 pg (ref 26.6–33.0)
MCHC: 33 g/dL (ref 31.5–35.7)
MCV: 92 fL (ref 79–97)
Monocytes Absolute: 0.6 10*3/uL (ref 0.1–0.9)
Monocytes: 8 %
Neutrophils Absolute: 4.9 10*3/uL (ref 1.4–7.0)
Neutrophils: 71 %
Platelets: 220 10*3/uL (ref 150–450)
RBC: 4.83 x10E6/uL (ref 4.14–5.80)
RDW: 13.4 % (ref 11.6–15.4)
WBC: 7 10*3/uL (ref 3.4–10.8)

## 2020-04-05 LAB — LIPID PANEL
Chol/HDL Ratio: 2.9 ratio (ref 0.0–5.0)
Cholesterol, Total: 111 mg/dL (ref 100–199)
HDL: 38 mg/dL — ABNORMAL LOW (ref >39)
LDL Chol Calc (NIH): 56 mg/dL (ref 0–99)
Triglycerides: 85 mg/dL (ref 0–149)
VLDL Cholesterol Cal: 17 mg/dL (ref 5–40)

## 2020-04-05 LAB — HEMOGLOBIN A1C
Est. average glucose Bld gHb Est-mCnc: 148 mg/dL
Hgb A1c MFr Bld: 6.8 % — ABNORMAL HIGH (ref 4.8–5.6)

## 2020-04-05 LAB — COMPREHENSIVE METABOLIC PANEL
ALT: 22 IU/L (ref 0–44)
AST: 23 IU/L (ref 0–40)
Albumin/Globulin Ratio: 2 (ref 1.2–2.2)
Albumin: 4.3 g/dL (ref 3.8–4.9)
Alkaline Phosphatase: 102 IU/L (ref 44–121)
BUN/Creatinine Ratio: 13 (ref 9–20)
BUN: 23 mg/dL (ref 6–24)
Bilirubin Total: 0.4 mg/dL (ref 0.0–1.2)
CO2: 24 mmol/L (ref 20–29)
Calcium: 9.2 mg/dL (ref 8.7–10.2)
Chloride: 99 mmol/L (ref 96–106)
Creatinine, Ser: 1.81 mg/dL — ABNORMAL HIGH (ref 0.76–1.27)
GFR calc Af Amer: 47 mL/min/{1.73_m2} — ABNORMAL LOW (ref >59)
GFR calc non Af Amer: 41 mL/min/{1.73_m2} — ABNORMAL LOW (ref >59)
Globulin, Total: 2.2 g/dL (ref 1.5–4.5)
Glucose: 115 mg/dL — ABNORMAL HIGH (ref 65–99)
Potassium: 4.7 mmol/L (ref 3.5–5.2)
Sodium: 136 mmol/L (ref 134–144)
Total Protein: 6.5 g/dL (ref 6.0–8.5)

## 2020-04-05 LAB — TSH: TSH: 4.25 u[IU]/mL (ref 0.450–4.500)

## 2020-04-11 ENCOUNTER — Other Ambulatory Visit: Payer: Self-pay | Admitting: Adult Health

## 2020-04-11 DIAGNOSIS — E119 Type 2 diabetes mellitus without complications: Secondary | ICD-10-CM

## 2020-04-16 ENCOUNTER — Telehealth: Payer: Self-pay | Admitting: Physician Assistant

## 2020-04-16 DIAGNOSIS — E119 Type 2 diabetes mellitus without complications: Secondary | ICD-10-CM

## 2020-04-16 MED ORDER — BD PEN NEEDLE SHORT U/F 31G X 8 MM MISC: 11 refills | Status: DC

## 2020-04-16 NOTE — Telephone Encounter (Signed)
Refill sent to pharmacy. AS, CMA 

## 2020-05-20 ENCOUNTER — Other Ambulatory Visit: Payer: Self-pay | Admitting: Physician Assistant

## 2020-06-06 ENCOUNTER — Other Ambulatory Visit: Payer: Self-pay

## 2020-06-06 ENCOUNTER — Encounter: Payer: Self-pay | Admitting: Physician Assistant

## 2020-06-06 ENCOUNTER — Ambulatory Visit: Payer: BC Managed Care – PPO | Admitting: Physician Assistant

## 2020-06-06 VITALS — BP 143/79 | HR 72 | Temp 97.8°F | Ht 69.0 in | Wt 264.8 lb

## 2020-06-06 DIAGNOSIS — Z6839 Body mass index (BMI) 39.0-39.9, adult: Secondary | ICD-10-CM

## 2020-06-06 DIAGNOSIS — I2510 Atherosclerotic heart disease of native coronary artery without angina pectoris: Secondary | ICD-10-CM

## 2020-06-06 DIAGNOSIS — E1159 Type 2 diabetes mellitus with other circulatory complications: Secondary | ICD-10-CM

## 2020-06-06 DIAGNOSIS — E1169 Type 2 diabetes mellitus with other specified complication: Secondary | ICD-10-CM

## 2020-06-06 DIAGNOSIS — E785 Hyperlipidemia, unspecified: Secondary | ICD-10-CM

## 2020-06-06 DIAGNOSIS — I152 Hypertension secondary to endocrine disorders: Secondary | ICD-10-CM

## 2020-06-06 LAB — POCT GLYCOSYLATED HEMOGLOBIN (HGB A1C): Hemoglobin A1C: 6.6 % — AB (ref 4.0–5.6)

## 2020-06-06 MED ORDER — VICTOZA 18 MG/3ML ~~LOC~~ SOPN: PEN_INJECTOR | SUBCUTANEOUS | 1 refills | Status: DC

## 2020-06-06 NOTE — Progress Notes (Signed)
Established Patient Office Visit  Subjective:  Patient ID: Stanley Cook, male    DOB: [birth year]  Age: 60 y.o. MRN: [id number]  CC:  Chief Complaint  Patient presents with  . Depression  . Hypertension  . Hyperlipidemia    HPI GERRALD BASU presents for follow up on diabetes mellitus, hypertension and hyperlipidemia. Has no acute concerns.  Diabetes mellitus: Pt denies increased urination or thirst from baseline. Takes Lasix once weekly to help with edema which causes urinary frequency. Pt reports medication compliance. No hypoglycemic events. Checking glucose at home. FBS range 130s. States is working on dietary changes by reducing snacks and increasing fruits.   HTN: Pt denies chest pain, palpitations, dizziness, headache or increased lower extremity swelling. Taking medication as directed without side effects. States did not realize pharmacy had been filling Metoprolol 50 mg instead of 100 mg until he saw his Nephrologist in 12/21. Checks BP at home and readings range 124/80. Pt tries to drink water and monitor sodium intake.  HLD: Pt taking medication as directed without issues. States during the Winter is not as active and plans to start going to the gym/outdoor activities as the weather gets nicer.   Past Medical History:  Diagnosis Date  . Diabetes mellitus type II, non insulin dependent ([place])   . Hyperlipidemia   . Hypertension     History reviewed. No pertinent surgical history.  Family History  Problem Relation Age of Onset  . Unexplained death Father 56       Natural causes, no autopsy  . Diabetes Sister     Social History   Socioeconomic History  . Marital status: Single    Spouse name: Not on file  . Number of children: Not on file  . Years of education: Not on file  . Highest education level: Not on file  Occupational History  . 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 Smoking status: Never Smoker  .  Smokeless tobacco: Never Used  Substance and Sexual Activity  . Alcohol use: No  . Drug use: No  . Sexual activity: Not on file  Other Topics Concern  . Not on file  Social History Narrative   Pt lives alone in [place].   Social Determinants of Health   Financial Resource Strain: Not on file  Food Insecurity: Not on file  Transportation Needs: Not on file  Physical Activity: Not on file  Stress: Not on file  Social Connections: Not on file  Intimate Partner Violence: Not on file    Outpatient Medications Prior to Visit  Medication Sig Dispense Refill  . amLODipine (NORVASC) 10 MG tablet TAKE 1 TABLET(10 MG) BY MOUTH DAILY 90 tablet 3  . aspirin EC 81 MG tablet Take by mouth.    Marland Kitchen atorvastatin (LIPITOR) 40 MG tablet TAKE 1 TABLET(40 MG) BY MOUTH AT BEDTIME 90 tablet 0  . blood glucose meter kit and supplies KIT Dispense based on patient and insurance preference. Use up to four times daily as directed. (FOR ICD-9 250.00, 250.01). 1 each 0  . furosemide (LASIX) 40 MG tablet Take 40 mg by mouth. One tablet three times weekly as needed    . glucose blood test strip Use to check blood sugars twice daily 200 each 12  . Insulin Pen Needle (B-D ULTRAFINE III SHORT PEN) 31G X 8 MM MISC Use for injection daily 100 each 11  . metoprolol succinate (TOPROL-XL) 100 MG  24 hr tablet Take 100 mg by mouth daily.    . ramipril (ALTACE) 10 MG capsule Take 1 capsule (10 mg total) by mouth daily. Future refills from Nephrology 90 capsule 3  . sodium bicarbonate 650 MG tablet Take 650 mg by mouth daily.    Marland Kitchen triamcinolone cream (KENALOG) 0.1 % Apply 1 application topically 2 (two) times daily. 15 g 0  . liraglutide (VICTOZA) 18 MG/3ML SOPN ADMINISTER 0.6 MG UNDER THE SKIN EVERY DAY 9 mL 0   No facility-administered medications prior to visit.    Allergies  Allergen Reactions  . Penicillins Other (See Comments)    ROS Review of Systems A fourteen system review of systems was performed and  found to be positive as per HPI.  Objective:    Physical Exam General:  Well Developed, well nourished, appropriate for stated age.  Neuro:  Alert and oriented,  extra-ocular muscles intact  HEENT:  Normocephalic, atraumatic, neck supple, no carotid bruits appreciated  Skin:  no gross rash, warm, pink. Cardiac:  RRR, S1 S2 Respiratory:  ECTA B/L w/o wheezing, Not using accessory muscles, speaking in full sentences- unlabored. Vascular:  Ext warm, no cyanosis apprec.; cap RF less 2 sec. Psych:  No HI/SI, judgement and insight good, Euthymic mood. Full Affect.   BP (!) 143/79   Pulse 72   Temp 97.8 F (36.6 C)   Ht 5' 9"  (1.753 m)   Wt 264 lb 12.8 oz (120.1 kg)   SpO2 97%   BMI 39.10 kg/m  Wt Readings from Last 3 Encounters:  06/06/20 264 lb 12.8 oz (120.1 kg)  02/07/20 263 lb 12.8 oz (119.7 kg)  10/14/19 257 lb (116.6 kg)     Health Maintenance Due  Topic Date Due  . COLONOSCOPY (Pts 45-66yr Insurance coverage will need to be confirmed)  Never done  . COVID-19 Vaccine (3 - Booster for Pfizer series) 01/02/2020    There are no preventive care reminders to display for this patient.  Lab Results  Component Value Date   TSH 4.250 04/04/2020   Lab Results  Component Value Date   WBC 7.0 04/04/2020   HGB 14.6 04/04/2020   HCT 44.2 04/04/2020   MCV 92 04/04/2020   PLT 220 04/04/2020   Lab Results  Component Value Date   NA 136 04/04/2020   K 4.7 04/04/2020   CO2 24 04/04/2020   GLUCOSE 115 (H) 04/04/2020   BUN 23 04/04/2020   CREATININE 1.81 (H) 04/04/2020   BILITOT 0.4 04/04/2020   ALKPHOS 102 04/04/2020   AST 23 04/04/2020   ALT 22 04/04/2020   PROT 6.5 04/04/2020   ALBUMIN 4.3 04/04/2020   CALCIUM 9.2 04/04/2020   Lab Results  Component Value Date   CHOL 111 04/04/2020   Lab Results  Component Value Date   HDL 38 (L) 04/04/2020   Lab Results  Component Value Date   LDLCALC 56 04/04/2020   Lab Results  Component Value Date   TRIG 85  04/04/2020   Lab Results  Component Value Date   CHOLHDL 2.9 04/04/2020   Lab Results  Component Value Date   HGBA1C 6.6 (A) 06/06/2020      Assessment & Plan:   Problem List Items Addressed This Visit      Cardiovascular and Mediastinum   Hypertension associated with diabetes ([place] - Primary (Chronic)   Relevant Medications   liraglutide (VICTOZA) 18 MG/3ML SOPN   CAD (coronary artery disease) (Chronic)     Endocrine  Diabetes mellitus ([place])   Relevant Medications   liraglutide (VICTOZA) 18 MG/3ML SOPN   Other Relevant Orders   POCT glycosylated hemoglobin (Hb A1C) (Completed)    Other Visit Diagnoses    BMI 39.0-39.9,adult       Hyperlipidemia associated with type 2 diabetes mellitus ([place])       Relevant Medications   liraglutide (VICTOZA) 18 MG/3ML SOPN     CAD: -Followed by cardiology. -On atorvastatin and aspirin.  Diabetes mellitus: -A1c today has improved from 6.8 to 6.6. -Continue current medication regimen. Provided refill. -Continue with dietary changes and follow low carbohydrate and glucose diet. -Increase physical activity. -Will continue to monitor.  Hypertension associated with diabetes: -BP mildly elevated today. Ambulatory BP readings have been controlled <130/85. -Continue current medication regimen. -Recommend to increase water hydration and follow low sodium diet. -Will continue to monitor.  Hyperlipidemia associated with type diabetes mellitus: -Last lipid panel: total cholesterol 111, triglycerides 85, HDL 38, LDL 56 (at goal <70). -Continue current medication regimen. - Follow a  heart healthy diet and increase physical activity.   BMI 39.0-39.9, adult: -Associated with diabetes mellitus, hypertension and hyperlipidemia. -Encourage to continue weight loss efforts with diet changes and increasing physical activity as weather gets warmer.       Meds ordered this encounter  Medications  . liraglutide (VICTOZA) 18 MG/3ML SOPN     Sig: ADMINISTER 0.6 MG UNDER THE SKIN EVERY DAY    Dispense:  9 mL    Refill:  1    Order Specific Question:   Supervising Provider    Answer:   Beatrice Lecher D [[id number]]    Follow-up: Return in about 4 months (around 10/04/2020) for DM, HTN, HLD, CKD and FBW.   Note:  This note was prepared with assistance of Dragon voice recognition software. Occasional wrong-word or sound-a-like substitutions may have occurred due to the inherent limitations of voice recognition software.  Lorrene Reid, PA-C

## 2020-06-06 NOTE — Patient Instructions (Signed)

## 2020-07-25 ENCOUNTER — Other Ambulatory Visit: Payer: Self-pay | Admitting: Physician Assistant

## 2020-07-25 DIAGNOSIS — E119 Type 2 diabetes mellitus without complications: Secondary | ICD-10-CM

## 2020-08-18 ENCOUNTER — Other Ambulatory Visit: Payer: Self-pay | Admitting: Physician Assistant

## 2020-09-28 ENCOUNTER — Other Ambulatory Visit: Payer: Self-pay

## 2020-10-05 ENCOUNTER — Other Ambulatory Visit: Payer: Self-pay

## 2020-10-05 DIAGNOSIS — E119 Type 2 diabetes mellitus without complications: Secondary | ICD-10-CM

## 2020-10-05 DIAGNOSIS — E785 Hyperlipidemia, unspecified: Secondary | ICD-10-CM

## 2020-10-05 DIAGNOSIS — Z1329 Encounter for screening for other suspected endocrine disorder: Secondary | ICD-10-CM

## 2020-10-05 DIAGNOSIS — E1159 Type 2 diabetes mellitus with other circulatory complications: Secondary | ICD-10-CM

## 2020-10-05 DIAGNOSIS — N189 Chronic kidney disease, unspecified: Secondary | ICD-10-CM

## 2020-10-05 DIAGNOSIS — E1169 Type 2 diabetes mellitus with other specified complication: Secondary | ICD-10-CM

## 2020-10-08 ENCOUNTER — Other Ambulatory Visit: Payer: Self-pay

## 2020-10-08 DIAGNOSIS — E1159 Type 2 diabetes mellitus with other circulatory complications: Secondary | ICD-10-CM

## 2020-10-08 DIAGNOSIS — Z1329 Encounter for screening for other suspected endocrine disorder: Secondary | ICD-10-CM

## 2020-10-08 DIAGNOSIS — E1169 Type 2 diabetes mellitus with other specified complication: Secondary | ICD-10-CM

## 2020-10-08 DIAGNOSIS — E119 Type 2 diabetes mellitus without complications: Secondary | ICD-10-CM

## 2020-10-08 DIAGNOSIS — N189 Chronic kidney disease, unspecified: Secondary | ICD-10-CM

## 2020-10-08 DIAGNOSIS — E785 Hyperlipidemia, unspecified: Secondary | ICD-10-CM

## 2020-10-09 ENCOUNTER — Ambulatory Visit: Payer: BC Managed Care – PPO | Admitting: Physician Assistant

## 2020-10-09 ENCOUNTER — Encounter: Payer: Self-pay | Admitting: Physician Assistant

## 2020-10-09 VITALS — BP 135/81 | HR 66 | Temp 98.6°F | Ht 69.0 in | Wt 260.2 lb

## 2020-10-09 DIAGNOSIS — E119 Type 2 diabetes mellitus without complications: Secondary | ICD-10-CM | POA: Diagnosis not present

## 2020-10-09 DIAGNOSIS — E1159 Type 2 diabetes mellitus with other circulatory complications: Secondary | ICD-10-CM

## 2020-10-09 DIAGNOSIS — E1169 Type 2 diabetes mellitus with other specified complication: Secondary | ICD-10-CM | POA: Diagnosis not present

## 2020-10-09 DIAGNOSIS — N189 Chronic kidney disease, unspecified: Secondary | ICD-10-CM

## 2020-10-09 DIAGNOSIS — E785 Hyperlipidemia, unspecified: Secondary | ICD-10-CM

## 2020-10-09 DIAGNOSIS — I152 Hypertension secondary to endocrine disorders: Secondary | ICD-10-CM

## 2020-10-09 DIAGNOSIS — Z6838 Body mass index (BMI) 38.0-38.9, adult: Secondary | ICD-10-CM

## 2020-10-09 LAB — COMPREHENSIVE METABOLIC PANEL
ALT: 18 IU/L (ref 0–44)
AST: 20 IU/L (ref 0–40)
Albumin/Globulin Ratio: 2.1 (ref 1.2–2.2)
Albumin: 4.2 g/dL (ref 3.8–4.9)
Alkaline Phosphatase: 101 IU/L (ref 44–121)
BUN/Creatinine Ratio: 13 (ref 9–20)
BUN: 27 mg/dL — ABNORMAL HIGH (ref 6–24)
Bilirubin Total: 0.4 mg/dL (ref 0.0–1.2)
CO2: 22 mmol/L (ref 20–29)
Calcium: 9.4 mg/dL (ref 8.7–10.2)
Chloride: 99 mmol/L (ref 96–106)
Creatinine, Ser: 2.12 mg/dL — ABNORMAL HIGH (ref 0.76–1.27)
Globulin, Total: 2 g/dL (ref 1.5–4.5)
Glucose: 115 mg/dL — ABNORMAL HIGH (ref 65–99)
Potassium: 4.5 mmol/L (ref 3.5–5.2)
Sodium: 137 mmol/L (ref 134–144)
Total Protein: 6.2 g/dL (ref 6.0–8.5)
eGFR: 36 mL/min/{1.73_m2} — ABNORMAL LOW (ref >59)

## 2020-10-09 LAB — LIPID PANEL
Chol/HDL Ratio: 3.2 ratio (ref 0.0–5.0)
Cholesterol, Total: 118 mg/dL (ref 100–199)
HDL: 37 mg/dL — ABNORMAL LOW (ref >39)
LDL Chol Calc (NIH): 61 mg/dL (ref 0–99)
Triglycerides: 105 mg/dL (ref 0–149)
VLDL Cholesterol Cal: 20 mg/dL (ref 5–40)

## 2020-10-09 LAB — CBC
Hematocrit: 42.6 % (ref 37.5–51.0)
Hemoglobin: 14.4 g/dL (ref 13.0–17.7)
MCH: 30.1 pg (ref 26.6–33.0)
MCHC: 33.8 g/dL (ref 31.5–35.7)
MCV: 89 fL (ref 79–97)
Platelets: 215 10*3/uL (ref 150–450)
RBC: 4.78 x10E6/uL (ref 4.14–5.80)
RDW: 13 % (ref 11.6–15.4)
WBC: 8.4 10*3/uL (ref 3.4–10.8)

## 2020-10-09 LAB — HEMOGLOBIN A1C
Est. average glucose Bld gHb Est-mCnc: 146 mg/dL
Hgb A1c MFr Bld: 6.7 % — ABNORMAL HIGH (ref 4.8–5.6)

## 2020-10-09 LAB — TSH: TSH: 3.32 u[IU]/mL (ref 0.450–4.500)

## 2020-10-09 NOTE — Progress Notes (Signed)
Established Patient Office Visit  Subjective:  Patient ID: Stanley Cook, male    DOB: [birth year]  Age: 60 y.o. MRN: [id number]  CC:  Chief Complaint  Patient presents with   Diabetes   Hyperlipidemia   Hypertension    HPI Stanley Cook presents for follow up on diabetes mellitus, hypertension, hyperlipidemia and CKD.  Diabetes mellitus: Pt denies increased urination or thirst. Pt reports medication compliance. No hypoglycemic events. Checking glucose at home. Reports recently was eating out more and some of his fasting sugars were higher than normal. It was 160 this morning. Usually are 120-130.  HTN: Pt denies chest pain, palpitations, dizziness, orthopnea or shortness of breath. States edema is stable, some days will have more swelling than usual. Taking medication as directed without side effects. Reports taking Metoprolol 100 mg as instructed by nephrologist. Has not been checking BP at home lately. Pt is trying to increase water intake.  HLD: Pt taking medication as directed without issues. Reports has started going to the gym and runs for about a mile 3 times per wk.   CKD: Followed by Nephrology. Reports avoids NSAIDs and takes extra strength Tylenol when needed for pain relief.  Past Medical History:  Diagnosis Date   Diabetes mellitus type II, non insulin dependent ([place])    Hyperlipidemia    Hypertension     History reviewed. No pertinent surgical history.  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   Social Determinants of Health   Financial Resource Strain: Not on file  Food Insecurity: Not on file  Transportation Needs: Not on file  Physical Activity: Not on file  Stress: Not on file  Social Connections: Not on file  Intimate Partner Violence: Not on file    Outpatient Medications Prior to Visit  Medication Sig Dispense Refill   amLODipine (NORVASC) 10 MG tablet TAKE 1 TABLET(10 MG) BY MOUTH DAILY 90 tablet 3   aspirin EC 81 MG tablet Take by mouth.     atorvastatin (LIPITOR) 40 MG tablet TAKE 1 TABLET(40 MG) BY MOUTH AT BEDTIME 90 tablet 0   blood glucose meter kit and supplies KIT Dispense based on patient and insurance preference. Use up to four times daily as directed. (FOR ICD-9 250.00, 250.01). 1 each 0   furosemide (LASIX) 40 MG tablet Take 40 mg by mouth. One tablet three times weekly as needed     glucose blood test strip Use to check blood sugars twice daily 200 each 12   Insulin Pen Needle (B-D ULTRAFINE III SHORT PEN) 31G X 8 MM MISC USE DAILY 100 each 11   liraglutide (VICTOZA) 18 MG/3ML SOPN ADMINISTER 0.6 MG UNDER  THE SKIN EVERY DAY 9 mL 1   metoprolol succinate (TOPROL-XL) 100 MG 24 hr tablet Take 100 mg by mouth daily.     ramipril (ALTACE) 10 MG capsule Take 1 capsule (10 mg total) by mouth daily. Future refills from Nephrology 90 capsule 3   sodium bicarbonate 650 MG tablet Take 650 mg by mouth daily.     triamcinolone cream (KENALOG) 0.1 % Apply 1 application topically 2 (two) times daily. (Patient not taking: Reported on 10/09/2020) 15 g 0   No facility-administered medications prior to visit.    Allergies  Allergen Reactions   Penicillins Other (See Comments)    ROS Review of Systems A fourteen system review of systems was performed and found to be positive as per  HPI.  Objective:    Physical Exam General:  Well Developed, well nourished, appropriate for stated age.  Neuro:  Alert and oriented,  extra-ocular muscles intact  HEENT:  Normocephalic, atraumatic, neck supple,  Skin:  no gross rash, warm, pink. Cardiac:  RRR, S1 S2 Respiratory:  ECTA B/L, Not using accessory muscles, speaking in full sentences- unlabored. Vascular:  Ext warm, no cyanosis apprec.; cap RF less 2 sec. 1+ pitting edema of R LE Psych:  No HI/SI, judgement and insight good, Euthymic mood. Full Affect.  BP 135/81   Pulse 66   Temp 98.6 F (37 C)   Ht 5' 9" (1.753 m)   Wt 260 lb 3.2 oz (118 kg)   SpO2 97%   BMI 38.42 kg/m  Wt Readings from Last 3 Encounters:  10/09/20 260 lb 3.2 oz (118 kg)  06/06/20 264 lb 12.8 oz (120.1 kg)  02/07/20 263 lb 12.8 oz (119.7 kg)     Health Maintenance Due  Topic Date Due   Pneumococcal Vaccine 36-39 Years old (1 - PCV) Never done   Zoster Vaccines- Shingrix (1 of 2) Never done   COLONOSCOPY (Pts 45-43yr Insurance coverage will need to be confirmed)  Never done   COVID-19 Vaccine (3 - Pfizer risk series) 07/30/2019    There are no preventive care reminders to display for this patient.  Lab Results  Component Value Date   TSH 3.320 10/08/2020   Lab Results  Component Value Date   WBC 8.4 10/08/2020   HGB 14.4 10/08/2020   HCT 42.6 10/08/2020   MCV 89 10/08/2020   PLT 215 10/08/2020   Lab Results  Component Value Date   NA 137 10/08/2020   K 4.5 10/08/2020   CO2 22 10/08/2020   GLUCOSE WILL FOLLOW 10/08/2020   BUN 27 (H) 10/08/2020   CREATININE WILL FOLLOW 10/08/2020   BILITOT 0.4 10/08/2020   ALKPHOS WILL FOLLOW 10/08/2020   AST WILL FOLLOW 10/08/2020   ALT WILL FOLLOW 10/08/2020   PROT 6.2 10/08/2020   ALBUMIN WILL FOLLOW 10/08/2020   CALCIUM 9.4 10/08/2020   EGFR WILL FOLLOW 10/08/2020   Lab Results  Component Value Date   CHOL 118 10/08/2020   Lab Results  Component Value Date   HDL 37 (L)  10/08/2020   Lab Results  Component Value Date   LDLCALC 61 10/08/2020   Lab Results  Component Value Date   TRIG 105 10/08/2020   Lab Results  Component Value Date   CHOLHDL 3.2 10/08/2020   Lab Results  Component Value Date   HGBA1C 6.7 (H) 10/08/2020      Assessment & Plan:   Problem List Items Addressed This Visit       Cardiovascular and  Mediastinum   Hypertension associated with diabetes ([place]) (Chronic)    -BP initially elevated, BP recheck improved and stable. -Continue current medication regimen. -Recommend to resume ambulatory BP monitoring. -Advised to monitor sodium intake and take a Lasix dose for today. -Will continue to monitor.  Recent CMP results pending, will notify once resulted.         Endocrine   Type 2 diabetes mellitus without complication, without long-term current use of insulin ([facility]) - Primary    - Recent A1c 6.7, stable. -Continue current medication regimen. -Recommend to reduce carbohydrate intake. -Continue to stay as active as possible. -Will continue to monitor.         Genitourinary   Chronic kidney disease (Chronic)    -Followed by nephrology. -Recent CMP results still pending. -Continue to avoid NSAIDs.       Other Visit Diagnoses     Hyperlipidemia associated with type 2 diabetes mellitus ([place])       Class 2 severe obesity with serious comorbidity and body mass index (BMI) of 38.0 to 38.9 in adult, unspecified obesity type ([place])          Hyperlipidemia associated with type 2 diabetes mellitus: -Discussed recent lipid panel: total cholesterol 118, triglycerides 105, HDL 37, LDL 61 -Continue current medication regimen. -Recommend to reduce simple carbohydrates and saturated/trans fats. Continue to stay as active as possible. -Will continue to monitor.  Class II severe obesity with serious comorbidity and body mass index (BMI) of 38.0 to 38.9 in adult, unspecified obesity type: -Associated with diabetes mellitus  type 2, hypertension, and hyperlipidemia. -Patient has lost 5 pounds since last visit. -Recommend to continue weight loss efforts with dietary changes and physical activity.  Discussed recent lab results with the exception of CMP which is pending. CBC and TSH normal.  No orders of the defined types were placed in this encounter.   Follow-up: Return in about 3 months (around 01/09/2021) for DM, HTN, HLD, CKD.   Note:  This note was prepared with assistance of Dragon voice recognition software. Occasional wrong-word or sound-a-like substitutions may have occurred due to the inherent limitations of voice recognition software.  Lorrene Reid, PA-C

## 2020-10-09 NOTE — Assessment & Plan Note (Signed)
-   Recent A1c 6.7, stable. -Continue current medication regimen. -Recommend to reduce carbohydrate intake. -Continue to stay as active as possible. -Will continue to monitor.

## 2020-10-09 NOTE — Assessment & Plan Note (Addendum)
-  Followed by nephrology. -Recent CMP results still pending. -Continue to avoid NSAIDs.

## 2020-10-09 NOTE — Assessment & Plan Note (Signed)
-  BP initially elevated, BP recheck improved and stable. -Continue current medication regimen. -Recommend to resume ambulatory BP monitoring. -Advised to monitor sodium intake and take a Lasix dose for today. -Will continue to monitor.  Recent CMP results pending, will notify once resulted.

## 2020-10-09 NOTE — Patient Instructions (Signed)

## 2020-10-10 ENCOUNTER — Encounter: Payer: Self-pay | Admitting: Cardiovascular Disease

## 2020-10-10 ENCOUNTER — Ambulatory Visit: Payer: BC Managed Care – PPO | Admitting: Cardiovascular Disease

## 2020-10-10 ENCOUNTER — Other Ambulatory Visit: Payer: Self-pay

## 2020-10-10 VITALS — BP 132/74 | HR 60 | Ht 69.5 in | Wt 256.4 lb

## 2020-10-10 DIAGNOSIS — E785 Hyperlipidemia, unspecified: Secondary | ICD-10-CM

## 2020-10-10 DIAGNOSIS — I152 Hypertension secondary to endocrine disorders: Secondary | ICD-10-CM

## 2020-10-10 DIAGNOSIS — I2510 Atherosclerotic heart disease of native coronary artery without angina pectoris: Secondary | ICD-10-CM | POA: Diagnosis not present

## 2020-10-10 DIAGNOSIS — I252 Old myocardial infarction: Secondary | ICD-10-CM

## 2020-10-10 DIAGNOSIS — R0989 Other specified symptoms and signs involving the circulatory and respiratory systems: Secondary | ICD-10-CM

## 2020-10-10 DIAGNOSIS — E1159 Type 2 diabetes mellitus with other circulatory complications: Secondary | ICD-10-CM | POA: Diagnosis not present

## 2020-10-10 NOTE — Assessment & Plan Note (Signed)
History of essential hypertension a blood pressure measured today 132/74.  He is on amlodipine, metoprolol and ramipril.

## 2020-10-10 NOTE — Assessment & Plan Note (Signed)
>>  ASSESSMENT AND PLAN FOR DYSLIPIDEMIA WRITTEN ON 10/10/2020  9:12 AM BY BERRY, Pearletha Forge, MD  History of dyslipidemia on atorvastatin 40 mg a day with lipid profile performed 10/08/2020 revealing LDL of 61.,

## 2020-10-10 NOTE — Assessment & Plan Note (Addendum)
Room history of CAD status post non-STEMI in [place] with cardiac catheterization which showed noncritical CAD and echo performed 04/15/2018 that revealed normal LV function.  The patient denies chest pain or shortness of breath.

## 2020-10-10 NOTE — Progress Notes (Signed)
10/10/2020 Stanley Cook   [birth year]  [id number]  Primary Physician Lorrene Reid, PA-C Primary Cardiologist: Lorretta Harp MD [name], [place]  HPI:  Stanley Cook is a 60 y.o.  single male with no children who works as as 6 Journalist, newspaper at [employer]..  His primary care provider is Dr. Lorrene Reid.    I last saw him in the office 10/14/2019. He has a history of treated hypertension, and hyperlipidemia.  He also has diabetes and chronic kidney disease with a serum creatinine in the 2 range.  He does not smoke.  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  He did have a 2D echo performed 04/15/2018 that revealed normal LV function.  He denies chest pain or shortness of breath.   Since I saw him a year ago he continues to do well.  He is completely asymptomatic.  He did have recent blood work performed by his PCP on 10/08/2020 revealing an LDL of 61.   Current Meds  Medication Sig   amLODipine (NORVASC) 10 MG tablet TAKE 1 TABLET(10 MG) BY MOUTH DAILY   aspirin EC 81 MG tablet Take by mouth.   atorvastatin (LIPITOR) 40 MG tablet TAKE 1 TABLET(40 MG) BY MOUTH AT BEDTIME   blood glucose meter kit and supplies KIT Dispense based on patient and insurance preference. Use up to four times daily as directed. (FOR ICD-9 250.00, 250.01).   furosemide (LASIX) 40 MG tablet Take 40 mg by mouth. One tablet three times weekly as needed   glucose blood test strip Use to check blood sugars twice daily   Insulin Pen Needle (B-D ULTRAFINE III SHORT PEN) 31G X 8 MM MISC USE DAILY   liraglutide (VICTOZA) 18 MG/3ML SOPN ADMINISTER 0.6 MG UNDER THE SKIN EVERY DAY   metoprolol succinate (TOPROL-XL) 100 MG 24 hr tablet Take 100 mg by mouth daily.   ramipril (ALTACE) 10 MG capsule Take 1 capsule (10 mg total) by mouth daily. Future refills from Nephrology   sodium bicarbonate 650 MG tablet Take 650 mg by mouth daily.     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   Social Determinants of Health   Financial Resource Strain: Not on file  Food Insecurity: Not on file  Transportation Needs: Not on file  Physical Activity: Not on file  Stress: Not on file  Social Connections: Not on file  Intimate Partner Violence: Not on file     Review of Systems: General: negative for chills, fever, night sweats or weight changes.  Cardiovascular: negative for chest pain, dyspnea on exertion, edema, orthopnea, palpitations, paroxysmal nocturnal dyspnea or shortness of breath Dermatological: negative for rash Respiratory: negative for cough or wheezing  Urologic: negative for hematuria Abdominal: negative for nausea, vomiting, diarrhea, bright red blood per rectum, melena, or hematemesis Neurologic: negative for visual changes, syncope, or dizziness All other systems reviewed and are otherwise negative except as noted above.    Blood pressure 132/74, pulse 60, height 5' 9.5" (1.765 m), weight 256 lb 6.4 oz (116.3 kg), SpO2 97 %.  General appearance: alert and no distress Neck: no adenopathy, no JVD, supple, symmetrical, trachea midline, thyroid not enlarged, symmetric, no tenderness/mass/nodules, and right carotid bruit Lungs: clear to auscultation bilaterally Heart: regular rate and rhythm, S1, S2 normal, no murmur, click, rub or gallop Extremities:  extremities normal, atraumatic, no cyanosis or edema Pulses: 2+ and symmetric Skin: Skin color, texture, turgor normal. No rashes or lesions Neurologic: Grossly normal  EKG sinus rhythm at 60 with incomplete right bundle branch block and inferior Q waves along with anterolateral T wave inversion unchanged from prior EKGs.  I personally reviewed this EKG.  ASSESSMENT AND PLAN:   Hypertension associated with diabetes ([place]) History of essential hypertension a blood pressure measured today 132/74.  He is on amlodipine, metoprolol and ramipril.  Dyslipidemia History of dyslipidemia on atorvastatin 40 mg a day with lipid profile performed 10/08/2020 revealing LDL of 61.,  History of non-ST elevation myocardial infarction (NSTEMI) Room history of CAD status post non-STEMI in [place] with cardiac catheterization which showed noncritical CAD and echo performed 04/15/2018 that revealed normal LV function.  The patient denies chest pain or shortness of breath.  Right carotid bruit Will check carotid Doppler studies.     Lorretta Harp MD FACP,FACC,FAHA, [facility] 10/10/2020 9:13 AM

## 2020-10-10 NOTE — Assessment & Plan Note (Signed)
History of dyslipidemia on atorvastatin 40 mg a day with lipid profile performed 10/08/2020 revealing LDL of 61.,

## 2020-10-10 NOTE — Assessment & Plan Note (Signed)
Will check carotid Doppler studies. 

## 2020-10-10 NOTE — Patient Instructions (Addendum)
Medication Instructions:  Your physician recommends that you continue on your current medications as directed. Please refer to the Current Medication list given to you today.  *If you need a refill on your cardiac medications before your next appointment, please call your pharmacy*  Lab Work: NONE ordered at this time of appointment   If you have labs (blood work) drawn today and your tests are completely normal, you will receive your results only by: MyChart Message (if you have MyChart) OR A paper copy in the mail If you have any lab test that is abnormal or we need to change your treatment, we will call you to review the results.  Testing/Procedures: Your physician has requested that you have a carotid duplex. This test is an ultrasound of the carotid arteries in your neck. It looks at blood flow through these arteries that supply the brain with blood. Allow one hour for this exam. There are no restrictions or special instructions.  Please schedule for 1-2 weeks   Follow-Up: At [facility], you and your health needs are our priority.  As part of our continuing mission to provide you with exceptional heart care, we have created designated Provider Care Teams.  These Care Teams include your primary Cardiologist (physician) and Advanced Practice Providers (APPs -  Physician Assistants and Nurse Practitioners) who all work together to provide you with the care you need, when you need it.  Your next appointment:   1 year(s)  The format for your next appointment:   In Person  Provider:   Nanetta Batty, MD  Other Instructions

## 2020-10-11 ENCOUNTER — Ambulatory Visit (HOSPITAL_COMMUNITY)
Admission: RE | Admit: 2020-10-11 | Discharge: 2020-10-11 | Disposition: A | Discharge status: Home / Self Care | Payer: BC Managed Care – PPO | Source: Ambulatory Visit | Attending: Cardiovascular Disease | Admitting: Cardiovascular Disease

## 2020-10-11 DIAGNOSIS — R0989 Other specified symptoms and signs involving the circulatory and respiratory systems: Secondary | ICD-10-CM

## 2020-10-12 ENCOUNTER — Encounter: Payer: Self-pay | Admitting: *Deleted

## 2020-11-15 ENCOUNTER — Other Ambulatory Visit: Payer: Self-pay | Admitting: Cardiovascular Disease

## 2020-11-20 ENCOUNTER — Telehealth: Payer: Self-pay | Admitting: Physician Assistant

## 2020-11-20 DIAGNOSIS — E1169 Type 2 diabetes mellitus with other specified complication: Secondary | ICD-10-CM

## 2020-11-20 MED ORDER — VICTOZA 18 MG/3ML ~~LOC~~ SOPN: PEN_INJECTOR | SUBCUTANEOUS | 1 refills | Status: DC

## 2020-11-20 NOTE — Addendum Note (Signed)
Addended by: Sylvester Harder on: 11/20/2020 11:18 AM   Modules accepted: Orders

## 2020-11-20 NOTE — Telephone Encounter (Signed)
Patient needs a refill on Victoza and uses [facility] on [address], thanks

## 2020-12-16 ENCOUNTER — Other Ambulatory Visit: Payer: Self-pay | Admitting: Physician Assistant

## 2021-01-09 ENCOUNTER — Encounter: Payer: Self-pay | Admitting: Physician Assistant

## 2021-01-09 ENCOUNTER — Other Ambulatory Visit: Payer: Self-pay

## 2021-01-09 ENCOUNTER — Ambulatory Visit: Payer: BC Managed Care – PPO | Admitting: Physician Assistant

## 2021-01-09 VITALS — BP 143/78 | HR 62 | Temp 98.1°F | Ht 70.0 in | Wt 238.0 lb

## 2021-01-09 DIAGNOSIS — I152 Hypertension secondary to endocrine disorders: Secondary | ICD-10-CM

## 2021-01-09 DIAGNOSIS — E1159 Type 2 diabetes mellitus with other circulatory complications: Secondary | ICD-10-CM | POA: Diagnosis not present

## 2021-01-09 DIAGNOSIS — E1169 Type 2 diabetes mellitus with other specified complication: Secondary | ICD-10-CM

## 2021-01-09 DIAGNOSIS — Z23 Encounter for immunization: Secondary | ICD-10-CM | POA: Diagnosis not present

## 2021-01-09 DIAGNOSIS — N1832 Chronic kidney disease, stage 3b: Secondary | ICD-10-CM | POA: Diagnosis not present

## 2021-01-09 DIAGNOSIS — E785 Hyperlipidemia, unspecified: Secondary | ICD-10-CM

## 2021-01-09 LAB — POCT GLYCOSYLATED HEMOGLOBIN (HGB A1C): Hemoglobin A1C: 6.3 % — AB (ref 4.0–5.6)

## 2021-01-09 NOTE — Progress Notes (Signed)
Established Patient Office Visit  Subjective:  Patient ID: Stanley Cook, male    DOB: 15-Sep-1960  Age: 60 y.o. MRN: [id number]  CC:  Chief Complaint  Patient presents with   Follow-up   Diabetes   Hypertension   Hyperlipidemia   Chronic Kidney Disease    HPI Stanley Cook presents for follow up on diabetes mellitus, hypertension and hyperlipidemia. Patient reports is doing well, has no acute concerns today.  Diabetes: Pt denies increased urination or thirst. Pt reports medication compliance. No hypoglycemic events. Checking glucose at home. FBS average 130-140. States tries to limit sodas and working on reducing sugar intake.  HTN: Pt denies chest pain, palpitations, dizziness or leg swelling. Taking medication as directed without side effects. Checks BP at home and readings average 121/70s. Pt follows a low salt diet. Working on Designer, fashion/clothing intake.  HLD: Pt taking medication as directed without issues. Denies side effects including myalgias and RUQ pain. Has not been eating out as much and cooking more at home.  CKD: Patient is followed by [facility]. States has upcoming appointment including blood work in a few months.  Past Medical History:  Diagnosis Date   Diabetes mellitus type II, non insulin dependent ([place])    Hyperlipidemia    Hypertension     History reviewed. No pertinent surgical history.  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   Social Determinants of Health   Financial Resource Strain: Not on file  Food Insecurity: Not on file  Transportation Needs: Not on file  Physical Activity: Not on file  Stress: Not on file  Social Connections: Not on file  Intimate Partner Violence: Not on file    Outpatient Medications Prior to Visit  Medication Sig Dispense Refill   amLODipine (NORVASC) 10 MG tablet TAKE 1 TABLET(10 MG) BY MOUTH DAILY 90 tablet 3   aspirin EC 81 MG tablet Take by mouth.     atorvastatin (LIPITOR) 40 MG tablet TAKE 1 TABLET(40 MG) BY MOUTH AT BEDTIME 90 tablet 0   blood glucose meter kit and supplies KIT Dispense based on patient and insurance preference. Use up to four times daily as directed. (FOR ICD-9 250.00, 250.01). 1 each 0   furosemide (LASIX) 40 MG tablet Take 40 mg by mouth. One tablet three times weekly as needed     glucose blood test strip Use to check blood sugars twice daily 200 each 12   Insulin Pen Needle (B-D ULTRAFINE III SHORT PEN) 31G X 8 MM MISC USE DAILY 100 each 11   liraglutide (VICTOZA) 18 MG/3ML SOPN ADMINISTER 0.6 MG UNDER THE SKIN EVERY DAY 9 mL 1   metoprolol succinate (  TOPROL-XL) 100 MG 24 hr tablet Take 100 mg by mouth daily.     ramipril (ALTACE) 10 MG capsule Take 1 capsule (10 mg total) by mouth daily. Future refills from Nephrology 90 capsule 3   sodium bicarbonate 650 MG tablet Take 650 mg by mouth daily.     No facility-administered medications prior to visit.    Allergies  Allergen Reactions   Penicillins Other (See Comments)    ROS Review of Systems A fourteen system review of systems was performed and found to be positive as per HPI.   Objective:    Physical Exam General:  Well Developed, well nourished, appropriate for stated age.  Neuro:  Alert and oriented,  extra-ocular muscles intact  HEENT:  Normocephalic, atraumatic, neck  supple Skin:  no gross rash, warm, pink. Cardiac:  RRR, S1 S2, no murmur  Respiratory:  CTA B/L, Not using accessory muscles, speaking in full sentences- unlabored. Vascular:  Ext warm, no cyanosis apprec.; cap RF less 2 sec. Psych:  No HI/SI, judgement and insight good, Euthymic mood. Full Affect.  BP (!) 143/78   Pulse 62   Temp 98.1 F (36.7 C)   Ht _0  (1.778 m)   Wt 238 lb (108 kg)   SpO2 99%   BMI 34.15 kg/m  Wt Readings from Last 3 Encounters:  01/09/21 238 lb (108 kg)  10/10/20 256 lb 6.4 oz (116.3 kg)  10/09/20 260 lb 3.2 oz (118 kg)     Health Maintenance Due  Topic Date Due   Zoster Vaccines- Shingrix (1 of 2) Never done   COLONOSCOPY (Pts 45-64yr Insurance coverage will need to be confirmed)  Never done   COVID-19 Vaccine (3 - Pfizer risk series) 07/30/2019   OPHTHALMOLOGY EXAM  12/02/2020    There are no preventive care reminders to display for this patient.  Lab Results  Component Value Date   TSH 3.320 10/08/2020   Lab Results  Component Value Date   WBC 8.4 10/08/2020   HGB 14.4 10/08/2020   HCT 42.6 10/08/2020   MCV 89 10/08/2020   PLT 215 10/08/2020   Lab Results  Component Value Date   NA 137 10/08/2020   K 4.5 10/08/2020   CO2 22 10/08/2020   GLUCOSE 115 (H) 10/08/2020   BUN 27 (H) 10/08/2020   CREATININE 2.12 (H) 10/08/2020   BILITOT 0.4 10/08/2020   ALKPHOS 101 10/08/2020   AST 20 10/08/2020   ALT 18 10/08/2020   PROT 6.2 10/08/2020   ALBUMIN 4.2 10/08/2020   CALCIUM 9.4 10/08/2020   EGFR 36 (L) 10/08/2020   Lab Results  Component Value Date   CHOL 118 10/08/2020   Lab Results  Component Value Date   HDL 37 (L) 10/08/2020   Lab Results  Component Value Date   LDLCALC 61 10/08/2020   Lab Results  Component Value Date   TRIG 105 10/08/2020   Lab Results  Component Value Date   CHOLHDL 3.2 10/08/2020   Lab Results  Component Value Date   HGBA1C 6.3 (A) 01/09/2021      Assessment & Plan:   Problem List  Items Addressed This Visit       Cardiovascular and Mediastinum   Hypertension associated with diabetes ([place] (Chronic)    -BP elevated in office, BP repeated and unchanged. Ambulatory BP readings stable. Will reassess BP at follow up visit. Recommend to continue with ambulatory BP monitoring. -Continue current medication regimen.        Endocrine  Diabetes mellitus ([place]) - Primary    -A1c has improved from 6.7 to 6.3. -Continue current medication regimen. Patient will benefit from [place] for DM and CKD, recommend to re-consider. -Continue ambulatory glucose monitoring and notify clinic if FBS consistently <80 or >160. -Recommend to follow a low glucose/carbohydrate diet and stay as active as possible. -Will continue to monitor. -Patient plans to call his eye doctor and schedule annual eye exam.       Relevant Orders   POCT glycosylated hemoglobin (Hb A1C) (Completed)     Genitourinary   Chronic kidney disease (Chronic)   Other Visit Diagnoses     Hyperlipidemia associated with type 2 diabetes mellitus ([place])       Need for influenza vaccination       Relevant Orders   Flu Vaccine QUAD 69moIM (Fluarix, Fluzone & Alfiuria Quad PF) (Completed)      Hyperlipidemia associated with type 2 diabetes mellitus: -Last lipid panel: LDL 61, HDL 37 -Continue current medication regimen. Last ALT 18. -Discussed low fat diet. -Will continue to monitor.  Chronic kidney disease: -Followed by Nephrology.  -Recommend patient to consider FWilder Gladeto help preserve renal function as advised by nephrologist. Will further discuss at follow up visit. -Avoid nephrotoxic substances.  No orders of the defined types were placed in this encounter.   Follow-up: Return in about 3 months (around 04/10/2021) for CPE and FBW a few days prior.    MLorrene Reid PA-C

## 2021-01-09 NOTE — Assessment & Plan Note (Signed)
>>  ASSESSMENT AND PLAN FOR DIABETES MELLITUS WRITTEN ON 01/09/2021  5:36 PM BY ABONZA, MARITZA, PA-C  -A1c has improved from 6.7 to 6.3. -Continue current medication regimen. Patient will benefit from [place] for DM and CKD, recommend to re-consider. -Continue ambulatory glucose monitoring and notify clinic if FBS consistently <80 or >160. -Recommend to follow a low glucose/carbohydrate diet and stay as active as possible. -Will continue to monitor. -Patient plans to call his eye doctor and schedule annual eye exam.

## 2021-01-09 NOTE — Patient Instructions (Signed)
Diabetes Mellitus and Nutrition, Adult When you have diabetes, or diabetes mellitus, it is very important to have healthy eating habits because your blood sugar (glucose) levels are greatly affected by what you eat and drink. Eating healthy foods in the right amounts, at about the same times every day, can help you:  Control your blood glucose.  Lower your risk of heart disease.  Improve your blood pressure.  Reach or maintain a healthy weight. What can affect my meal plan? Every person with diabetes is different, and each person has different needs for a meal plan. Your health care provider may recommend that you work with a dietitian to make a meal plan that is best for you. Your meal plan may vary depending on factors such as:  The calories you need.  The medicines you take.  Your weight.  Your blood glucose, blood pressure, and cholesterol levels.  Your activity level.  Other health conditions you have, such as heart or kidney disease. How do carbohydrates affect me? Carbohydrates, also called carbs, affect your blood glucose level more than any other type of food. Eating carbs naturally raises the amount of glucose in your blood. Carb counting is a method for keeping track of how many carbs you eat. Counting carbs is important to keep your blood glucose at a healthy level, especially if you use insulin or take certain oral diabetes medicines. It is important to know how many carbs you can safely have in each meal. This is different for every person. Your dietitian can help you calculate how many carbs you should have at each meal and for each snack. How does alcohol affect me? Alcohol can cause a sudden decrease in blood glucose (hypoglycemia), especially if you use insulin or take certain oral diabetes medicines. Hypoglycemia can be a life-threatening condition. Symptoms of hypoglycemia, such as sleepiness, dizziness, and confusion, are similar to symptoms of having too much  alcohol.  Do not drink alcohol if: ? Your health care provider tells you not to drink. ? You are pregnant, may be pregnant, or are planning to become pregnant.  If you drink alcohol: ? Do not drink on an empty stomach. ? Limit how much you use to:  0-1 drink a day for women.  0-2 drinks a day for men. ? Be aware of how much alcohol is in your drink. In the [place] one drink equals one 12 oz bottle of beer (355 mL), one 5 oz glass of wine (148 mL), or one 1 oz glass of hard liquor (44 mL). ? Keep yourself hydrated with water, diet soda, or unsweetened iced tea.  Keep in mind that regular soda, juice, and other mixers may contain a lot of sugar and must be counted as carbs. What are tips for following this plan? Reading food labels  Start by checking the serving size on the "Nutrition Facts" label of packaged foods and drinks. The amount of calories, carbs, fats, and other nutrients listed on the label is based on one serving of the item. Many items contain more than one serving per package.  Check the total grams (g) of carbs in one serving. You can calculate the number of servings of carbs in one serving by dividing the total carbs by 15. For example, if a food has 30 g of total carbs per serving, it would be equal to 2 servings of carbs.  Check the number of grams (g) of saturated fats and trans fats in one serving. Choose foods that have   a low amount or none of these fats.  Check the number of milligrams (mg) of salt (sodium) in one serving. Most people should limit total sodium intake to less than 2,300 mg per day.  Always check the nutrition information of foods labeled as "low-fat" or "nonfat." These foods may be higher in added sugar or refined carbs and should be avoided.  Talk to your dietitian to identify your daily goals for nutrients listed on the label. Shopping  Avoid buying canned, pre-made, or processed foods. These foods tend to be high in fat, sodium, and added  sugar.  Shop around the outside edge of the grocery store. This is where you will most often find fresh fruits and vegetables, bulk grains, fresh meats, and fresh dairy. Cooking  Use low-heat cooking methods, such as baking, instead of high-heat cooking methods like deep frying.  Cook using healthy oils, such as olive, canola, or sunflower oil.  Avoid cooking with butter, cream, or high-fat meats. Meal planning  Eat meals and snacks regularly, preferably at the same times every day. Avoid going long periods of time without eating.  Eat foods that are high in fiber, such as fresh fruits, vegetables, beans, and whole grains. Talk with your dietitian about how many servings of carbs you can eat at each meal.  Eat 4-6 oz (112-168 g) of lean protein each day, such as lean meat, chicken, fish, eggs, or tofu. One ounce (oz) of lean protein is equal to: ? 1 oz (28 g) of meat, chicken, or fish. ? 1 egg. ?  cup (62 g) of tofu.  Eat some foods each day that contain healthy fats, such as avocado, nuts, seeds, and fish.   What foods should I eat? Fruits Berries. Apples. Oranges. Peaches. Apricots. Plums. Grapes. Mango. Papaya. Pomegranate. Kiwi. Cherries. Vegetables Lettuce. Spinach. Leafy greens, including kale, chard, collard greens, and mustard greens. Beets. Cauliflower. Cabbage. Broccoli. Carrots. Green beans. Tomatoes. Peppers. Onions. Cucumbers. Brussels sprouts. Grains Whole grains, such as whole-wheat or whole-grain bread, crackers, tortillas, cereal, and pasta. Unsweetened oatmeal. Quinoa. Brown or wild rice. Meats and other proteins Seafood. Poultry without skin. Lean cuts of poultry and beef. Tofu. Nuts. Seeds. Dairy Low-fat or fat-free dairy products such as milk, yogurt, and cheese. The items listed above may not be a complete list of foods and beverages you can eat. Contact a dietitian for more information. What foods should I avoid? Fruits Fruits canned with  syrup. Vegetables Canned vegetables. Frozen vegetables with butter or cream sauce. Grains Refined white flour and flour products such as bread, pasta, snack foods, and cereals. Avoid all processed foods. Meats and other proteins Fatty cuts of meat. Poultry with skin. Breaded or fried meats. Processed meat. Avoid saturated fats. Dairy Full-fat yogurt, cheese, or milk. Beverages Sweetened drinks, such as soda or iced tea. The items listed above may not be a complete list of foods and beverages you should avoid. Contact a dietitian for more information. Questions to ask a health care provider  Do I need to meet with a diabetes educator?  Do I need to meet with a dietitian?  What number can I call if I have questions?  When are the best times to check my blood glucose? Where to find more information:  American Diabetes Association: diabetes.org  Academy of Nutrition and Dietetics: www.eatright.org  National Institute of Diabetes and Digestive and Kidney Diseases: www.niddk.nih.gov  Association of Diabetes Care and Education Specialists: www.diabeteseducator.org Summary  It is important to have healthy eating   habits because your blood sugar (glucose) levels are greatly affected by what you eat and drink.  A healthy meal plan will help you control your blood glucose and maintain a healthy lifestyle.  Your health care provider may recommend that you work with a dietitian to make a meal plan that is best for you.  Keep in mind that carbohydrates (carbs) and alcohol have immediate effects on your blood glucose levels. It is important to count carbs and to use alcohol carefully. This information is not intended to replace advice given to you by your health care provider. Make sure you discuss any questions you have with your health care provider. Document Revised: 03/08/2019 Document Reviewed: 03/08/2019 Elsevier Patient Education  2021 Elsevier Inc.  

## 2021-01-09 NOTE — Assessment & Plan Note (Signed)
-  BP elevated in office, BP repeated and unchanged. Ambulatory BP readings stable. Will reassess BP at follow up visit. Recommend to continue with ambulatory BP monitoring. -Continue current medication regimen.

## 2021-01-09 NOTE — Assessment & Plan Note (Addendum)
-  A1c has improved from 6.7 to 6.3. -Continue current medication regimen. Patient will benefit from [place] for DM and CKD, recommend to re-consider. -Continue ambulatory glucose monitoring and notify clinic if FBS consistently <80 or >160. -Recommend to follow a low glucose/carbohydrate diet and stay as active as possible. -Will continue to monitor. -Patient plans to call his eye doctor and schedule annual eye exam.

## 2021-02-01 LAB — HM DIABETES EYE EXAM

## 2021-02-11 ENCOUNTER — Encounter: Payer: Self-pay | Admitting: Physician Assistant

## 2021-02-14 ENCOUNTER — Other Ambulatory Visit: Payer: Self-pay | Admitting: Physician Assistant

## 2021-02-22 ENCOUNTER — Other Ambulatory Visit: Payer: Self-pay | Admitting: Physician Assistant

## 2021-02-22 DIAGNOSIS — E1169 Type 2 diabetes mellitus with other specified complication: Secondary | ICD-10-CM

## 2021-03-14 ENCOUNTER — Other Ambulatory Visit: Payer: Self-pay | Admitting: Physician Assistant

## 2021-04-01 ENCOUNTER — Other Ambulatory Visit: Payer: Self-pay

## 2021-04-01 ENCOUNTER — Other Ambulatory Visit: Payer: BC Managed Care – PPO

## 2021-04-01 DIAGNOSIS — Z1329 Encounter for screening for other suspected endocrine disorder: Secondary | ICD-10-CM

## 2021-04-01 DIAGNOSIS — E1159 Type 2 diabetes mellitus with other circulatory complications: Secondary | ICD-10-CM

## 2021-04-01 DIAGNOSIS — Z0000 Encounter for general adult medical examination without abnormal findings: Secondary | ICD-10-CM

## 2021-04-01 DIAGNOSIS — E1169 Type 2 diabetes mellitus with other specified complication: Secondary | ICD-10-CM

## 2021-04-02 LAB — COMPREHENSIVE METABOLIC PANEL
ALT: 16 IU/L (ref 0–44)
AST: 17 IU/L (ref 0–40)
Albumin/Globulin Ratio: 1.7 (ref 1.2–2.2)
Albumin: 4.1 g/dL (ref 3.8–4.9)
Alkaline Phosphatase: 102 IU/L (ref 44–121)
BUN/Creatinine Ratio: 11 (ref 9–20)
BUN: 22 mg/dL (ref 6–24)
Bilirubin Total: 0.4 mg/dL (ref 0.0–1.2)
CO2: 23 mmol/L (ref 20–29)
Calcium: 9 mg/dL (ref 8.7–10.2)
Chloride: 103 mmol/L (ref 96–106)
Creatinine, Ser: 1.94 mg/dL — ABNORMAL HIGH (ref 0.76–1.27)
Globulin, Total: 2.4 g/dL (ref 1.5–4.5)
Glucose: 110 mg/dL — ABNORMAL HIGH (ref 70–99)
Potassium: 4.7 mmol/L (ref 3.5–5.2)
Sodium: 138 mmol/L (ref 134–144)
Total Protein: 6.5 g/dL (ref 6.0–8.5)
eGFR: 39 mL/min/{1.73_m2} — ABNORMAL LOW (ref >59)

## 2021-04-02 LAB — CBC WITH DIFFERENTIAL/PLATELET
Basophils Absolute: 0 10*3/uL (ref 0.0–0.2)
Basos: 0 %
EOS (ABSOLUTE): 0.1 10*3/uL (ref 0.0–0.4)
Eos: 2 %
Hematocrit: 43.4 % (ref 37.5–51.0)
Hemoglobin: 14.5 g/dL (ref 13.0–17.7)
Immature Grans (Abs): 0 10*3/uL (ref 0.0–0.1)
Immature Granulocytes: 1 %
Lymphocytes Absolute: 1.4 10*3/uL (ref 0.7–3.1)
Lymphs: 21 %
MCH: 29.7 pg (ref 26.6–33.0)
MCHC: 33.4 g/dL (ref 31.5–35.7)
MCV: 89 fL (ref 79–97)
Monocytes Absolute: 0.5 10*3/uL (ref 0.1–0.9)
Monocytes: 8 %
Neutrophils Absolute: 4.4 10*3/uL (ref 1.4–7.0)
Neutrophils: 68 %
Platelets: 208 10*3/uL (ref 150–450)
RBC: 4.88 x10E6/uL (ref 4.14–5.80)
RDW: 13.1 % (ref 11.6–15.4)
WBC: 6.4 10*3/uL (ref 3.4–10.8)

## 2021-04-02 LAB — LIPID PANEL
Chol/HDL Ratio: 2.9 ratio (ref 0.0–5.0)
Cholesterol, Total: 100 mg/dL (ref 100–199)
HDL: 34 mg/dL — ABNORMAL LOW (ref >39)
LDL Chol Calc (NIH): 45 mg/dL (ref 0–99)
Triglycerides: 111 mg/dL (ref 0–149)
VLDL Cholesterol Cal: 21 mg/dL (ref 5–40)

## 2021-04-02 LAB — TSH: TSH: 2.98 u[IU]/mL (ref 0.450–4.500)

## 2021-04-02 LAB — HEMOGLOBIN A1C
Est. average glucose Bld gHb Est-mCnc: 146 mg/dL
Hgb A1c MFr Bld: 6.7 % — ABNORMAL HIGH (ref 4.8–5.6)

## 2021-04-12 ENCOUNTER — Ambulatory Visit (INDEPENDENT_AMBULATORY_CARE_PROVIDER_SITE_OTHER): Payer: BC Managed Care – PPO | Admitting: Physician Assistant

## 2021-04-12 ENCOUNTER — Other Ambulatory Visit: Payer: Self-pay

## 2021-04-12 ENCOUNTER — Encounter: Payer: Self-pay | Admitting: Physician Assistant

## 2021-04-12 VITALS — BP 132/70 | HR 66 | Temp 97.6°F | Ht 70.0 in | Wt 257.0 lb

## 2021-04-12 DIAGNOSIS — E1159 Type 2 diabetes mellitus with other circulatory complications: Secondary | ICD-10-CM | POA: Diagnosis not present

## 2021-04-12 DIAGNOSIS — Z23 Encounter for immunization: Secondary | ICD-10-CM

## 2021-04-12 DIAGNOSIS — I152 Hypertension secondary to endocrine disorders: Secondary | ICD-10-CM

## 2021-04-12 DIAGNOSIS — Z0000 Encounter for general adult medical examination without abnormal findings: Secondary | ICD-10-CM

## 2021-04-12 DIAGNOSIS — Z1211 Encounter for screening for malignant neoplasm of colon: Secondary | ICD-10-CM

## 2021-04-12 DIAGNOSIS — E1169 Type 2 diabetes mellitus with other specified complication: Secondary | ICD-10-CM | POA: Diagnosis not present

## 2021-04-12 NOTE — Progress Notes (Signed)
Male physical   Impression and Recommendations:    1. Healthcare maintenance   2. Type 2 diabetes mellitus with other specified complication, without long-term current use of insulin ([place])   3. Hypertension associated with diabetes ([place])   4. Screening for colon cancer   5. Need for shingles vaccine      1) Anticipatory Guidance: Skin CA prevention- recommend to use sunscreen when outside along with skin surveillance; eat a balanced and modest diet; physical activity at least 25 minutes per day or minimum of 150 min/ week moderate to intense activity.  2) Immunizations / Screenings / Labs:   All immunizations are up-to-date per recommendations or will be updated today if pt allows.    - Patient understands with dental and vision screens they will schedule independently.  -Obtained CBC, CMP, HgA1c, Lipid panel, TSH when fasting.  Discussed with patient most recent lab results which are essentially stable from prior with the exception of A1c which has mildly increased from 6.3 to 6.7.  Discussed reducing sugar intake and increasing physical activity.  If A1c fails to improve on follow-up visit then recommend medication adjustments. -Patient agreeable to Shingrix and Cologuard. -Foot exam within normal limits.  3) Weight: Recommend to improve diet habits to improve overall feelings of well being and objective health data. Improve nutrient density of diet through increasing intake of fruits and vegetables and decreasing saturated fats, white flour products and refined sugars.   4) Healthcare maintenance: -Discussed improving diet and exercise. -Stay well-hydrated. -BP stable. -Continue current medication regimen. -Follow-up in 3 months for DM, HTN, CKD, second dose of shingles   Orders Placed This Encounter  Procedures   Varicella-zoster vaccine IM   Cologuard    No orders of the defined types were placed in this encounter.    Return in about 3 months (around 07/11/2021)  for DM, HTN, CKD, second dose of shingles  .    Gross side effects, risk and benefits, and alternatives of medications discussed with patient.  Patient is aware that all medications have potential side effects and we are unable to predict every side effect or drug-drug interaction that may occur.  Expresses verbal understanding and consents to current therapy plan and treatment regimen.  Please see AVS handed out to patient at the end of our visit for further patient instructions/ counseling done pertaining to today's office visit.       Subjective:        CC: CPE   HPI: Stanley Cook is a 60 y.o. male who presents to [place] at [facility] today for a yearly health maintenance exam.     Health Maintenance Summary  - Reviewed and updated, unless pt declines services.  Last Cologuard or Colonoscopy:   Patient declined colonoscopy but is agreeable to Cologuard Tobacco History Reviewed:   Yes, never been a smoker Abdominal Ultrasound:   N/A  CT scan for screening lung CA:   N/A    Alcohol / drug use:    No concerns, no excessive use / no use Exercise Habits: Currently has no routine exercise regimen Dental Home: Yes Eye exams: Yes Dermatology home: No  Male history: STD concerns:   none, monogamous Additional penile/ urinary concerns:   None   Additional concerns beyond Health Maintenance issues:   None    Immunization History  Administered Date(s) Administered   Influenza,inj,Quad PF,6+ Mos 12/25/2017, 11/27/2018, 01/09/2021   Influenza-Unspecified 12/13/2016, 12/25/2017, 01/05/2020   PFIZER(Purple Top)SARS-COV-2  Vaccination 06/11/2019, 07/02/2019   Pneumococcal Conjugate-13 11/16/2015   Tdap 12/17/2015   Zoster Recombinat (Shingrix) 04/12/2021     Health Maintenance  Topic Date Due   COLONOSCOPY (Pts 45-77yr Insurance coverage will need to be confirmed)  Never done   Pneumococcal Vaccine 13632Years old (2 - PPSV23 if available, else PCV20)  11/15/2016   COVID-19 Vaccine (3 - Pfizer risk series) 07/30/2019   Hepatitis C Screening  09/16/2021 (Originally 02/17/1979)   HIV Screening  09/15/2022 (Originally [birth year]17/1978)   Zoster Vaccines- Shingrix (2 of 2) 06/07/2021   HEMOGLOBIN A1C  09/30/2021   OPHTHALMOLOGY EXAM  02/01/2022   FOOT EXAM  04/12/2022   TETANUS/TDAP  12/16/2025   INFLUENZA VACCINE  Completed   HPV VACCINES  Aged Out       Wt Readings from Last 3 Encounters:  04/12/21 257 lb (116.6 kg)  01/09/21 238 lb (108 kg)  10/10/20 256 lb 6.4 oz (116.3 kg)   BP Readings from Last 3 Encounters:  04/12/21 132/70  01/09/21 (!) 143/78  10/10/20 132/74   Pulse Readings from Last 3 Encounters:  04/12/21 66  01/09/21 62  10/10/20 60    Patient Active Problem List   Diagnosis Date Noted   Right carotid bruit 10/10/2020   Mixed conductive and sensorineural hearing loss of both ears 03/05/2020   Excessive cerumen in both ear canals 03/05/2020   Type 2 diabetes mellitus without complication, without long-term current use of insulin ([place] 08/30/2018   History of non-ST elevation myocardial infarction (NSTEMI) 04/06/2018   CAD (coronary artery disease) 04/06/2018   PVC's (premature ventricular contractions) 133/54/5625  Systolic murmur 163/89/3734  Foot pain, right 08/11/2017   Acute right ankle pain 08/11/2017   Vitamin D deficiency 05/04/2017   Encounter for routine adult physical exam with abnormal findings 03/04/2017   Chronic kidney disease 12/04/2016   H/O hypertensive crisis 09/30/2016   Dyslipidemia 09/30/2016   Diabetes mellitus ([place] 09/30/2016   Health care maintenance 08/19/2016   Hypertension associated with diabetes ([place] 08/19/2016   Obesity, diabetes, and hypertension syndrome ([place] 08/19/2016    Past Medical History:  Diagnosis Date   Diabetes mellitus type II, non insulin dependent ([place]    Hyperlipidemia    Hypertension     History reviewed. No pertinent surgical history.  Family  History  Problem Relation Age of Onset   Unexplained death Father 786      Natural causes, no autopsy   Diabetes Sister     Social History   Substance and Sexual Activity  Drug Use No  ,  Social History   Substance and Sexual Activity  Alcohol Use No  ,  Social History   Tobacco Use  Smoking Status Never  Smokeless Tobacco Never  ,  Social History   Substance and Sexual Activity  Sexual Activity Not on file    Patient's Medications  New Prescriptions   No medications on file  Previous Medications   AMLODIPINE (NORVASC) 10 MG TABLET    TAKE 1 TABLET(10 MG) BY MOUTH DAILY   ASPIRIN EC 81 MG TABLET    Take by mouth.   ATORVASTATIN (LIPITOR) 40 MG TABLET    TAKE 1 TABLET(40 MG) BY MOUTH AT BEDTIME   BLOOD GLUCOSE METER KIT AND SUPPLIES KIT    Dispense based on patient and insurance preference. Use up to four times daily as directed. (FOR ICD-9 250.00, 250.01).   FUROSEMIDE (LASIX) 40 MG TABLET    Take 40 mg by  mouth. One tablet three times weekly as needed   GLUCOSE BLOOD TEST STRIP    Use to check blood sugars twice daily   INSULIN PEN NEEDLE (B-D ULTRAFINE III SHORT PEN) 31G X 8 MM MISC    USE DAILY   METOPROLOL SUCCINATE (TOPROL-XL) 100 MG 24 HR TABLET    Take 100 mg by mouth daily.   RAMIPRIL (ALTACE) 10 MG CAPSULE    Take 1 capsule (10 mg total) by mouth daily. Future refills from Nephrology   SODIUM BICARBONATE 650 MG TABLET    Take 650 mg by mouth daily.   VICTOZA 18 MG/3ML SOPN    ADMINISTER 0.6 MG UNDER THE SKIN EVERY DAY  Modified Medications   No medications on file  Discontinued Medications   No medications on file    Penicillins  Review of Systems: General:   Denies fever, chills, unexplained weight loss.  Optho/Auditory:   Denies visual changes, blurred vision/LOV Respiratory:   Denies SOB, DOE more than baseline levels.   Cardiovascular:   Denies chest pain, palpitations, new onset peripheral edema  Gastrointestinal:   Denies nausea, vomiting,  diarrhea.  Genitourinary: Denies dysuria, freq/ urgency, flank pain Endocrine:     Denies hot or cold intolerance, polyuria, polydipsia. Musculoskeletal:   Denies unexplained myalgias, joint swelling, unexplained arthralgias, gait problems.  Skin:  Denies rash, suspicious lesions Neurological:     Denies dizziness, unexplained weakness, numbness  Psychiatric/Behavioral:   Denies delusions, suicidal or homicidal ideations, hallucinations    Objective:     Blood pressure 132/70, pulse 66, temperature 97.6 F (36.4 C), height 5' 10"  (1.778 m), weight 257 lb (116.6 kg), SpO2 98 %. Body mass index is 36.88 kg/m. General Appearance:    Alert, cooperative, no distress, appears stated age  Head:    Normocephalic, without obvious abnormality, atraumatic  Eyes:    PERRL, conjunctiva/corneas clear, EOM's intact, fundi    benign, both eyes  Ears:    Normal TM's and external ear canals, both ears  Nose:   Nares normal, septum midline, mucosa normal, no drainage    or sinus tenderness  Throat:   Lips w/o lesion, mucosa moist, and tongue normal; teeth and gums normal  Neck:   Supple, symmetrical, trachea midline, no adenopathy;    thyroid:  no enlargement/tenderness/nodules, no JVD  Back:     Symmetric, no curvature, ROM normal, no CVA tenderness  Lungs:     Clear to auscultation bilaterally, respirations unlabored, no  Wh/ R/ R  Chest Wall:    No tenderness or gross deformity; normal excursion   Heart:    Regular rate and rhythm, S1 and S2 normal, no murmur, rub   or gallop  Abdomen:     Soft, non-tender, bowel sounds active all four quadrants, No G/R/R, no masses, no organomegaly  Genitalia:  Deferred  Rectal:  Deferred  Extremities:   Extremities normal, atraumatic, no cyanosis or gross edema  Pulses:   2+ and symmetric all extremities  Skin:   Warm, dry, Skin color, texture, turgor normal, no obvious rashes or lesions, scattered SK and solar lentigo lesions  M-Sk:   Ambulates * 4 w/o  difficulty, no gross deformities, tone WNL  Neurologic:   CNII-XII grossly intact Psych:  No HI/SI, judgement and insight good, Euthymic mood. Full Affect.

## 2021-04-12 NOTE — Patient Instructions (Signed)

## 2021-04-24 LAB — RENAL FUNCTION PANEL
Albumin: 4.5
BUN/Creatinine Ratio: 13
BUN: 29
Calcium: 9.3
Carbon Dioxide, Total: 23
Chloride: 103
Creat: 2.23
Glucose: 135
Phosphorus: 3.9
Potassium: 5
Sodium: 141
eGFR: 33

## 2021-05-05 LAB — COLOGUARD: COLOGUARD: NEGATIVE

## 2021-05-15 ENCOUNTER — Other Ambulatory Visit: Payer: Self-pay | Admitting: Physician Assistant

## 2021-05-20 ENCOUNTER — Encounter: Payer: Self-pay | Admitting: Physician Assistant

## 2021-06-26 ENCOUNTER — Other Ambulatory Visit: Payer: Self-pay | Admitting: Physician Assistant

## 2021-06-26 DIAGNOSIS — E1169 Type 2 diabetes mellitus with other specified complication: Secondary | ICD-10-CM

## 2021-07-08 NOTE — Progress Notes (Signed)
?Established patient visit ? ? ?Patient: Stanley Cook   DOB: 29-Oct-1960   61 y.o. Male  MRN: [id number] ?Visit Date: 07/09/2021 ? ?Chief Complaint  ?Patient presents with  ? Follow-up  ? Diabetes  ? Hypertension  ? Chronic Kidney Disease  ? ?Subjective  ?  ?HPI  ?Patient presents for follow up on diabetes mellitus, hypertension and hyperlipidemia. Patient is due for second dose of Shingrix. ? ?Diabetes mellitus: Pt denies increased urination or thirst. Pt reports medication compliance. No hypoglycemic events. Checking glucose at home. FBS have been between 125-145, states it was 140 yesterday. Reports has reduced bananas.  ? ?HTN: Pt denies chest pain, palpitations, dizziness or shortness of breath. Occasionally has lower extremity swelling, takes his water pill once a week. Taking medication as directed without side effects.  ? ?HLD: Pt taking medication as directed without issues.  ? ?CKD: Followed by nephrology. States his kidney doctor recently started him on a Vitamin d supplement.  ? ?Medications: ?Outpatient Medications Prior to Visit  ?Medication Sig  ? amLODipine (NORVASC) 10 MG tablet TAKE 1 TABLET(10 MG) BY MOUTH DAILY  ? aspirin EC 81 MG tablet Take by mouth.  ? atorvastatin (LIPITOR) 40 MG tablet TAKE 1 TABLET(40 MG) BY MOUTH AT BEDTIME  ? blood glucose meter kit and supplies KIT Dispense based on patient and insurance preference. Use up to four times daily as directed. (FOR ICD-9 250.00, 250.01).  ? cholecalciferol (VITAMIN D3) 25 MCG (1000 UNIT) tablet Take 1,000 Units by mouth daily.  ? furosemide (LASIX) 40 MG tablet Take 40 mg by mouth. One tablet three times weekly as needed  ? glucose blood test strip Use to check blood sugars twice daily  ? Insulin Pen Needle (B-D ULTRAFINE III SHORT PEN) 31G X 8 MM MISC USE DAILY  ? metoprolol succinate (TOPROL-XL) 100 MG 24 hr tablet Take 100 mg by mouth daily.  ? ramipril (ALTACE) 10 MG capsule Take 1 capsule (10 mg total) by mouth daily. Future refills from  Nephrology  ? sodium bicarbonate 650 MG tablet Take 650 mg by mouth daily.  ? VICTOZA 18 MG/3ML SOPN ADMINISTER 0.6 MG UNDER THE SKIN EVERY DAY  ? ?No facility-administered medications prior to visit.  ? ? ?Review of Systems ?Review of Systems:  ?A fourteen system review of systems was performed and found to be positive as per HPI. ? ? ?  Objective  ?  ?BP 117/77   Pulse 60   Temp 98.3 ?F (36.8 ?C)   Ht 5' 9"  (1.753 m)   Wt 258 lb (117 kg)   SpO2 98%   BMI 38.10 kg/m?  ?BP Readings from Last 3 Encounters:  ?07/09/21 117/77  ?04/12/21 132/70  ?01/09/21 (!) 143/78  ? ?Wt Readings from Last 3 Encounters:  ?07/09/21 258 lb (117 kg)  ?04/12/21 257 lb (116.6 kg)  ?01/09/21 238 lb (108 kg)  ? ? ?Physical Exam  ?General:  Pleasant and cooperative, appropriate for stated age.  ?Neuro:  Alert and oriented,  extra-ocular muscles intact  ?HEENT:  Normocephalic, atraumatic, neck supple  ?Skin:  no gross rash, warm, pink. ?Cardiac:  RRR, S1 S2 ?Respiratory: CTA B/L  ?Vascular:  Ext warm, no cyanosis apprec.; cap RF less 2 sec. ?Psych:  No HI/SI, judgement and insight good, Euthymic mood. Full Affect. ? ? ?Results for orders placed or performed in visit on 07/09/21  ?POCT glycosylated hemoglobin (Hb A1C)  ?Result Value Ref Range  ? Hemoglobin A1C 6.4 (A) 4.0 - 5.6 %  ?  HbA1c POC (<> result, manual entry)    ? HbA1c, POC (prediabetic range)    ? HbA1c, POC (controlled diabetic range)    ? ? Assessment & Plan  ?  ? ? ?Problem List Items Addressed This Visit   ? ?  ? Cardiovascular and Mediastinum  ? Hypertension associated with diabetes ([place]) (Chronic)  ?  -BP in office at goal. Continue current medication regimen. Discussed to continue low sodium diet. Will continue to monitor. ?  ?  ?  ? Endocrine  ? Diabetes mellitus ([place]) - Primary  ?  -A1c has improved from  6.7 to 6.4, continue current medication regimen. See med list. Recommend to continue with ambulatory glucose monitoring. Discussed diabetic diet. Will continue to  monitor. ?  ?  ? Relevant Orders  ? POCT glycosylated hemoglobin (Hb A1C) (Completed)  ? Hyperlipidemia associated with type 2 diabetes mellitus ([place])  ?  -Last lipid panel: HDL 34, LDL 45 ?-Discussed increasing physical activity and low fat diet. Continue current medication regimen. Will continue to monitor. ?  ?  ?  ? Genitourinary  ? Chronic kidney disease (Chronic)  ?  -Followed by [facility]. ?-Last CMP; Cr 2.230, eGFR 33 ?  ?  ? ?Other Visit Diagnoses   ? ? Need for shingles vaccine      ? Relevant Orders  ? Varicella-zoster vaccine IM (Completed)  ? ?  ? ? ?Return in about 4 months (around 11/08/2021) for DM, HTN, HLD.  ?   ? ? ? ?Lorrene Reid, PA-C  ?[facility] ?[phone] (phone) ?[phone] (fax) ? ?[facility] ?

## 2021-07-09 ENCOUNTER — Ambulatory Visit (INDEPENDENT_AMBULATORY_CARE_PROVIDER_SITE_OTHER): Payer: BC Managed Care – PPO | Admitting: Physician Assistant

## 2021-07-09 ENCOUNTER — Encounter: Payer: Self-pay | Admitting: Physician Assistant

## 2021-07-09 ENCOUNTER — Other Ambulatory Visit: Payer: Self-pay

## 2021-07-09 VITALS — BP 117/77 | HR 60 | Temp 98.3°F | Ht 69.0 in | Wt 258.0 lb

## 2021-07-09 DIAGNOSIS — Z23 Encounter for immunization: Secondary | ICD-10-CM

## 2021-07-09 DIAGNOSIS — E1159 Type 2 diabetes mellitus with other circulatory complications: Secondary | ICD-10-CM | POA: Diagnosis not present

## 2021-07-09 DIAGNOSIS — N1832 Chronic kidney disease, stage 3b: Secondary | ICD-10-CM | POA: Diagnosis not present

## 2021-07-09 DIAGNOSIS — E1169 Type 2 diabetes mellitus with other specified complication: Secondary | ICD-10-CM

## 2021-07-09 DIAGNOSIS — E785 Hyperlipidemia, unspecified: Secondary | ICD-10-CM

## 2021-07-09 DIAGNOSIS — I152 Hypertension secondary to endocrine disorders: Secondary | ICD-10-CM

## 2021-07-09 LAB — POCT GLYCOSYLATED HEMOGLOBIN (HGB A1C): Hemoglobin A1C: 6.4 % — AB (ref 4.0–5.6)

## 2021-07-09 NOTE — Assessment & Plan Note (Signed)
-  A1c has improved from  6.7 to 6.4, continue current medication regimen. See med list. Recommend to continue with ambulatory glucose monitoring. Discussed diabetic diet. Will continue to monitor. ?

## 2021-07-09 NOTE — Assessment & Plan Note (Addendum)
-  Last lipid panel: HDL 34, LDL 45 ?-Discussed increasing physical activity and low fat diet. Continue current medication regimen. Will continue to monitor. ?

## 2021-07-09 NOTE — Patient Instructions (Signed)

## 2021-07-09 NOTE — Assessment & Plan Note (Signed)
-  BP in office at goal. Continue current medication regimen. Discussed to continue low sodium diet. Will continue to monitor. ?

## 2021-07-09 NOTE — Assessment & Plan Note (Signed)
>>  ASSESSMENT AND PLAN FOR DIABETES MELLITUS WRITTEN ON 07/09/2021  4:36 PM BY ABONZA, MARITZA, PA-C  -A1c has improved from  6.7 to 6.4, continue current medication regimen. See med list. Recommend to continue with ambulatory glucose monitoring. Discussed diabetic diet. Will continue to monitor.

## 2021-07-09 NOTE — Assessment & Plan Note (Signed)
-  Followed by [facility]. ?-Last CMP; Cr 2.230, eGFR 33 ?

## 2021-08-28 ENCOUNTER — Other Ambulatory Visit: Payer: Self-pay | Admitting: Physician Assistant

## 2021-08-28 DIAGNOSIS — E119 Type 2 diabetes mellitus without complications: Secondary | ICD-10-CM

## 2021-10-24 ENCOUNTER — Telehealth: Payer: Self-pay | Admitting: Physician Assistant

## 2021-10-24 ENCOUNTER — Other Ambulatory Visit: Payer: Self-pay

## 2021-10-24 MED ORDER — DAPAGLIFLOZIN PROPANEDIOL 10 MG PO TABS: 10.0000 mg | ORAL_TABLET | Freq: Every day | ORAL | 0 refills | Status: DC

## 2021-10-24 MED ORDER — RAMIPRIL 10 MG PO CAPS: 10.0000 mg | ORAL_CAPSULE | Freq: Every day | ORAL | 3 refills | Status: DC

## 2021-10-24 NOTE — Telephone Encounter (Signed)
Patient went to his nephrologist yesterday and wanted you to know they have cut Ramipril to 5 mg, discontinued sodium bicarbonate and added Farxiga 10 mg.

## 2021-10-24 NOTE — Telephone Encounter (Signed)
Med list updated. AS, CMA

## 2021-11-08 ENCOUNTER — Ambulatory Visit (INDEPENDENT_AMBULATORY_CARE_PROVIDER_SITE_OTHER): Payer: BC Managed Care – PPO | Admitting: Physician Assistant

## 2021-11-08 ENCOUNTER — Encounter: Payer: Self-pay | Admitting: Physician Assistant

## 2021-11-08 VITALS — BP 130/69 | HR 56 | Ht 68.9 in | Wt 255.4 lb

## 2021-11-08 DIAGNOSIS — E785 Hyperlipidemia, unspecified: Secondary | ICD-10-CM | POA: Diagnosis not present

## 2021-11-08 DIAGNOSIS — N1832 Chronic kidney disease, stage 3b: Secondary | ICD-10-CM | POA: Diagnosis not present

## 2021-11-08 DIAGNOSIS — E1169 Type 2 diabetes mellitus with other specified complication: Secondary | ICD-10-CM

## 2021-11-08 DIAGNOSIS — I152 Hypertension secondary to endocrine disorders: Secondary | ICD-10-CM

## 2021-11-08 DIAGNOSIS — E1159 Type 2 diabetes mellitus with other circulatory complications: Secondary | ICD-10-CM

## 2021-11-08 DIAGNOSIS — R609 Edema, unspecified: Secondary | ICD-10-CM

## 2021-11-08 LAB — POCT GLYCOSYLATED HEMOGLOBIN (HGB A1C): Hemoglobin A1C: 6.8 % — AB (ref 4.0–5.6)

## 2021-11-08 LAB — POCT UA - MICROALBUMIN
Albumin/Creatinine Ratio, Urine, POC: <30
Creatinine, POC: 200 mg/dL
Microalbumin Ur, POC: 30 mg/L

## 2021-11-08 NOTE — Addendum Note (Signed)
Addended by: Thad Ranger on: 11/08/2021 09:53 AM   Modules accepted: Orders

## 2021-11-08 NOTE — Assessment & Plan Note (Signed)
-  A1c today 6.8, mildly increased from 6.4 (at goal<7.0). Patient recently started on Farxiga 10 mg by nephrology for CKD which can also help treat DM so medication changes, will continue current regimen. Will collect urine microalbumin. Will continue to monitor.

## 2021-11-08 NOTE — Assessment & Plan Note (Signed)
>>  ASSESSMENT AND PLAN FOR DIABETES MELLITUS WRITTEN ON 11/08/2021  9:33 AM BY ABONZA, MARITZA, PA-C  -A1c today 6.8, mildly increased from 6.4 (at goal<7.0). Patient recently started on Farxiga 10 mg by nephrology for CKD which can also help treat DM so medication changes, will continue current regimen. Will collect urine microalbumin. Will continue to monitor.

## 2021-11-08 NOTE — Progress Notes (Signed)
Established patient visit   Patient: Stanley Cook   DOB: 14-Jul-1960   61 y.o. Male  MRN: [id number] Visit Date: 11/08/2021  Chief Complaint  Patient presents with   Follow-up   Diabetes   Subjective    HPI  Patient presents for chronic follow-up visit.   Diabetes: Pt denies increased urination or thirst. Pt reports medication compliance. No hypoglycemic events. Checking glucose at home. FBS range 130s.  HTN: Pt denies chest pain, palpitations, dizziness or syncope. Lower extremity swelling has been stable. Taking medication as directed without side effects. Checks BP at home and readings range in 124/76. Pt follows a low salt diet.  HLD: Pt taking medication as directed without issues. No myalgias. States recently went on a trip so he diet was not as good. Eating more at restaurants.     Medications: Outpatient Medications Prior to Visit  Medication Sig   amLODipine (NORVASC) 10 MG tablet TAKE 1 TABLET(10 MG) BY MOUTH DAILY   aspirin EC 81 MG tablet Take by mouth.   atorvastatin (LIPITOR) 40 MG tablet TAKE 1 TABLET(40 MG) BY MOUTH AT BEDTIME   B-D ULTRAFINE III SHORT PEN 31G X 8 MM MISC USE DAILY   blood glucose meter kit and supplies KIT Dispense based on patient and insurance preference. Use up to four times daily as directed. (FOR ICD-9 250.00, 250.01).   cholecalciferol (VITAMIN D3) 25 MCG (1000 UNIT) tablet Take 1,000 Units by mouth daily.   dapagliflozin propanediol (FARXIGA) 10 MG TABS tablet Take 1 tablet (10 mg total) by mouth daily before breakfast.   furosemide (LASIX) 40 MG tablet Take 40 mg by mouth. One tablet three times weekly as needed   glucose blood test strip Use to check blood sugars twice daily   metoprolol succinate (TOPROL-XL) 100 MG 24 hr tablet Take 100 mg by mouth daily.   ramipril (ALTACE) 5 MG capsule Take 5 mg by mouth daily.   sodium bicarbonate 650 MG tablet Take 650 mg by mouth daily.   VICTOZA 18 MG/3ML SOPN ADMINISTER 0.6 MG UNDER THE SKIN  EVERY DAY   [DISCONTINUED] ramipril (ALTACE) 10 MG capsule Take 1 capsule (10 mg total) by mouth daily. TAKING HALF Future refills from Nephrology   No facility-administered medications prior to visit.    Review of Systems Review of Systems:  A fourteen system review of systems was performed and found to be positive as per HPI.  Last CBC Lab Results  Component Value Date   WBC 6.4 04/01/2021   HGB 14.5 04/01/2021   HCT 43.4 04/01/2021   MCV 89 04/01/2021   MCH 29.7 04/01/2021   RDW 13.1 04/01/2021   PLT 208 02/54/2706   Last metabolic panel Lab Results  Component Value Date   GLUCOSE 110 (H) 04/01/2021   NA 141 04/23/2021   K 5.0 04/23/2021   CL 103 04/23/2021   CO2 23 04/23/2021   BUN 29 04/23/2021   CREATININE 2.23 04/23/2021   EGFR 33 04/23/2021   CALCIUM 9.3 04/23/2021   PHOS 3.9 04/23/2021   PROT 6.5 04/01/2021   ALBUMIN 4.5 04/23/2021   LABGLOB 2.4 04/01/2021   AGRATIO 1.7 04/01/2021   BILITOT 0.4 04/01/2021   ALKPHOS 102 04/01/2021   AST 17 04/01/2021   ALT 16 04/01/2021   Last lipids Lab Results  Component Value Date   CHOL 100 04/01/2021   HDL 34 (L) 04/01/2021   LDLCALC 45 04/01/2021   TRIG 111 04/01/2021   CHOLHDL 2.9 04/01/2021  Last hemoglobin A1c Lab Results  Component Value Date   HGBA1C 6.8 (A) 11/08/2021   Last thyroid functions Lab Results  Component Value Date   TSH 2.980 04/01/2021   Last vitamin D Lab Results  Component Value Date   VD25OH 32.0 04/01/2018   Last vitamin B12 and Folate No results found for: "VITAMINB12", "FOLATE"   Objective    BP 130/69   Pulse (!) 56   Ht 5' 8.9" (1.75 m)   Wt 255 lb 6.4 oz (115.8 kg)   SpO2 97%   BMI 37.83 kg/m  BP Readings from Last 3 Encounters:  11/08/21 130/69  07/09/21 117/77  04/12/21 132/70   Wt Readings from Last 3 Encounters:  11/08/21 255 lb 6.4 oz (115.8 kg)  07/09/21 258 lb (117 kg)  04/12/21 257 lb (116.6 kg)    Physical Exam  General:  Well Developed, well  nourished, appropriate for stated age.  Neuro:  Alert and oriented,  extra-ocular muscles intact  HEENT:  Normocephalic, atraumatic, neck supple  Skin:  no gross rash, warm, pink. Cardiac:  RRR, S1 S2 Respiratory: CTA B/L  Vascular:  Ext warm, no cyanosis apprec.; cap RF less 2 sec. 1+ pitting edema b/l Psych:  No HI/SI, judgement and insight good, Euthymic mood. Full Affect.   Results for orders placed or performed in visit on 11/08/21  POCT glycosylated hemoglobin (Hb A1C)  Result Value Ref Range   Hemoglobin A1C 6.8 (A) 4.0 - 5.6 %   HbA1c POC (<> result, manual entry)     HbA1c, POC (prediabetic range)     HbA1c, POC (controlled diabetic range)      Assessment & Plan      Problem List Items Addressed This Visit       Cardiovascular and Mediastinum   Hypertension associated with diabetes ([place]) (Chronic)    -Stable. Continue current medication regimen. Will collect CMP for medication monitoring. Will continue to monitor.      Relevant Medications   ramipril (ALTACE) 5 MG capsule   Other Relevant Orders   POCT glycosylated hemoglobin (Hb A1C) (Completed)   CBC w/Diff     Endocrine   Diabetes mellitus ([place]) - Primary    -A1c today 6.8, mildly increased from 6.4 (at goal<7.0). Patient recently started on Farxiga 10 mg by nephrology for CKD which can also help treat DM so medication changes, will continue current regimen. Will collect urine microalbumin. Will continue to monitor.      Relevant Medications   ramipril (ALTACE) 5 MG capsule   Other Relevant Orders   POCT glycosylated hemoglobin (Hb A1C) (Completed)   CBC w/Diff   Comp Met (CMET)   Hyperlipidemia associated with type 2 diabetes mellitus (H[facility])    -Last lipid panel: LDL 45 (at goal<70), HDL 34 -Continue current medication regimen.  -Will collect lipid panel and hepatic function today. -Will continue to monitor.      Relevant Medications   ramipril (ALTACE) 5 MG capsule   Other Relevant Orders   POCT  glycosylated hemoglobin (Hb A1C) (Completed)   CBC w/Diff   Comp Met (CMET)   Lipid Profile     Genitourinary   Chronic kidney disease (Chronic)    -Followed by [facility].  -Recently started on Farxiga 10 mg daily which I recommend he continues.  -Will collect CMP today.      Other Visit Diagnoses     Peripheral edema          Peripheral edema: -Recommend to follow a  low sodium diet, elevation and Lasix 40 mg as needed and recommended by nephrology.   Return in about 5 months (around 04/10/2022) for CPE and FBW.       Lorrene Reid, PA-C  [facility] at [facility] [phone] (phone) [phone] (fax)  [place]

## 2021-11-08 NOTE — Patient Instructions (Signed)

## 2021-11-08 NOTE — Assessment & Plan Note (Addendum)
-  Last lipid panel: LDL 45 (at goal<70), HDL 34 -Continue current medication regimen.  -Will collect lipid panel and hepatic function today. -Will continue to monitor.

## 2021-11-08 NOTE — Assessment & Plan Note (Signed)
-  Stable. -Continue current medication regimen. -Will collect CMP for medication monitoring. -Will continue to monitor. 

## 2021-11-08 NOTE — Assessment & Plan Note (Signed)
-  Followed by [facility].  -Recently started on Farxiga 10 mg daily which I recommend he continues.  -Will collect CMP today.

## 2021-11-09 LAB — COMPREHENSIVE METABOLIC PANEL
ALT: 17 IU/L (ref 0–44)
AST: 22 IU/L (ref 0–40)
Albumin/Globulin Ratio: 2 (ref 1.2–2.2)
Albumin: 4.4 g/dL (ref 3.8–4.9)
Alkaline Phosphatase: 105 IU/L (ref 44–121)
BUN/Creatinine Ratio: 12 (ref 9–20)
BUN: 28 mg/dL — ABNORMAL HIGH (ref 6–24)
Bilirubin Total: 0.5 mg/dL (ref 0.0–1.2)
CO2: 20 mmol/L (ref 20–29)
Calcium: 10.1 mg/dL (ref 8.7–10.2)
Chloride: 98 mmol/L (ref 96–106)
Creatinine, Ser: 2.34 mg/dL — ABNORMAL HIGH (ref 0.76–1.27)
Globulin, Total: 2.2 g/dL (ref 1.5–4.5)
Glucose: 117 mg/dL — ABNORMAL HIGH (ref 70–99)
Potassium: 4.6 mmol/L (ref 3.5–5.2)
Sodium: 139 mmol/L (ref 134–144)
Total Protein: 6.6 g/dL (ref 6.0–8.5)
eGFR: 31 mL/min/{1.73_m2} — ABNORMAL LOW (ref >59)

## 2021-11-09 LAB — CBC WITH DIFFERENTIAL/PLATELET
Basophils Absolute: 0 10*3/uL (ref 0.0–0.2)
Basos: 1 %
EOS (ABSOLUTE): 0.1 10*3/uL (ref 0.0–0.4)
Eos: 2 %
Hematocrit: 46.2 % (ref 37.5–51.0)
Hemoglobin: 15.2 g/dL (ref 13.0–17.7)
Immature Grans (Abs): 0 10*3/uL (ref 0.0–0.1)
Immature Granulocytes: 1 %
Lymphocytes Absolute: 1.7 10*3/uL (ref 0.7–3.1)
Lymphs: 22 %
MCH: 29.7 pg (ref 26.6–33.0)
MCHC: 32.9 g/dL (ref 31.5–35.7)
MCV: 90 fL (ref 79–97)
Monocytes Absolute: 0.7 10*3/uL (ref 0.1–0.9)
Monocytes: 9 %
Neutrophils Absolute: 5.2 10*3/uL (ref 1.4–7.0)
Neutrophils: 65 %
Platelets: 232 10*3/uL (ref 150–450)
RBC: 5.11 x10E6/uL (ref 4.14–5.80)
RDW: 13.5 % (ref 11.6–15.4)
WBC: 7.7 10*3/uL (ref 3.4–10.8)

## 2021-11-09 LAB — LIPID PANEL
Chol/HDL Ratio: 2.9 ratio (ref 0.0–5.0)
Cholesterol, Total: 112 mg/dL (ref 100–199)
HDL: 39 mg/dL — ABNORMAL LOW (ref >39)
LDL Chol Calc (NIH): 54 mg/dL (ref 0–99)
Triglycerides: 99 mg/dL (ref 0–149)
VLDL Cholesterol Cal: 19 mg/dL (ref 5–40)

## 2021-11-13 ENCOUNTER — Other Ambulatory Visit: Payer: Self-pay | Admitting: Cardiovascular Disease

## 2021-11-13 NOTE — Telephone Encounter (Signed)
Rx(s) sent to pharmacy electronically.  

## 2021-11-21 LAB — HM DIABETES EYE EXAM

## 2021-12-07 ENCOUNTER — Other Ambulatory Visit: Payer: Self-pay | Admitting: Physician Assistant

## 2021-12-24 ENCOUNTER — Ambulatory Visit: Payer: BC Managed Care – PPO | Attending: Cardiovascular Disease | Admitting: Cardiovascular Disease

## 2021-12-24 ENCOUNTER — Encounter: Payer: Self-pay | Admitting: Cardiovascular Disease

## 2021-12-24 DIAGNOSIS — E1159 Type 2 diabetes mellitus with other circulatory complications: Secondary | ICD-10-CM | POA: Diagnosis not present

## 2021-12-24 DIAGNOSIS — I252 Old myocardial infarction: Secondary | ICD-10-CM | POA: Diagnosis not present

## 2021-12-24 DIAGNOSIS — I152 Hypertension secondary to endocrine disorders: Secondary | ICD-10-CM | POA: Diagnosis not present

## 2021-12-24 DIAGNOSIS — E785 Hyperlipidemia, unspecified: Secondary | ICD-10-CM

## 2021-12-24 NOTE — Assessment & Plan Note (Signed)
History of essential hypertension blood pressure measured today at 142/68.  He is on amlodipine metoprolol and ramipril.

## 2021-12-24 NOTE — Assessment & Plan Note (Signed)
>>  ASSESSMENT AND PLAN FOR DYSLIPIDEMIA WRITTEN ON 12/24/2021  9:24 AM BY BERRY, Pearletha Forge, MD  History of dyslipidemia on statin therapy with lipid profile performed 11/08/2021 revealing total cholesterol 112, LDL 54 and HDL of 39.

## 2021-12-24 NOTE — Progress Notes (Signed)
12/24/2021 Stanley Cook   1960/07/06  [id number]  Primary Physician Stanley Reid, PA-C Primary Cardiologist: Stanley Harp MD Stanley Cook, [place]  HPI:  Stanley Cook is a 61 y.o.  single male with no children who works as as 6 Journalist, newspaper at [employer]..  His primary care provider is Dr. Lorrene Cook.    I last saw him in the office 10/10/2020. He has a history of treated hypertension, and hyperlipidemia.  He also has diabetes and chronic kidney disease with a serum creatinine in the 2 range.  He does not smoke.  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  He did have a 2D echo performed 04/15/2018 that revealed normal LV function.  He denies chest pain or shortness of breath.   Since I saw him a year ago he continues to do well.  He is completely asymptomatic.  He did have recent blood work performed by his PCP on 10/31/2021 revealed total cholesterol 112, LDL 54 and HDL 39.   Current Meds  Medication Sig   amLODipine (NORVASC) 10 MG tablet TAKE 1 TABLET(10 MG) BY MOUTH DAILY   aspirin EC 81 MG tablet Take by mouth.   atorvastatin (LIPITOR) 40 MG tablet TAKE 1 TABLET(40 MG) BY MOUTH AT BEDTIME   B-D ULTRAFINE III SHORT PEN 31G X 8 MM MISC USE DAILY   blood glucose meter kit and supplies KIT Dispense based on patient and insurance preference. Use up to four times daily as directed. (FOR ICD-9 250.00, 250.01).   cholecalciferol (VITAMIN D3) 25 MCG (1000 UNIT) tablet Take 1,000 Units by mouth daily.   dapagliflozin propanediol (FARXIGA) 10 MG TABS tablet Take 1 tablet (10 mg total) by mouth daily before breakfast.   furosemide (LASIX) 40 MG tablet Take 40 mg by mouth. One tablet three times weekly as needed   glucose blood test strip Use to check blood sugars twice daily   metoprolol succinate (TOPROL-XL) 100 MG 24 hr tablet Take 100 mg by mouth daily.   ramipril (ALTACE) 5 MG capsule Take 5 mg by mouth daily.    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   Social Determinants of Health   Financial Resource Strain: Not on file  Food Insecurity: Not on file  Transportation Needs: Not on file  Physical Activity: Not on file  Stress: Not on file  Social Connections: Not on file  Intimate Partner Violence: Not on file     Review of Systems: General: negative for chills, fever, night sweats or weight changes.  Cardiovascular: negative for chest pain, dyspnea on exertion, edema, orthopnea, palpitations, paroxysmal  nocturnal dyspnea or shortness of breath Dermatological: negative for rash Respiratory: negative for cough or wheezing Urologic: negative for hematuria Abdominal: negative for nausea, vomiting, diarrhea, bright red blood per rectum, melena, or hematemesis Neurologic: negative for visual changes, syncope, or dizziness All other systems reviewed and are otherwise negative except as noted above.    Blood pressure (!) 142/68, pulse 67, resp. rate (!) 96, height 5' 9"  (1.753 m), weight 252 lb 6.4 oz (114.5 kg).  General appearance: alert and no distress Neck: no adenopathy, no carotid bruit, no JVD, supple, symmetrical, trachea midline, and thyroid not enlarged, symmetric, no tenderness/mass/nodules Lungs: clear to auscultation  bilaterally Heart: regular rate and rhythm, S1, S2 normal, no murmur, click, rub or gallop Extremities: extremities normal, atraumatic, no cyanosis or edema Pulses: 2+ and symmetric Skin: Skin color, texture, turgor normal. No rashes or lesions Neurologic: Grossly normal  EKG sinus rhythm at 67 with inferior Q waves, lateral Q waves and nonspecific ST and T wave changes with occasional PVCs.  I personally reviewed this EKG.  ASSESSMENT AND PLAN:   Hypertension associated with diabetes ([place]) History of essential hypertension blood pressure measured today at 142/68.  He is on amlodipine metoprolol and ramipril.  Dyslipidemia History of dyslipidemia on statin therapy with lipid profile performed 11/08/2021 revealing total cholesterol 112, LDL 54 and HDL of 39.  History of non-ST elevation myocardial infarction (NSTEMI) History of non-STEMI with cardiac catheterization performed 7/17 in [place] that showed nonobstructive disease.  He denies chest pain or shortness of breath.     Stanley Harp MD FACP,FACC,FAHA, [facility] 12/24/2021 9:24 AM

## 2021-12-24 NOTE — Patient Instructions (Signed)
Medication Instructions:  Your physician recommends that you continue on your current medications as directed. Please refer to the Current Medication list given to you today.  *If you need a refill on your cardiac medications before your next appointment, please call your pharmacy*   Follow-Up: At [facility], you and your health needs are our priority.  As part of our continuing mission to provide you with exceptional heart care, we have created designated Provider Care Teams.  These Care Teams include your primary Cardiologist (physician) and Advanced Practice Providers (APPs -  Physician Assistants and Nurse Practitioners) who all work together to provide you with the care you need, when you need it.  We recommend signing up for the patient portal called "MyChart".  Sign up information is provided on this After Visit Summary.  MyChart is used to connect with patients for Virtual Visits (Telemedicine).  Patients are able to view lab/test results, encounter notes, upcoming appointments, etc.  Non-urgent messages can be sent to your provider as well.   To learn more about what you can do with MyChart, go to [email or web address].    Your next appointment:   12 month(s)  The format for your next appointment:   In Person  Provider:   Jonathan Berry, MD   

## 2021-12-24 NOTE — Assessment & Plan Note (Signed)
History of dyslipidemia on statin therapy with lipid profile performed 11/08/2021 revealing total cholesterol 112, LDL 54 and HDL of 39.

## 2021-12-24 NOTE — Assessment & Plan Note (Signed)
History of non-STEMI with cardiac catheterization performed 7/17 in [place] that showed nonobstructive disease.  He denies chest pain or shortness of breath.

## 2022-01-18 ENCOUNTER — Other Ambulatory Visit: Payer: Self-pay | Admitting: Physician Assistant

## 2022-01-18 DIAGNOSIS — E1169 Type 2 diabetes mellitus with other specified complication: Secondary | ICD-10-CM

## 2022-02-08 ENCOUNTER — Other Ambulatory Visit: Payer: Self-pay | Admitting: Cardiovascular Disease

## 2022-03-12 ENCOUNTER — Other Ambulatory Visit: Payer: Self-pay

## 2022-03-12 DIAGNOSIS — E1169 Type 2 diabetes mellitus with other specified complication: Secondary | ICD-10-CM

## 2022-03-12 MED ORDER — ATORVASTATIN CALCIUM 40 MG PO TABS: ORAL_TABLET | ORAL | 0 refills | Status: DC

## 2022-03-12 NOTE — Telephone Encounter (Signed)
30 day supply of Atorvastatin sent to pharmacy per protocol. Office visit required for further refills  LOV 11/08/21 NOV 05/19/22

## 2022-03-16 ENCOUNTER — Ambulatory Visit
Admission: EM | Admit: 2022-03-16 | Discharge: 2022-03-16 | Disposition: A | Discharge status: Home / Self Care | Payer: BC Managed Care – PPO | Attending: Internal Medicine | Admitting: Internal Medicine

## 2022-03-16 DIAGNOSIS — J069 Acute upper respiratory infection, unspecified: Secondary | ICD-10-CM | POA: Diagnosis present

## 2022-03-16 DIAGNOSIS — R059 Cough, unspecified: Secondary | ICD-10-CM | POA: Insufficient documentation

## 2022-03-16 DIAGNOSIS — Z79899 Other long term (current) drug therapy: Secondary | ICD-10-CM | POA: Diagnosis not present

## 2022-03-16 DIAGNOSIS — Z1152 Encounter for screening for COVID-19: Secondary | ICD-10-CM | POA: Insufficient documentation

## 2022-03-16 MED ORDER — BENZONATATE 100 MG PO CAPS: 100.0000 mg | ORAL_CAPSULE | Freq: Three times a day (TID) | ORAL | 0 refills | Status: DC | PRN

## 2022-03-16 MED ORDER — FLUTICASONE PROPIONATE 50 MCG/ACT NA SUSP: 1.0000 | Freq: Every day | NASAL | 0 refills | Status: DC

## 2022-03-16 NOTE — ED Provider Notes (Signed)
[facility]    CSN: [id number] Arrival date & time: 03/16/22  3790      History   Chief Complaint Chief Complaint  Patient presents with   Cough    HPI Stanley Cook is a 61 y.o. male.   Patient presents with cough, nasal congestion, sore throat that started about 6 days ago.  Patient reports that he has only taken cough drops for symptoms.  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  Denies any known fevers at home.  Denies chest pain, shortness of breath, ear pain, nausea, vomiting, diarrhea, abdominal pain.  Patient denies formal diagnosis of asthma or COPD.   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   Excessive cerumen in both ear canals 03/05/2020   Type 2 diabetes mellitus without complication, without long-term current use of insulin ([place]) 08/30/2018   History of non-ST elevation myocardial infarction (NSTEMI) 04/06/2018   CAD (coronary artery disease) 04/06/2018   PVC's (premature ventricular contractions) 24/12/7351   Systolic murmur 29/92/4268   Foot pain, right 08/11/2017   Acute right ankle pain 08/11/2017   Vitamin D deficiency 05/04/2017   Encounter for routine adult physical exam with abnormal findings 03/04/2017   Chronic kidney disease 12/04/2016   H/O hypertensive crisis 09/30/2016   Dyslipidemia 09/30/2016   Diabetes mellitus ([place]) 09/30/2016   Health care maintenance 08/19/2016   Hypertension associated with diabetes ([place]) 08/19/2016   Obesity, diabetes, and hypertension syndrome ([place]) 08/19/2016    History reviewed. No pertinent surgical history.     Home  Medications    Prior to Admission medications   Medication Sig Start Date End Date Taking? Authorizing Provider  amLODipine (NORVASC) 10 MG tablet TAKE 1 TABLET(10 MG) BY MOUTH DAILY 02/10/22   Lorretta Harp, MD  aspirin EC 81 MG tablet Take by mouth. 11/16/15   [provider]  atorvastatin (LIPITOR) 40 MG tablet TAKE 1 TABLET(40 MG) BY MOUTH AT BEDTIME 03/12/22   Ronnell Freshwater, NP  B-D ULTRAFINE III SHORT PEN 31G X 8 MM MISC USE DAILY 08/29/21   Abonza, Maritza, PA-C  benzonatate (TESSALON) 100 MG capsule Take 1 capsule (100 mg total) by mouth every 8 (eight) hours as needed for cough. 03/16/22   Teodora Medici, FNP  blood glucose meter kit and supplies KIT Dispense based on patient and insurance preference. Use up to four times daily as directed. (FOR ICD-9 250.00, 250.01). 09/30/16   Danford, Valetta Fuller D, NP  cholecalciferol (VITAMIN D3) 25 MCG (1000 UNIT) tablet Take 1,000 Units by mouth daily.    [provider]  dapagliflozin propanediol (FARXIGA) 10 MG TABS tablet Take 1 tablet (10 mg total) by mouth daily before breakfast. 10/24/21   Abonza, Maritza, PA-C  fluticasone (FLONASE) 50 MCG/ACT nasal spray Place 1 spray into both nostrils daily. 03/16/22   Teodora Medici, FNP  furosemide (LASIX) 40 MG tablet Take 40 mg by mouth. One tablet three times weekly as needed    [provider]  glucose blood test strip Use to check blood sugars twice daily 03/04/18   Danford,  Berna Spare, NP  metoprolol succinate (TOPROL-XL) 100 MG 24 hr tablet Take 100 mg by mouth daily. 11/11/19   [provider]  ramipril (ALTACE) 5 MG capsule Take 5 mg by mouth daily. 10/23/21   [provider]  sodium bicarbonate 650 MG tablet Take 650 mg by mouth daily. Patient not taking: Reported on 12/24/2021 11/11/19   [provider]  [place] 18 MG/3ML SOPN ADMINISTER 0.6 MG UNDER THE SKIN EVERY DAY 01/20/22   Lorrene Reid, PA-C    Family History Family History  Problem  Relation Age of Onset   Unexplained death Father 62       Natural causes, no autopsy   Diabetes Sister     Social History Social History   Tobacco Use   Smoking status: Never   Smokeless tobacco: Never  Substance Use Topics   Alcohol use: No   Drug use: No     Allergies   Penicillins   Review of Systems Review of Systems Per HPI  Physical Exam Triage Vital Signs ED Triage Vitals [03/16/22 0904]  Enc Vitals Group     BP 129/82     Pulse Rate 79     Resp 16     Temp 98 F (36.7 C)     Temp Source Oral     SpO2 97 %     Weight      Height      Head Circumference      Peak Flow      Pain Score 0     Pain Loc      Pain Edu?      Excl. in [place]?    No data found.  Updated Vital Signs BP 129/82 (BP Location: Left Arm)   Pulse 79   Temp 98 F (36.7 C) (Oral)   Resp 16   SpO2 97%   Visual Acuity Right Eye Distance:   Left Eye Distance:   Bilateral Distance:    Right Eye Near:   Left Eye Near:    Bilateral Near:     Physical Exam Constitutional:      General: He is not in acute distress.    Appearance: Normal appearance. He is not toxic-appearing or diaphoretic.  HENT:     Head: Normocephalic and atraumatic.     Right Ear: Tympanic membrane and ear canal normal.     Left Ear: Tympanic membrane and ear canal normal.     Nose: Congestion present.     Mouth/Throat:     Mouth: Mucous membranes are moist.     Pharynx: Posterior oropharyngeal erythema present. No pharyngeal swelling or oropharyngeal exudate.     Tonsils: No tonsillar exudate or tonsillar abscesses.  Eyes:     Extraocular Movements: Extraocular movements intact.     Conjunctiva/sclera: Conjunctivae normal.     Pupils: Pupils are equal, round, and reactive to light.  Cardiovascular:     Rate and Rhythm: Normal rate and regular rhythm.     Pulses: Normal pulses.     Heart sounds: Normal heart sounds.  Pulmonary:     Effort: Pulmonary effort is normal. No respiratory distress.      Breath sounds: Normal breath sounds. No stridor. No wheezing, rhonchi or rales.  Abdominal:     General: Abdomen is flat. Bowel sounds are normal.     Palpations: Abdomen is soft.  Musculoskeletal:        General: Normal range of motion.     Cervical back: Normal range  of motion.  Skin:    General: Skin is warm and dry.  Neurological:     General: No focal deficit present.     Mental Status: He is alert and oriented to person, place, and time. Mental status is at baseline.  Psychiatric:        Mood and Affect: Mood normal.        Behavior: Behavior normal.      UC Treatments / Results  Labs (all labs ordered are listed, but only abnormal results are displayed) Labs Reviewed  SARS CORONAVIRUS 2 (TAT 6-24 HRS)    EKG   Radiology No results found.  Procedures Procedures (including critical care time)  Medications Ordered in UC Medications - No data to display  Initial Impression / Assessment and Plan / UC Course  I have reviewed the triage vital signs and the nursing notes.  Pertinent labs & imaging results that were available during my care of the patient were reviewed by me and considered in my medical decision making (see chart for details).     Patient presents with symptoms likely from a viral upper respiratory infection. Differential includes bacterial pneumonia, sinusitis, allergic rhinitis, COVID-19, flu. Do not suspect underlying cardiopulmonary process. Symptoms seem unlikely related to ACS, CHF or COPD exacerbations, pneumonia, pneumothorax. Patient is nontoxic appearing and not in need of emergent medical intervention.  COVID test pending per patient request.  Recommended symptom control with over the counter medications that are safe for patient.  Patient sent prescriptions.  Return if symptoms fail to improve in 1-2 weeks or you develop shortness of breath, chest pain, severe headache. Patient states understanding and is agreeable.  Discharged with PCP  followup.  Final Clinical Impressions(s) / UC Diagnoses   Final diagnoses:  Viral upper respiratory tract infection with cough     Discharge Instructions      It appears that you have a viral upper respiratory infection which should run its course and self resolve with symptomatic treatment.  I have prescribed you a few medications to help alleviate symptoms.  Please follow-up if symptoms persist or worsen.  COVID test is pending.    ED Prescriptions     Medication Sig Dispense Auth. Provider   fluticasone (FLONASE) 50 MCG/ACT nasal spray  (Status: Discontinued) Place 1 spray into both nostrils daily. 16 g Vishaal Strollo, Hildred Alamin E, [place]   benzonatate (TESSALON) 100 MG capsule  (Status: Discontinued) Take 1 capsule (100 mg total) by mouth every 8 (eight) hours as needed for cough. 21 capsule [place], [place] E, [place]   benzonatate (TESSALON) 100 MG capsule Take 1 capsule (100 mg total) by mouth every 8 (eight) hours as needed for cough. 21 capsule [place], [place] E, [place]   fluticasone [facility]) 50 MCG/ACT nasal spray Place 1 spray into both nostrils daily. 16 g Teodora Medici, [place]      PDMP not reviewed this encounter.   Teodora Medici, [place] 03/16/22 [phone]

## 2022-03-16 NOTE — ED Triage Notes (Signed)
Pt c/o coughing spells, sore throat, headache, nasal congestion and drainage, fatigue, malaise   Onset ~ Tuesday   Denies pain in triage

## 2022-03-16 NOTE — Discharge Instructions (Signed)
It appears that you have a viral upper respiratory infection which should run its course and self resolve with symptomatic treatment.  I have prescribed you a few medications to help alleviate symptoms.  Please follow-up if symptoms persist or worsen.  COVID test is pending.

## 2022-03-17 LAB — SARS CORONAVIRUS 2 (TAT 6-24 HRS): SARS Coronavirus 2: NEGATIVE

## 2022-04-06 ENCOUNTER — Other Ambulatory Visit: Payer: Self-pay | Admitting: Nurse Practitioner

## 2022-04-06 DIAGNOSIS — E1169 Type 2 diabetes mellitus with other specified complication: Secondary | ICD-10-CM

## 2022-04-10 ENCOUNTER — Encounter: Payer: BC Managed Care – PPO | Admitting: Physician Assistant

## 2022-05-19 ENCOUNTER — Encounter: Payer: BC Managed Care – PPO | Admitting: Physician Assistant

## 2022-06-19 ENCOUNTER — Encounter: Payer: BC Managed Care – PPO | Admitting: Family Medicine

## 2022-07-02 LAB — LAB REPORT - SCANNED
Creatinine, POC: 141.6 mg/dL
EGFR: 29
Microalb Creat Ratio: 114

## 2022-07-03 ENCOUNTER — Other Ambulatory Visit: Payer: Self-pay

## 2022-07-03 DIAGNOSIS — E1169 Type 2 diabetes mellitus with other specified complication: Secondary | ICD-10-CM

## 2022-07-03 DIAGNOSIS — Z130 Encounter for screening for diseases of the blood and blood-forming organs and certain disorders involving the immune mechanism: Secondary | ICD-10-CM

## 2022-07-03 DIAGNOSIS — Z0000 Encounter for general adult medical examination without abnormal findings: Secondary | ICD-10-CM

## 2022-07-03 DIAGNOSIS — E1159 Type 2 diabetes mellitus with other circulatory complications: Secondary | ICD-10-CM

## 2022-07-09 ENCOUNTER — Other Ambulatory Visit: Payer: BC Managed Care – PPO

## 2022-07-09 DIAGNOSIS — Z0000 Encounter for general adult medical examination without abnormal findings: Secondary | ICD-10-CM

## 2022-07-09 DIAGNOSIS — E1159 Type 2 diabetes mellitus with other circulatory complications: Secondary | ICD-10-CM

## 2022-07-09 DIAGNOSIS — E1169 Type 2 diabetes mellitus with other specified complication: Secondary | ICD-10-CM

## 2022-07-10 LAB — COMPREHENSIVE METABOLIC PANEL
ALT: 16 IU/L (ref 0–44)
AST: 19 IU/L (ref 0–40)
Albumin/Globulin Ratio: 1.9 (ref 1.2–2.2)
Albumin: 4.2 g/dL (ref 3.8–4.9)
Alkaline Phosphatase: 104 IU/L (ref 44–121)
BUN/Creatinine Ratio: 11 (ref 9–20)
BUN: 24 mg/dL (ref 6–24)
Bilirubin Total: 0.5 mg/dL (ref 0.0–1.2)
CO2: 22 mmol/L (ref 20–29)
Calcium: 8.8 mg/dL (ref 8.7–10.2)
Chloride: 103 mmol/L (ref 96–106)
Creatinine, Ser: 2.18 mg/dL — ABNORMAL HIGH (ref 0.76–1.27)
Globulin, Total: 2.2 g/dL (ref 1.5–4.5)
Glucose: 113 mg/dL — ABNORMAL HIGH (ref 70–99)
Potassium: 4.4 mmol/L (ref 3.5–5.2)
Sodium: 141 mmol/L (ref 134–144)
Total Protein: 6.4 g/dL (ref 6.0–8.5)
eGFR: 34 mL/min/{1.73_m2} — ABNORMAL LOW (ref >59)

## 2022-07-10 LAB — CBC WITH DIFFERENTIAL/PLATELET
Basophils Absolute: 0 10*3/uL (ref 0.0–0.2)
Basos: 0 %
EOS (ABSOLUTE): 0.1 10*3/uL (ref 0.0–0.4)
Eos: 2 %
Hematocrit: 44.9 % (ref 37.5–51.0)
Hemoglobin: 14.6 g/dL (ref 13.0–17.7)
Immature Grans (Abs): 0 10*3/uL (ref 0.0–0.1)
Immature Granulocytes: 0 %
Lymphocytes Absolute: 1.3 10*3/uL (ref 0.7–3.1)
Lymphs: 19 %
MCH: 29.6 pg (ref 26.6–33.0)
MCHC: 32.5 g/dL (ref 31.5–35.7)
MCV: 91 fL (ref 79–97)
Monocytes Absolute: 0.5 10*3/uL (ref 0.1–0.9)
Monocytes: 8 %
Neutrophils Absolute: 5.1 10*3/uL (ref 1.4–7.0)
Neutrophils: 71 %
Platelets: 210 10*3/uL (ref 150–450)
RBC: 4.93 x10E6/uL (ref 4.14–5.80)
RDW: 13.6 % (ref 11.6–15.4)
WBC: 7.1 10*3/uL (ref 3.4–10.8)

## 2022-07-10 LAB — LIPID PANEL
Chol/HDL Ratio: 3.2 ratio (ref 0.0–5.0)
Cholesterol, Total: 114 mg/dL (ref 100–199)
HDL: 36 mg/dL — ABNORMAL LOW (ref >39)
LDL Chol Calc (NIH): 55 mg/dL (ref 0–99)
Triglycerides: 127 mg/dL (ref 0–149)
VLDL Cholesterol Cal: 23 mg/dL (ref 5–40)

## 2022-07-10 LAB — HEMOGLOBIN A1C
Est. average glucose Bld gHb Est-mCnc: 151 mg/dL
Hgb A1c MFr Bld: 6.9 % — ABNORMAL HIGH (ref 4.8–5.6)

## 2022-07-10 LAB — TSH: TSH: 4.3 u[IU]/mL (ref 0.450–4.500)

## 2022-07-14 ENCOUNTER — Other Ambulatory Visit: Payer: Self-pay | Admitting: Nurse Practitioner

## 2022-07-14 DIAGNOSIS — E1169 Type 2 diabetes mellitus with other specified complication: Secondary | ICD-10-CM

## 2022-07-16 ENCOUNTER — Encounter: Payer: Self-pay | Admitting: Family Medicine

## 2022-07-16 ENCOUNTER — Ambulatory Visit (INDEPENDENT_AMBULATORY_CARE_PROVIDER_SITE_OTHER): Payer: BC Managed Care – PPO | Admitting: Family Medicine

## 2022-07-16 VITALS — BP 124/73 | HR 68 | Resp 18 | Ht 69.0 in | Wt 246.0 lb

## 2022-07-16 DIAGNOSIS — E785 Hyperlipidemia, unspecified: Secondary | ICD-10-CM | POA: Diagnosis not present

## 2022-07-16 DIAGNOSIS — N1832 Chronic kidney disease, stage 3b: Secondary | ICD-10-CM | POA: Diagnosis not present

## 2022-07-16 DIAGNOSIS — E1159 Type 2 diabetes mellitus with other circulatory complications: Secondary | ICD-10-CM

## 2022-07-16 DIAGNOSIS — I152 Hypertension secondary to endocrine disorders: Secondary | ICD-10-CM

## 2022-07-16 DIAGNOSIS — E1169 Type 2 diabetes mellitus with other specified complication: Secondary | ICD-10-CM | POA: Diagnosis not present

## 2022-07-16 LAB — HM DIABETES EYE EXAM

## 2022-07-16 MED ORDER — DAPAGLIFLOZIN PROPANEDIOL 10 MG PO TABS
10.0000 mg | ORAL_TABLET | Freq: Every day | ORAL | 0 refills | Status: DC
Start: 2022-07-16 — End: 2023-01-19

## 2022-07-16 MED ORDER — VICTOZA 18 MG/3ML ~~LOC~~ SOPN
PEN_INJECTOR | SUBCUTANEOUS | 1 refills | Status: DC
Start: 2022-07-16 — End: 2023-02-23

## 2022-07-16 MED ORDER — ATORVASTATIN CALCIUM 40 MG PO TABS: 40.0000 mg | ORAL_TABLET | Freq: Every day | ORAL | 0 refills | Status: DC

## 2022-07-16 NOTE — Progress Notes (Signed)
Complete physical exam  Patient: Stanley Cook   DOB: January 30, 1961   62 y.o. Male  MRN: [id number]  Subjective:    Chief Complaint  Patient presents with   Annual Exam    Stanley Cook is a 61 y.o. male who presents today for a complete physical exam. He reports consuming a general diet, though he has been limiting his eating after 7 PM since he knows that that is often when he starts to snack. The patient does not participate in regular exercise at present. He generally feels well. He reports sleeping well. He does not have additional problems to discuss today.    Most recent fall risk assessment:    07/16/2022    4:08 PM  [place] in the past year? 0  Number falls in past yr: 0  Injury with Fall? 0  Risk for fall due to : No Fall Risks     Most recent depression screenings:    07/16/2022    4:08 PM 11/08/2021    9:11 AM  PHQ 2/9 Scores  PHQ - 2 Score 0 0  PHQ- 9 Score 0 0    Patient Active Problem List   Diagnosis Date Noted   Right carotid bruit 10/10/2020   Mixed conductive and sensorineural hearing loss of both ears 03/05/2020   Excessive cerumen in both ear canals 03/05/2020   History of non-ST elevation myocardial infarction (NSTEMI) 04/06/2018   CAD (coronary artery disease) 04/06/2018   PVC's (premature ventricular contractions) 123456   Systolic murmur 123456   Vitamin D deficiency 05/04/2017   Chronic kidney disease (CKD) stage G3b/A1, moderately decreased glomerular filtration rate (GFR) between 30-44 mL/min/1.73 square meter and albuminuria creatinine ratio less than 30 mg/g 12/04/2016   Diabetes mellitus 09/30/2016   Hyperlipidemia associated with type 2 diabetes mellitus 09/30/2016   Hypertension associated with diabetes 08/19/2016   Obesity, diabetes, and hypertension syndrome 08/19/2016    History reviewed. No pertinent surgical history. Social History   Tobacco Use   Smoking status: Never    Passive exposure: Never   Smokeless  tobacco: Never  Vaping Use   Vaping Use: Never used  Substance Use Topics   Alcohol use: No   Drug use: No   Family History  Problem Relation Age of Onset   Unexplained death Father 87       Natural causes, no autopsy   Diabetes Sister    Allergies  Allergen Reactions   Penicillins Other (See Comments)     Patient Care Team: Velva Harman, PA as PCP - General (Family Medicine) Estanislado Emms, MD as Consulting Physician (Nephrology)   Outpatient Medications Prior to Visit  Medication Sig   amLODipine (NORVASC) 10 MG tablet TAKE 1 TABLET(10 MG) BY MOUTH DAILY   aspirin EC 81 MG tablet Take by mouth.   B-D ULTRAFINE III SHORT PEN 31G X 8 MM MISC USE DAILY   benzonatate (TESSALON) 100 MG capsule Take 1 capsule (100 mg total) by mouth every 8 (eight) hours as needed for cough.   cholecalciferol (VITAMIN D3) 25 MCG (1000 UNIT) tablet Take 1,000 Units by mouth daily.   fluticasone (FLONASE) 50 MCG/ACT nasal spray Place 1 spray into both nostrils daily.   furosemide (LASIX) 40 MG tablet Take 40 mg by mouth. One tablet three times weekly as needed   glucose blood test strip Use to check blood sugars twice daily   metoprolol succinate (TOPROL-XL) 100 MG 24 hr tablet Take  100 mg by mouth daily.   ramipril (ALTACE) 5 MG capsule Take 5 mg by mouth daily.   sodium bicarbonate 650 MG tablet Take 650 mg by mouth daily.   [DISCONTINUED] atorvastatin (LIPITOR) 40 MG tablet TAKE 1 TABLET(40 MG) BY MOUTH AT BEDTIME   [DISCONTINUED] blood glucose meter kit and supplies KIT Dispense based on patient and insurance preference. Use up to four times daily as directed. (FOR ICD-9 250.00, 250.01).   [DISCONTINUED] dapagliflozin propanediol (FARXIGA) 10 MG TABS tablet Take 1 tablet (10 mg total) by mouth daily before breakfast.   [DISCONTINUED] VICTOZA 18 MG/3ML SOPN ADMINISTER 0.6 MG UNDER THE SKIN EVERY DAY   No facility-administered medications prior to visit.    Review of Systems   Constitutional:  Negative for chills, fever and malaise/fatigue.  HENT:  Negative for congestion and hearing loss.   Eyes:  Negative for blurred vision and double vision.  Respiratory:  Negative for cough and shortness of breath.   Cardiovascular:  Negative for chest pain, palpitations and leg swelling.  Gastrointestinal:  Negative for abdominal pain, constipation, diarrhea and heartburn.  Genitourinary:  Negative for frequency and urgency.  Musculoskeletal:  Negative for myalgias and neck pain.  Neurological:  Negative for headaches.  Endo/Heme/Allergies:  Negative for polydipsia.  Psychiatric/Behavioral:  Negative for depression. The patient is not nervous/anxious.        Objective:    BP 124/73 (BP Location: Left Arm, Patient Position: Sitting, Cuff Size: Normal)   Pulse 68   Resp 18   Ht 5\' 9"  (1.753 m)   Wt 246 lb (111.6 kg)   SpO2 98%   BMI 36.33 kg/m    Physical Exam Constitutional:      General: He is not in acute distress.    Appearance: Normal appearance.  HENT:     Head: Normocephalic and atraumatic.     Right Ear: Tympanic membrane and ear canal normal.     Left Ear: Tympanic membrane and ear canal normal.     Ears:     Comments: Bilateral hearing aids    Nose: Nose normal.     Mouth/Throat:     Mouth: Mucous membranes are moist.     Pharynx: Oropharynx is clear.  Eyes:     Pupils: Pupils are equal, round, and reactive to light.  Cardiovascular:     Rate and Rhythm: Normal rate and regular rhythm.     Pulses: Normal pulses.     Heart sounds: Normal heart sounds.  Pulmonary:     Effort: Pulmonary effort is normal.     Breath sounds: Normal breath sounds.  Abdominal:     General: Bowel sounds are normal.     Palpations: Abdomen is soft.  Musculoskeletal:        General: Normal range of motion.     Cervical back: Normal range of motion and neck supple.  Skin:    General: Skin is warm and dry.  Neurological:     Mental Status: He is alert and  oriented to person, place, and time.  Psychiatric:        Mood and Affect: Mood normal.       Assessment & Plan:    Routine Health Maintenance and Physical Exam  Immunization History  Administered Date(s) Administered   Influenza,inj,Quad PF,6+ Mos 12/25/2017, 11/27/2018, 01/09/2021   Influenza-Unspecified 12/13/2016, 12/25/2017, 01/05/2020   PFIZER(Purple Top)SARS-COV-2 Vaccination 06/11/2019, 07/02/2019   Pneumococcal Conjugate-13 11/16/2015   Tdap 12/17/2015   Zoster Recombinat (Shingrix) 04/12/2021, 07/09/2021  Health Maintenance  Topic Date Due   Hepatitis C Screening  Never done   COVID-19 Vaccine (3 - Pfizer risk series) 07/30/2019   OPHTHALMOLOGY EXAM  02/01/2022   FOOT EXAM  04/12/2022   HIV Screening  09/15/2022 (Originally 02/17/1976)   Diabetic kidney evaluation - Urine ACR  11/09/2022   INFLUENZA VACCINE  11/13/2022   HEMOGLOBIN A1C  01/09/2023   Diabetic kidney evaluation - eGFR measurement  07/09/2023   DTaP/Tdap/Td (2 - Td or Tdap) 12/16/2025   COLONOSCOPY (Pts 45-50yrs Insurance coverage will need to be confirmed)  05/06/2031   Zoster Vaccines- Shingrix  Completed   HPV VACCINES  Aged Out    Discussed health benefits of physical activity, and encouraged him to engage in regular exercise appropriate for his age and condition.  Hyperlipidemia associated with type 2 diabetes mellitus Assessment & Plan: Last lipid panel: LDL 55, HDL 36, triglycerides 127.  Meets goal LDL less than 70.  Continue atorvastatin 40 mg daily.  Will continue to monitor.  Orders: -     Atorvastatin Calcium; Take 1 tablet (40 mg total) by mouth daily.  Dispense: 90 tablet; Refill: 0  Hypertension associated with diabetes Assessment & Plan: Followed by cardiology.  Stable.  Continue amlodipine 10 mg daily, furosemide 40 mg daily, metoprolol 100 mg daily, ramipril 5 mg daily.   Chronic kidney disease (CKD) stage G3b/A1, moderately decreased glomerular filtration rate (GFR) between  30-44 mL/min/1.73 square meter and albuminuria creatinine ratio less than 30 mg/g Assessment & Plan: Followed by Dr. Royce Macadamia at [facility].  eGFR 34, up slightly from last check of 31.   Type 2 diabetes mellitus with other specified complication, without long-term current use of insulin Assessment & Plan: A1c 6.9, increased slightly from last check of 6.8.  Encouraged him to continue limiting eating after 7 PM, also encouraged him to limit sugary foods and carbohydrates by being mindful of nutrition labels.  We discussed the importance of making these dietary changes to prevent needing to add additional medications or increase his current medications.  Patient verbalized understanding and is agreeable to this plan.  Orders: -     Victoza; ADMINISTER 0.6 MG UNDER THE SKIN EVERY DAY  Dispense: 9 mL; Refill: 1 -     Dapagliflozin Propanediol; Take 1 tablet (10 mg total) by mouth daily before breakfast.  Dispense: 30 tablet; Refill: 0    Return in about 4 months (around 11/15/2022) for follow-up, fasting blood work 1 week before.     Velva Harman, PA

## 2022-07-16 NOTE — Assessment & Plan Note (Signed)
A1c 6.9, increased slightly from last check of 6.8.  Encouraged him to continue limiting eating after 7 PM, also encouraged him to limit sugary foods and carbohydrates by being mindful of nutrition labels.  We discussed the importance of making these dietary changes to prevent needing to add additional medications or increase his current medications.  Patient verbalized understanding and is agreeable to this plan.

## 2022-07-16 NOTE — Assessment & Plan Note (Signed)
Followed by cardiology.  Stable.  Continue amlodipine 10 mg daily, furosemide 40 mg daily, metoprolol 100 mg daily, ramipril 5 mg daily.

## 2022-07-16 NOTE — Assessment & Plan Note (Signed)
Followed by Dr. Royce Macadamia at [facility].  eGFR 34, up slightly from last check of 31.

## 2022-07-16 NOTE — Assessment & Plan Note (Signed)
Last lipid panel: LDL 55, HDL 36, triglycerides 127.  Meets goal LDL less than 70.  Continue atorvastatin 40 mg daily.  Will continue to monitor.

## 2022-07-17 ENCOUNTER — Encounter: Payer: Self-pay | Admitting: Family Medicine

## 2022-10-10 ENCOUNTER — Other Ambulatory Visit: Payer: Self-pay | Admitting: Family Medicine

## 2022-10-10 DIAGNOSIS — E1169 Type 2 diabetes mellitus with other specified complication: Secondary | ICD-10-CM

## 2022-10-27 ENCOUNTER — Telehealth: Payer: Self-pay | Admitting: *Deleted

## 2022-10-27 DIAGNOSIS — E119 Type 2 diabetes mellitus without complications: Secondary | ICD-10-CM

## 2022-10-27 MED ORDER — BD PEN NEEDLE SHORT U/F 31G X 8 MM MISC
11 refills | Status: AC
Start: 2022-10-27 — End: ?

## 2022-10-27 NOTE — Telephone Encounter (Signed)
Prescription Request  10/27/2022  LOV: 07/16/22 ROV: 11/18/22     What is the name of the medication or equipment? B-D ULTRAFINE III SHORT PEN 31G X 8 MM MISC   Have you contacted your pharmacy to request a refill? Yes   Which pharmacy would you like this sent to?  [id number] - [place], [place] - [address] AT [address] [place] [address] Phone: [phone] Fax: [phone]   Patient notified that their request is being sent to the clinical staff for review and that they should receive a response within 2 business days.   Please let patient know when complete, he is heading out of town tomorrow.

## 2022-10-27 NOTE — Telephone Encounter (Signed)
 Refill sent.

## 2022-11-12 ENCOUNTER — Other Ambulatory Visit: Payer: Self-pay

## 2022-11-12 DIAGNOSIS — Z0000 Encounter for general adult medical examination without abnormal findings: Secondary | ICD-10-CM

## 2022-11-12 DIAGNOSIS — E1159 Type 2 diabetes mellitus with other circulatory complications: Secondary | ICD-10-CM

## 2022-11-12 DIAGNOSIS — E1169 Type 2 diabetes mellitus with other specified complication: Secondary | ICD-10-CM

## 2022-11-12 DIAGNOSIS — E119 Type 2 diabetes mellitus without complications: Secondary | ICD-10-CM

## 2022-11-13 ENCOUNTER — Other Ambulatory Visit: Payer: BC Managed Care – PPO

## 2022-11-13 DIAGNOSIS — E1159 Type 2 diabetes mellitus with other circulatory complications: Secondary | ICD-10-CM

## 2022-11-13 DIAGNOSIS — E785 Hyperlipidemia, unspecified: Secondary | ICD-10-CM

## 2022-11-13 DIAGNOSIS — Z0000 Encounter for general adult medical examination without abnormal findings: Secondary | ICD-10-CM

## 2022-11-13 DIAGNOSIS — E119 Type 2 diabetes mellitus without complications: Secondary | ICD-10-CM

## 2022-11-14 ENCOUNTER — Ambulatory Visit: Payer: BC Managed Care – PPO | Attending: Cardiovascular Disease | Admitting: Cardiovascular Disease

## 2022-11-14 ENCOUNTER — Encounter: Payer: Self-pay | Admitting: Cardiovascular Disease

## 2022-11-14 VITALS — BP 122/70 | HR 63 | Ht 69.0 in | Wt 248.2 lb

## 2022-11-14 DIAGNOSIS — E1169 Type 2 diabetes mellitus with other specified complication: Secondary | ICD-10-CM

## 2022-11-14 DIAGNOSIS — E1159 Type 2 diabetes mellitus with other circulatory complications: Secondary | ICD-10-CM | POA: Diagnosis not present

## 2022-11-14 DIAGNOSIS — I252 Old myocardial infarction: Secondary | ICD-10-CM | POA: Diagnosis not present

## 2022-11-14 DIAGNOSIS — Z7985 Long-term (current) use of injectable non-insulin antidiabetic drugs: Secondary | ICD-10-CM

## 2022-11-14 DIAGNOSIS — I152 Hypertension secondary to endocrine disorders: Secondary | ICD-10-CM | POA: Diagnosis not present

## 2022-11-14 DIAGNOSIS — E785 Hyperlipidemia, unspecified: Secondary | ICD-10-CM

## 2022-11-14 NOTE — Assessment & Plan Note (Signed)
History of essential hypertension blood pressure measured at 122/70.  He is on amlodipine, metoprolol and ramipril.

## 2022-11-14 NOTE — Assessment & Plan Note (Signed)
History of non-STEMI July 2017 in [place] with card catheterization that showed nonobstructive disease.  He denies chest pain or shortness of breath.

## 2022-11-14 NOTE — Patient Instructions (Signed)

## 2022-11-14 NOTE — Assessment & Plan Note (Signed)
History of hyperlipidemia on statin therapy with lipid profile performed 07/09/2022 revealing total cholesterol 114, LDL 55 and HDL 36.

## 2022-11-14 NOTE — Progress Notes (Signed)
11/14/2022 Stanley Cook   [birth year]  [id number]  Primary Physician Melida Quitter, PA Primary Cardiologist: Runell Gess MD Nicholes Calamity, [place]  HPI:  Stanley Cook is a 62 y.o.  single male with no children who works as as 6 Facilities manager at [employer]..  His primary care provider was Dr. Mayer Masker. I last saw him in the office 12/24/2021. He has a history of treated hypertension, and hyperlipidemia.  He also has diabetes and chronic kidney disease with a serum creatinine in the 2 range.  He does not smoke.  He had a non-STEMI in [place] and had cath that revealed noncritical CAD.  He did have a 2D echo performed 04/15/2018 that revealed normal LV function.     Since I saw him a year ago he continues to do well.  He is completely asymptomatic.  He did have recent blood work performed by his PCP on 07/09/2022 revealed triglyceride 114, LDL 55 HDL 36.  On 420, he lost his mother last year at the age of 21.   Current Meds  Medication Sig   amLODipine (NORVASC) 10 MG tablet TAKE 1 TABLET(10 MG) BY MOUTH DAILY   aspirin EC 81 MG tablet Take by mouth.   atorvastatin (LIPITOR) 40 MG tablet TAKE 1 TABLET(40 MG) BY MOUTH DAILY   cholecalciferol (VITAMIN D3) 25 MCG (1000 UNIT) tablet Take 1,000 Units by mouth daily.   dapagliflozin propanediol (FARXIGA) 10 MG TABS tablet Take 1 tablet (10 mg total) by mouth daily before breakfast.   furosemide (LASIX) 40 MG tablet Take 40 mg by mouth. One tablet three times weekly as needed   glucose blood test strip Use to check blood sugars twice daily   Insulin Pen Needle (B-D ULTRAFINE III SHORT PEN) 31G X 8 MM MISC USE DAILY   liraglutide (VICTOZA) 18 MG/3ML SOPN ADMINISTER 0.6 MG UNDER THE SKIN EVERY DAY   metoprolol succinate (TOPROL-XL) 100 MG 24 hr tablet Take 100 mg by mouth daily.   ramipril (ALTACE) 5 MG capsule Take 5 mg by mouth daily.   sodium bicarbonate 650 MG tablet Take 650 mg by mouth daily.      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   Social Determinants of Health   Financial Resource Strain: Not on file  Food Insecurity: Not on file  Transportation Needs: Not on file  Physical Activity: Not on file  Stress: Not on file  Social Connections: Not on file  Intimate Partner Violence: Not on file     Review of Systems: General: negative for chills, fever, night sweats or weight changes.  Cardiovascular: negative for chest pain, dyspnea  on exertion, edema, orthopnea, palpitations, paroxysmal nocturnal dyspnea or shortness of breath Dermatological: negative for rash Respiratory: negative for cough or wheezing Urologic: negative for hematuria Abdominal: negative for nausea, vomiting, diarrhea, bright red blood per rectum, melena, or hematemesis Neurologic: negative for visual changes, syncope, or dizziness All other systems reviewed and are otherwise negative except as noted above.    Blood pressure 122/70, pulse 63, height 5\' 9"  (1.753 m), weight 248 lb 3.2 oz (112.6 kg), SpO2 98%.  General appearance: alert and no distress Neck: no adenopathy, no carotid bruit, no JVD, supple, symmetrical, trachea midline, and thyroid not enlarged, symmetric, no tenderness/mass/nodules Lungs: clear to auscultation bilaterally Heart:  Regular rate and rhythm without murmurs gallops rubs or clicks Extremities: extremities normal, atraumatic, no cyanosis or edema Pulses: 2+ and symmetric Skin: Skin color, texture, turgor normal. No rashes or lesions Neurologic: Grossly normal  EKG EKG Interpretation Date/Time:  Friday November 14 2022 09:58:17 EDT Ventricular Rate:  63 PR Interval:  186 QRS Duration:  116 QT Interval:  464 QTC Calculation: 474 R Axis:   -17  Text Interpretation: Sinus rhythm with occasional Premature ventricular complexes Lateral infarct , age undetermined ST & T wave abnormality, consider anterior ischemia No previous ECGs available Confirmed by Nanetta Batty [phone]) on 11/14/2022 10:16:01 AM    ASSESSMENT AND PLAN:   Hypertension associated with diabetes (HCC) History of essential hypertension blood pressure measured at 122/70.  He is on amlodipine, metoprolol and ramipril.  History of non-ST elevation myocardial infarction (NSTEMI) History of non-STEMI July 2017 in [place] with card catheterization that showed nonobstructive disease.  He denies chest pain or shortness of breath.  Hyperlipidemia associated with type 2 diabetes mellitus (HCC) History of hyperlipidemia on statin therapy with lipid profile performed 07/09/2022 revealing total cholesterol 114, LDL 55 and HDL 36.     Runell Gess MD [facility], [facility] 11/14/2022 10:23 AM

## 2022-11-18 ENCOUNTER — Encounter: Payer: Self-pay | Admitting: Family Medicine

## 2022-11-18 ENCOUNTER — Ambulatory Visit: Payer: BC Managed Care – PPO | Admitting: Family Medicine

## 2022-11-18 VITALS — BP 133/82 | HR 63 | Resp 18 | Ht 69.0 in | Wt 246.0 lb

## 2022-11-18 DIAGNOSIS — Z7985 Long-term (current) use of injectable non-insulin antidiabetic drugs: Secondary | ICD-10-CM | POA: Diagnosis not present

## 2022-11-18 DIAGNOSIS — N1832 Chronic kidney disease, stage 3b: Secondary | ICD-10-CM

## 2022-11-18 DIAGNOSIS — E1122 Type 2 diabetes mellitus with diabetic chronic kidney disease: Secondary | ICD-10-CM | POA: Diagnosis not present

## 2022-11-18 NOTE — Progress Notes (Unsigned)
   Established Patient Office Visit  Subjective   Patient ID: Stanley Cook, male    DOB: [birth year]  Age: 62 y.o. MRN: [id number]  Chief Complaint  Patient presents with   Diabetes    HPI Patient has recently seen his cardiologist and nephrologist.  No changes to the medications they prescribed were made.  Patient has no concerns with his diabetes medications.  Checks his blood sugar about once a week.  Goes to vision works on [address] and recently had diabetic retinopathy screen performed.  States it was negative.  He has not noticed any ulcerations or lesions on his feet.  No neuropathy.   The ASCVD Risk score (Arnett DK, et al., 2019) failed to calculate for the following reasons:   The patient has a prior MI or stroke diagnosis  Health Maintenance Due  Topic Date Due   HIV Screening  Never done   Hepatitis C Screening  Never done   COVID-19 Vaccine (3 - 2023-24 season) 12/13/2021   FOOT EXAM  04/12/2022   INFLUENZA VACCINE  11/13/2022      Objective:     BP 133/82 (BP Location: Left Arm, Patient Position: Sitting, Cuff Size: Large)   Pulse 63   Resp 18   Ht 5\' 9"  (1.753 m)   Wt 246 lb (111.6 kg)   SpO2 98%   BMI 36.33 kg/m  {Vitals History (Optional):23777}  Physical Exam General: Alert and oriented Pulmonary: No respiratory distress Extremities: Mild lower extremity edema.  Thickened yellow toenails.  Otherwise normal foot exam.   No results found for any visits on 11/18/22.      Assessment & Plan:   Type 2 diabetes mellitus with stage 3b chronic kidney disease, without long-term current use of insulin (HCC) Assessment & Plan: On Farxiga and regular diet.  Well-controlled, A1c less than 7 again.  Not on metformin due to kidney disease. - Follow-up in 6 months - Diabetic foot exam performed today.  Discussed self exams at home with patient.      Return in about 6 months (around 05/21/2023) for DM.    Sandre Kitty, MD

## 2022-11-18 NOTE — Patient Instructions (Addendum)
It was nice to see you today,  We addressed the following topics today: - your A1c is well controlled. You can see [place] again in 6 months.  Continue your current medications - if you would like your liraglutide sent somewhere else let us know.   - if your rash worsens let us know and we can re-evaluate it.    Have a great day,  Frederic Jericho, MD

## 2022-11-18 NOTE — Assessment & Plan Note (Signed)
On Farxiga and regular diet.  Well-controlled, A1c less than 7 again.  Not on metformin due to kidney disease. - Follow-up in 6 months - Diabetic foot exam performed today.  Discussed self exams at home with patient.

## 2023-01-08 ENCOUNTER — Other Ambulatory Visit: Payer: Self-pay | Admitting: Family Medicine

## 2023-01-08 DIAGNOSIS — E1169 Type 2 diabetes mellitus with other specified complication: Secondary | ICD-10-CM

## 2023-01-17 ENCOUNTER — Other Ambulatory Visit: Payer: Self-pay | Admitting: Family Medicine

## 2023-01-17 DIAGNOSIS — E1169 Type 2 diabetes mellitus with other specified complication: Secondary | ICD-10-CM

## 2023-02-03 ENCOUNTER — Other Ambulatory Visit: Payer: Self-pay | Admitting: Cardiovascular Disease

## 2023-02-21 ENCOUNTER — Other Ambulatory Visit: Payer: Self-pay | Admitting: Family Medicine

## 2023-02-21 DIAGNOSIS — E1169 Type 2 diabetes mellitus with other specified complication: Secondary | ICD-10-CM

## 2023-04-05 ENCOUNTER — Other Ambulatory Visit: Payer: Self-pay | Admitting: Family Medicine

## 2023-04-05 DIAGNOSIS — E1169 Type 2 diabetes mellitus with other specified complication: Secondary | ICD-10-CM

## 2023-04-13 ENCOUNTER — Other Ambulatory Visit: Payer: Self-pay | Admitting: Family Medicine

## 2023-04-13 DIAGNOSIS — E1169 Type 2 diabetes mellitus with other specified complication: Secondary | ICD-10-CM

## 2023-05-07 ENCOUNTER — Other Ambulatory Visit: Payer: Self-pay | Admitting: Family Medicine

## 2023-05-07 MED ORDER — METOPROLOL SUCCINATE ER 100 MG PO TB24: 100.0000 mg | ORAL_TABLET | Freq: Every day | ORAL | 0 refills | Status: DC

## 2023-05-21 ENCOUNTER — Ambulatory Visit: Payer: 59 | Admitting: Family Medicine

## 2023-05-21 ENCOUNTER — Encounter: Payer: Self-pay | Admitting: Family Medicine

## 2023-05-21 VITALS — BP 132/78 | HR 62 | Ht 69.0 in | Wt 250.0 lb

## 2023-05-21 DIAGNOSIS — Z7985 Long-term (current) use of injectable non-insulin antidiabetic drugs: Secondary | ICD-10-CM

## 2023-05-21 DIAGNOSIS — E1169 Type 2 diabetes mellitus with other specified complication: Secondary | ICD-10-CM | POA: Diagnosis not present

## 2023-05-21 DIAGNOSIS — E1159 Type 2 diabetes mellitus with other circulatory complications: Secondary | ICD-10-CM

## 2023-05-21 DIAGNOSIS — N1832 Chronic kidney disease, stage 3b: Secondary | ICD-10-CM | POA: Diagnosis not present

## 2023-05-21 DIAGNOSIS — E785 Hyperlipidemia, unspecified: Secondary | ICD-10-CM

## 2023-05-21 DIAGNOSIS — E1122 Type 2 diabetes mellitus with diabetic chronic kidney disease: Secondary | ICD-10-CM | POA: Diagnosis not present

## 2023-05-21 DIAGNOSIS — I152 Hypertension secondary to endocrine disorders: Secondary | ICD-10-CM | POA: Diagnosis not present

## 2023-05-21 LAB — POCT GLYCOSYLATED HEMOGLOBIN (HGB A1C)
HbA1c POC (<> result, manual entry): 6.8 % (ref 4.0–5.6)
HbA1c, POC (controlled diabetic range): 6.8 % (ref 0.0–7.0)
Hemoglobin A1C: 6.8 % — AB (ref 4.0–5.6)

## 2023-05-21 MED ORDER — ATORVASTATIN CALCIUM 40 MG PO TABS: 40.0000 mg | ORAL_TABLET | Freq: Every day | ORAL | 1 refills | Status: DC

## 2023-05-21 MED ORDER — DAPAGLIFLOZIN PROPANEDIOL 10 MG PO TABS: 10.0000 mg | ORAL_TABLET | Freq: Every day | ORAL | 1 refills | Status: DC

## 2023-05-21 MED ORDER — LIRAGLUTIDE 18 MG/3ML ~~LOC~~ SOPN: 0.6000 mg | PEN_INJECTOR | Freq: Every day | SUBCUTANEOUS | 3 refills | Status: DC

## 2023-05-21 NOTE — Assessment & Plan Note (Signed)
 Last lipid panel: LDL 58, HDL 38, triglycerides 104.  Continue atorvastatin  40 mg daily.  Recheck lipid panel in 6 months.  Will continue to monitor.

## 2023-05-21 NOTE — Progress Notes (Signed)
 Established Patient Office Visit  Subjective   Patient ID: Stanley Cook, male    DOB: 1960/04/26  Age: 63 y.o. MRN: [id number]  Chief Complaint  Patient presents with   Diabetes    HPI Stanley Cook is a 63 y.o. male presenting today for follow up of diabetes. Denies hypoglycemic events, wounds or sores that are not healing well, increased thirst or urination. Denies vision problems, eye exam up to date. Taking Farxiga  and Victoza  as prescribed without any side effects.   Outpatient Medications Prior to Visit  Medication Sig   amLODipine  (NORVASC ) 10 MG tablet TAKE 1 TABLET(10 MG) BY MOUTH DAILY   aspirin EC 81 MG tablet Take by mouth.   cholecalciferol (VITAMIN D3) 25 MCG (1000 UNIT) tablet Take 1,000 Units by mouth daily.   furosemide  (LASIX ) 40 MG tablet Take 40 mg by mouth. One tablet three times weekly as needed   glucose blood test strip Use to check blood sugars twice daily   Insulin Pen Needle (B-D ULTRAFINE III SHORT PEN) 31G X 8 MM MISC USE DAILY   metoprolol  succinate (TOPROL -XL) 100 MG 24 hr tablet Take 1 tablet (100 mg total) by mouth daily.   ramipril  (ALTACE ) 5 MG capsule Take 5 mg by mouth daily.   sodium bicarbonate 650 MG tablet Take 650 mg by mouth daily.   [DISCONTINUED] atorvastatin  (LIPITOR) 40 MG tablet TAKE 1 TABLET(40 MG) BY MOUTH DAILY   [DISCONTINUED] dapagliflozin  propanediol (FARXIGA ) 10 MG TABS tablet TAKE 1 TABLET(10 MG) BY MOUTH DAILY BEFORE BREAKFAST   [DISCONTINUED] VICTOZA  18 MG/3ML SOPN ADMINISTER 0.6 MG UNDER THE SKIN EVERY DAY   No facility-administered medications prior to visit.    ROS Negative unless otherwise noted in HPI   Objective:     BP 132/78   Pulse 62   Ht 5' 9 (1.753 m)   Wt 250 lb (113.4 kg)   SpO2 99%   BMI 36.92 kg/m   Physical Exam Constitutional:      General: He is not in acute distress.    Appearance: Normal appearance.  HENT:     Head: Normocephalic and atraumatic.  Cardiovascular:     Rate and Rhythm:  Normal rate and regular rhythm.     Heart sounds: Normal heart sounds. No murmur heard.    No friction rub. No gallop.  Pulmonary:     Effort: Pulmonary effort is normal. No respiratory distress.     Breath sounds: Normal breath sounds. No wheezing, rhonchi or rales.  Skin:    General: Skin is warm and dry.  Neurological:     Mental Status: He is alert and oriented to person, place, and time.  Psychiatric:        Mood and Affect: Mood normal.    Results for orders placed or performed in visit on 05/21/23  POCT glycosylated hemoglobin (Hb A1C)  Result Value Ref Range   Hemoglobin A1C 6.8 (A) 4.0 - 5.6 %   HbA1c POC (<> result, manual entry) 6.8 4.0 - 5.6 %   HbA1c, POC (prediabetic range)     HbA1c, POC (controlled diabetic range) 6.8 0.0 - 7.0 %     Assessment & Plan:  Type 2 diabetes mellitus with stage 3b chronic kidney disease, without long-term current use of insulin (HCC) Assessment & Plan: A1c today 6.8, at goal <7.0.  Continue Farxiga  10 mg daily and Victoza  0.6 mg daily.  Eye exam and foot exam up-to-date.  Will continue to monitor.  Orders: -  POCT glycosylated hemoglobin (Hb A1C)  Hypertension associated with diabetes (HCC) Assessment & Plan: BP goal <130/80.  Stable.  Continue amlodipine  10 mg daily, metoprolol  succinate 100 mg daily, ramipril  5 mg daily.  Repeat CMP in 6 months.  Will continue to monitor.   Hyperlipidemia associated with type 2 diabetes mellitus (HCC) Assessment & Plan: Last lipid panel: LDL 58, HDL 38, triglycerides 104.  Continue atorvastatin  40 mg daily.  Recheck lipid panel in 6 months.  Will continue to monitor.  Orders: -     Atorvastatin  Calcium ; Take 1 tablet (40 mg total) by mouth daily.  Dispense: 90 tablet; Refill: 1  Type 2 diabetes mellitus with other specified complication, without long-term current use of insulin (HCC) Assessment & Plan: A1c today 6.8, at goal <7.0.  Continue Farxiga  10 mg daily and Victoza  0.6 mg daily.  Eye  exam and foot exam up-to-date.  Will continue to monitor.  Orders: -     Dapagliflozin  Propanediol; Take 1 tablet (10 mg total) by mouth daily.  Dispense: 90 tablet; Refill: 1 -     Liraglutide ; Inject 0.6 mg into the skin daily.  Dispense: 9 mL; Refill: 3    Return in about 6 months (around 11/18/2023) for annual physical, fasting labs 1 week before.    Joesph DELENA Sear, PA

## 2023-05-21 NOTE — Assessment & Plan Note (Addendum)
 A1c today 6.8, at goal <7.0.  Continue Farxiga  10 mg daily and Victoza  0.6 mg daily.  Eye exam and foot exam up-to-date.  Will continue to monitor.

## 2023-05-21 NOTE — Assessment & Plan Note (Signed)
 BP goal <130/80.  Stable.  Continue amlodipine  10 mg daily, metoprolol  succinate 100 mg daily, ramipril  5 mg daily.  Repeat CMP in 6 months.  Will continue to monitor.

## 2023-06-01 ENCOUNTER — Other Ambulatory Visit: Payer: Self-pay | Admitting: Family Medicine

## 2023-06-03 LAB — LAB REPORT - SCANNED
Creatinine, POC: 124.3 mg/dL
EGFR: 32

## 2023-06-05 ENCOUNTER — Other Ambulatory Visit: Payer: Self-pay | Admitting: Family Medicine

## 2023-06-05 DIAGNOSIS — E1169 Type 2 diabetes mellitus with other specified complication: Secondary | ICD-10-CM

## 2023-06-05 NOTE — Telephone Encounter (Signed)
Copied from CRM [id number]. Topic: Clinical - Medication Refill >> Jun 05, 2023  3:29 PM Phill Myron wrote: Most Recent Primary Care Visit:  Provider: Saralyn Pilar A  Department: [facility]  Visit Type: OFFICE VISIT 20  Date: 05/21/2023  Medication: liraglutide (VICTOZA) 18 MG/3ML SOPN  Has the patient contacted their pharmacy? Yes (Agent: If no, request that the patient contact the pharmacy for the refill. If patient does not wish to contact the pharmacy document the reason why and proceed with request.) (Agent: If yes, when and what did the pharmacy advise?)  Is this the correct pharmacy for this prescription? Yes If no, delete pharmacy and type the correct one.  This is the patient's preferred pharmacy:  [facility] [address], [place] - [address] AT [address] [place] [address] Phone: [phone] Fax: [phone]       Is the patient out of the medication? Yes  Has the patient been seen for an appointment in the last year OR does the patient have an upcoming appointment? Yes  Can we respond through MyChart? Yes  Agent: Please be advised that Rx refills may take up to 3 business days. We ask that you follow-up with your pharmacy.

## 2023-09-01 ENCOUNTER — Other Ambulatory Visit: Payer: Self-pay | Admitting: Family Medicine

## 2023-09-03 ENCOUNTER — Encounter: Payer: Self-pay | Admitting: Cardiovascular Disease

## 2023-09-28 ENCOUNTER — Telehealth: Payer: Self-pay | Admitting: *Deleted

## 2023-09-28 NOTE — Telephone Encounter (Signed)
 Lvm for pt to call office to reschedule his lab appointment and the office visit he originally had with Britta Candy, she is not longer her. Please assist him in getting these rescheduled with a lab appointment and a office visit with the new provider Meryl Acosta if he calls back.

## 2023-10-30 ENCOUNTER — Encounter: Payer: Self-pay | Admitting: Advanced Practice Midwife

## 2023-11-11 ENCOUNTER — Other Ambulatory Visit: Payer: 59

## 2023-11-16 ENCOUNTER — Encounter: Payer: Self-pay | Admitting: Cardiovascular Disease

## 2023-11-16 ENCOUNTER — Ambulatory Visit: Attending: Cardiovascular Disease | Admitting: Cardiovascular Disease

## 2023-11-16 VITALS — BP 120/70 | HR 57 | Ht 69.0 in | Wt 255.0 lb

## 2023-11-16 DIAGNOSIS — E1159 Type 2 diabetes mellitus with other circulatory complications: Secondary | ICD-10-CM | POA: Diagnosis not present

## 2023-11-16 DIAGNOSIS — E785 Hyperlipidemia, unspecified: Secondary | ICD-10-CM

## 2023-11-16 DIAGNOSIS — I152 Hypertension secondary to endocrine disorders: Secondary | ICD-10-CM

## 2023-11-16 DIAGNOSIS — E1169 Type 2 diabetes mellitus with other specified complication: Secondary | ICD-10-CM

## 2023-11-16 DIAGNOSIS — I252 Old myocardial infarction: Secondary | ICD-10-CM

## 2023-11-16 NOTE — Assessment & Plan Note (Signed)
 History of essential hypertension with blood pressure measured today at 120/70.  He is on amlodipine  metoprolol  and ramipril .

## 2023-11-16 NOTE — Assessment & Plan Note (Signed)
 History of CAD status post non-STEMI in [place]  7/17 with cath that revealed noncritical CAD.  He is active and completely asymptomatic.

## 2023-11-16 NOTE — Assessment & Plan Note (Signed)
 History of hyperlipidemia on statin therapy with lipid profile performed 11/13/2022 revealing total cholesterol 115, LDL 58 and HDL of 38.

## 2023-11-16 NOTE — Patient Instructions (Signed)

## 2023-11-16 NOTE — Progress Notes (Signed)
 11/16/2023 Stanley Cook   [birth year]  [id number]  Primary Physician Chandra Toribio POUR, MD Primary Cardiologist: Dorn JINNY Lesches MD GENI C[place], [place]  HPI:  Stanley Cook is a 63 y.o.   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  His primary care provider was Dr. Claudette Gartner. I last saw him in the office 12/24/2021. He has a history of treated hypertension, and hyperlipidemia.  He also has diabetes and chronic kidney disease with a serum creatinine in the 2 range.  He does not smoke.  He had a non-STEMI in [place]  7/17 and had cath that revealed noncritical CAD.  He did have a 2D echo performed 04/15/2018 that revealed normal LV function.     Since I saw him a year ago he continues to do well.  He is completely asymptomatic.  He did have recent blood work performed by his PCP on 07/09/2022 revealed triglyceride 114, LDL 55 HDL 36.  He has joined [employer] and works out at least twice a week.  His goal is to write a book in  retirement.  Current Meds  Medication Sig   amLODipine  (NORVASC ) 10 MG tablet TAKE 1 TABLET(10 MG) BY MOUTH DAILY   aspirin EC 81 MG tablet Take by mouth.   atorvastatin  (LIPITOR) 40 MG tablet Take 1 tablet (40 mg total) by mouth daily.   cholecalciferol (VITAMIN D3) 25 MCG (1000 UNIT) tablet Take 1,000 Units by mouth daily.   dapagliflozin  propanediol (FARXIGA ) 10 MG TABS tablet Take 1 tablet (10 mg total) by mouth daily.   furosemide  (LASIX ) 40 MG tablet Take 40 mg by mouth. One tablet three times weekly as needed   glucose blood test strip Use to check blood sugars twice daily   Insulin Pen Needle (B-D ULTRAFINE III SHORT PEN) 31G X 8 MM MISC USE DAILY   metoprolol  succinate (TOPROL -XL) 100 MG 24 hr tablet TAKE 1 TABLET(100 MG) BY MOUTH DAILY   ramipril  (ALTACE ) 5 MG capsule Take 5 mg by mouth daily.   sodium bicarbonate 650 MG tablet Take 650 mg by  mouth daily.     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   Social Drivers of Corporate investment banker Strain: Not on file  Food Insecurity: Not on file  Transportation Needs: Not on file  Physical Activity: Not on file  Stress: Not on file  Social Connections: Not on file  Intimate Partner Violence: Not on file     Review of Systems: General: negative for chills, fever, night sweats or weight changes.  Cardiovascular: negative for chest pain, dyspnea on exertion, edema, orthopnea, palpitations, paroxysmal nocturnal dyspnea or shortness of breath Dermatological: negative for rash Respiratory: negative for cough or wheezing Urologic: negative for hematuria Abdominal: negative for nausea, vomiting, diarrhea, bright red blood per rectum, melena, or hematemesis Neurologic: negative for visual changes, syncope, or dizziness All other systems reviewed and are otherwise negative except as noted above.    Blood pressure 120/70, pulse (!) 57, height 5' 9 (1.753 m), weight 255 lb (115.7 kg), SpO2 97%.  General appearance: alert and no distress Neck: no adenopathy, no carotid bruit, no JVD, supple, symmetrical, trachea midline, and thyroid not enlarged, symmetric, no tenderness/mass/nodules Lungs: clear to auscultation  bilaterally Heart: regular rate and rhythm, S1, S2 normal, no murmur, click, rub or gallop Extremities: extremities normal, atraumatic, no cyanosis or edema Pulses: 2+ and symmetric Skin: Skin color, texture, turgor normal. No rashes or lesions Neurologic: Grossly normal  EKG EKG Interpretation Date/Time:  Monday November 16 2023 09:11:35 EDT Ventricular Rate:  57 PR Interval:  178 QRS Duration:  120 QT Interval:  460 QTC Calculation: 447 R Axis:   19  Text Interpretation: Sinus bradycardia Lateral infarct (cited on or before 14-Nov-2022) ST & T wave abnormality, consider anterior ischemia When compared with ECG of 14-Nov-2022 09:58, Premature ventricular complexes are no longer Present Serial changes of evolving Lateral infarct Present Confirmed by Court Carrier [phone]) on 11/16/2023 9:33:36 AM    ASSESSMENT AND PLAN:   Hypertension associated with diabetes (HCC) History of essential hypertension with blood pressure measured today at 120/70.  He is on amlodipine  metoprolol  and ramipril .  History of non-ST elevation myocardial infarction (NSTEMI) History of CAD status post non-STEMI in [place]  7/17 with cath that revealed noncritical CAD.  He is active and completely asymptomatic.  Hyperlipidemia associated with type 2 diabetes mellitus (HCC) History of hyperlipidemia on statin therapy with lipid profile performed 11/13/2022 revealing total cholesterol 115, LDL 58 and HDL of 38.     Carrier DOROTHA Court MD FACP,FACC,FAHA, [facility] 11/16/2023 9:40 AM

## 2023-11-18 ENCOUNTER — Encounter: Payer: 59 | Admitting: Family Medicine

## 2023-11-30 ENCOUNTER — Other Ambulatory Visit: Payer: Self-pay | Admitting: Family Medicine

## 2023-12-08 ENCOUNTER — Encounter: Payer: Self-pay | Admitting: Family Medicine

## 2023-12-08 ENCOUNTER — Ambulatory Visit: Admitting: Family Medicine

## 2023-12-08 VITALS — BP 134/81 | HR 56 | Ht 69.0 in | Wt 254.0 lb

## 2023-12-08 DIAGNOSIS — Z0000 Encounter for general adult medical examination without abnormal findings: Secondary | ICD-10-CM

## 2023-12-08 DIAGNOSIS — Z7984 Long term (current) use of oral hypoglycemic drugs: Secondary | ICD-10-CM

## 2023-12-08 DIAGNOSIS — Z125 Encounter for screening for malignant neoplasm of prostate: Secondary | ICD-10-CM

## 2023-12-08 DIAGNOSIS — N1832 Chronic kidney disease, stage 3b: Secondary | ICD-10-CM

## 2023-12-08 DIAGNOSIS — E1169 Type 2 diabetes mellitus with other specified complication: Secondary | ICD-10-CM | POA: Diagnosis not present

## 2023-12-08 DIAGNOSIS — E1159 Type 2 diabetes mellitus with other circulatory complications: Secondary | ICD-10-CM

## 2023-12-08 DIAGNOSIS — E785 Hyperlipidemia, unspecified: Secondary | ICD-10-CM | POA: Diagnosis not present

## 2023-12-08 DIAGNOSIS — I152 Hypertension secondary to endocrine disorders: Secondary | ICD-10-CM | POA: Diagnosis not present

## 2023-12-08 DIAGNOSIS — E1122 Type 2 diabetes mellitus with diabetic chronic kidney disease: Secondary | ICD-10-CM

## 2023-12-08 LAB — POCT GLYCOSYLATED HEMOGLOBIN (HGB A1C): HbA1c POC (<> result, manual entry): 7.7 % (ref 4.0–5.6)

## 2023-12-08 MED ORDER — TIRZEPATIDE 2.5 MG/0.5ML ~~LOC~~ SOAJ
2.5000 mg | SUBCUTANEOUS | 0 refills | Status: DC
Start: 2023-12-08 — End: 2024-02-08

## 2023-12-08 NOTE — Assessment & Plan Note (Signed)
 Patient presents for follow-up. Last A1c a year ago was 6.8% while on Victoza . Stopped Victoza  in February 2025 due to insurance issues. Today's fingerstick A1c is 7.7%. Patient is not on metformin  due to kidney disorder. Exam significant for intact sensation in feet. Counseled on signs/symptoms of Mounjaro  including nausea, bloating, and constipation, and management with Miralax. Advised to seek care for vomiting. Discussed injection technique and rotating sites weekly. Advised on importance of annual diabetic eye exams. Counseled on foot care, including seeing a podiatrist if issues with self-trimming arise, such as bleeding, to prevent infection. - Start Mounjaro . Will send prescription to [facility] on [address]. - Will start at 2.5 mg weekly for one month, then increase to 5 mg weekly. Will require prior authorization. - Provided education on proper pen injection technique. - Recheck A1c in 3 months. - Urine protein test to be done today. - Continue annual diabetic retinopathy exams.

## 2023-12-08 NOTE — Patient Instructions (Addendum)
 It was nice to see you today,  We addressed the following topics today: -I have sent in a medication called Mounjaro  to take for your diabetes.  This is taken once a week.  Your A1c was 7.7 today.  We would like to be under 7. - If you have any nausea associated with this medication let us  know and I can send in a prescription for a nausea medication.  For constipation that may be associated with this medication it is best to take a medication like MiraLAX which is available over-the-counter.  The goal is to have a bowel movement every 1 to 2 days.  Adjust the MiraLAX as needed - I will get some lab test on you today and let you know the results when I get them.  Have a great day,  Rolan Slain, MD

## 2023-12-08 NOTE — Progress Notes (Unsigned)
 Annual physical  Subjective   Patient ID: Stanley Cook, male    DOB: 27-Jul-1960  Age: 63 y.o. MRN: [id number]  Chief Complaint  Patient presents with   Annual Exam   HPI Stanley Cook is a 63 y.o. old male here  for annual exam.   Subjective - Follow-up for diabetes management. Stopped Victoza  in February 2025 due to insurance/prior authorization issue. Has met with cardiology and nephrology since stopping the medication, neither commented on the cessation. Received pneumonia vaccine at [facility] a few weeks ago.  Medications: Reports stopping Victoza  in February 2025. Takes Farxiga  for diabetes.  PMH, PSH, FH, Social Hx: PMHx: Diabetes, kidney disorder. No family history of thyroid cancer. 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 Reports playing golf. Denies tobacco or alcohol use.  ROS: Constitutional: Denies weight loss. Extremities: Denies numbness or tingling in feet.   The ASCVD Risk score (Arnett DK, et al., 2019) failed to calculate for the following reasons:   Risk score cannot be calculated because patient has a medical history suggesting prior/existing ASCVD  Health Maintenance Due  Topic Date Due   HIV Screening  Never done   Hepatitis C Screening  Never done   Pneumococcal Vaccine: 50+ Years (2 of 2 - PPSV23, PCV20, or PCV21) 01/11/2016   Diabetic kidney evaluation - Urine ACR  07/02/2023   OPHTHALMOLOGY EXAM  07/16/2023   INFLUENZA VACCINE  11/13/2023      Objective:     BP 134/81   Pulse (!) 56   Ht 5' 9 (1.753 m)   Wt 254 lb (115.2 kg)   SpO2 99%   BMI 37.51 kg/m  {Vitals History (Optional):23777}  Physical Exam Gen: alert, oriented CV: RRR. PULM: CTA bilaterally. GI: Abdomen soft, non-tender to palpation. No guarding or rebound. EXTREMITIES: No peripheral edema. Monofilament sensation intact in feet. Toes appear normal, with one toenail noted to be long. No signs of injury or infection.   Results for  orders placed or performed in visit on 12/08/23  POCT glycosylated hemoglobin (Hb A1C)  Result Value Ref Range   Hemoglobin A1C     HbA1c POC (<> result, manual entry) 7.7 4.0 - 5.6 %   HbA1c, POC (prediabetic range)     HbA1c, POC (controlled diabetic range)          Assessment & Plan:   Type 2 diabetes mellitus with stage 3b chronic kidney disease, without long-term current use of insulin (HCC) Assessment & Plan: Patient presents for follow-up. Last A1c a year ago was 6.8% while on Victoza . Stopped Victoza  in February 2025 due to insurance issues. Today's fingerstick A1c is 7.7%. Patient is not on metformin  due to kidney disorder. Exam significant for intact sensation in feet. Counseled on signs/symptoms of Mounjaro  including nausea, bloating, and constipation, and management with Miralax. Advised to seek care for vomiting. Discussed injection technique and rotating sites weekly. Advised on importance of annual diabetic eye exams. Counseled on foot care, including seeing a podiatrist if issues with self-trimming arise, such as bleeding, to prevent infection. - Start Mounjaro . Will send prescription to [facility] on [address]. - Will start at 2.5 mg weekly for one month, then increase to 5 mg weekly. Will require prior authorization. - Provided education on proper pen injection technique. - Recheck A1c in 3 months. - Urine protein test to be done today. - Continue annual diabetic retinopathy exams.  Orders: -     Comprehensive metabolic panel with GFR -  Microalbumin / creatinine urine ratio -     POCT glycosylated hemoglobin (Hb A1C)  Hypertension associated with diabetes (HCC) -     Comprehensive metabolic panel with GFR -     POCT glycosylated hemoglobin (Hb A1C)  Hyperlipidemia associated with type 2 diabetes mellitus (HCC) -     Comprehensive metabolic panel with GFR -     Lipid panel -     POCT glycosylated hemoglobin (Hb A1C)  Screening for prostate cancer -      PSA  Other orders -     Tirzepatide ; Inject 2.5 mg into the skin once a week.  Dispense: 2 mL; Refill: 0     Return in about 3 months (around 03/09/2024) for DM.    Stanley MARLA Slain, MD

## 2023-12-09 ENCOUNTER — Ambulatory Visit: Payer: Self-pay | Admitting: Family Medicine

## 2023-12-09 LAB — COMPREHENSIVE METABOLIC PANEL WITH GFR
ALT: 27 IU/L (ref 0–44)
AST: 24 IU/L (ref 0–40)
Albumin: 4.4 g/dL (ref 3.8–4.9)
Alkaline Phosphatase: 124 IU/L — ABNORMAL HIGH (ref 44–121)
BUN/Creatinine Ratio: 12 (ref 10–24)
BUN: 29 mg/dL — ABNORMAL HIGH (ref 8–27)
Bilirubin Total: 0.6 mg/dL (ref 0.0–1.2)
CO2: 19 mmol/L — ABNORMAL LOW (ref 20–29)
Calcium: 9.4 mg/dL (ref 8.6–10.2)
Chloride: 98 mmol/L (ref 96–106)
Creatinine, Ser: 2.5 mg/dL — ABNORMAL HIGH (ref 0.76–1.27)
Globulin, Total: 2.6 g/dL (ref 1.5–4.5)
Glucose: 154 mg/dL — ABNORMAL HIGH (ref 70–99)
Potassium: 4.6 mmol/L (ref 3.5–5.2)
Sodium: 136 mmol/L (ref 134–144)
Total Protein: 7 g/dL (ref 6.0–8.5)
eGFR: 29 mL/min/1.73 — ABNORMAL LOW (ref >59)

## 2023-12-09 LAB — LIPID PANEL
Chol/HDL Ratio: 3.7 ratio (ref 0.0–5.0)
Cholesterol, Total: 132 mg/dL (ref 100–199)
HDL: 36 mg/dL — ABNORMAL LOW (ref >39)
LDL Chol Calc (NIH): 72 mg/dL (ref 0–99)
Triglycerides: 138 mg/dL (ref 0–149)
VLDL Cholesterol Cal: 24 mg/dL (ref 5–40)

## 2023-12-09 LAB — MICROALBUMIN / CREATININE URINE RATIO
Creatinine, Urine: 143.9 mg/dL
Microalb/Creat Ratio: 159 mg/g{creat} — ABNORMAL HIGH (ref 0–29)
Microalbumin, Urine: 229.2 ug/mL

## 2023-12-09 LAB — PSA: Prostate Specific Ag, Serum: 1.2 ng/mL (ref 0.0–4.0)

## 2023-12-28 ENCOUNTER — Telehealth: Payer: Self-pay

## 2023-12-28 NOTE — Telephone Encounter (Signed)
 Copied from CRM #[id number]. Topic: Clinical - Prescription Issue >> Dec 28, 2023  9:43 AM Dedra B wrote: Reason for CRM: Pt received a letter from his insurance saying that will not cover Mounjaro  without meeting certain criteria. Pls call pt.

## 2024-01-12 ENCOUNTER — Other Ambulatory Visit: Payer: Self-pay | Admitting: Family Medicine

## 2024-01-12 DIAGNOSIS — E1169 Type 2 diabetes mellitus with other specified complication: Secondary | ICD-10-CM

## 2024-01-12 NOTE — Telephone Encounter (Signed)
 Was the mounjaro  prior auth resubmitted?  If not can you resend it with documentation of his A1c's of 7.7 from our 8/26 appointment?  The reason for the first denial was that there was no documentation of his A1c's.

## 2024-01-12 NOTE — Telephone Encounter (Signed)
 Received letter with approval for Mounjaro , contacted pharmacy to see if it would go through and it did, contacted pt and he also received the notification today he said.

## 2024-01-27 ENCOUNTER — Other Ambulatory Visit: Payer: Self-pay | Admitting: Cardiovascular Disease

## 2024-01-29 ENCOUNTER — Other Ambulatory Visit: Payer: Self-pay | Admitting: Family Medicine

## 2024-02-06 ENCOUNTER — Other Ambulatory Visit: Payer: Self-pay | Admitting: Family Medicine

## 2024-03-09 ENCOUNTER — Encounter: Payer: Self-pay | Admitting: Family Medicine

## 2024-03-09 ENCOUNTER — Ambulatory Visit: Admitting: Family Medicine

## 2024-03-09 VITALS — BP 127/75 | HR 64 | Ht 69.0 in | Wt 245.8 lb

## 2024-03-09 DIAGNOSIS — E1159 Type 2 diabetes mellitus with other circulatory complications: Secondary | ICD-10-CM | POA: Diagnosis not present

## 2024-03-09 DIAGNOSIS — Z7984 Long term (current) use of oral hypoglycemic drugs: Secondary | ICD-10-CM

## 2024-03-09 DIAGNOSIS — E785 Hyperlipidemia, unspecified: Secondary | ICD-10-CM

## 2024-03-09 DIAGNOSIS — I152 Hypertension secondary to endocrine disorders: Secondary | ICD-10-CM

## 2024-03-09 DIAGNOSIS — E1169 Type 2 diabetes mellitus with other specified complication: Secondary | ICD-10-CM

## 2024-03-09 LAB — POCT GLYCOSYLATED HEMOGLOBIN (HGB A1C): HbA1c POC (<> result, manual entry): 6.9 % (ref 4.0–5.6)

## 2024-03-09 NOTE — Patient Instructions (Signed)
 It was nice to see you today,  We addressed the following topics today: - If you notice that the effects of Mounjaro , such as decreased hunger, begin to wear off, please let us  know if you would like to consider increasing the dose. - You can call the central scheduling line to transfer your care to the [facility] location if you decide to do so. - If you find a new provider before your next scheduled appointment, you can just cancel it.  Have a great day,  Rolan Slain, MD

## 2024-03-09 NOTE — Assessment & Plan Note (Signed)
 A1C has improved to 6.9 and weight has decreased by approximately 10 lbs on Mounjaro . Patient is tolerating the medication well. Plan is to continue current management. - Continue Mounjaro  5 mg. - Follow up in 5 months.

## 2024-03-09 NOTE — Progress Notes (Signed)
   Established Patient Office Visit  Subjective   Patient ID: Stanley Cook, male    DOB: 15-Jul-1960  Age: 63 y.o. MRN: [id number]  Chief Complaint  Patient presents with   Medical Management of Chronic Issues    HPI  Subjective - Follow-up for diabetes. - [place]. Discussed transferring care to a provider closer to their new address, [place]. Options discussed included [place] at [facility] and [place], The patient decided not to transfer care at this time due to the complexities of moving but will consider it in the future. [place] [facility] was recommended. - Discussed Mounjaro . A prescription was recently sent to [facility] and is reported as delayed. The dose was increased from 2.5 mg to 5 mg. Reports no issues with the 5 mg dose. Expresses satisfaction with current treatment as long as weight loss continues and blood sugar is controlled.  Medications Mounjaro  5 mg, prescription recently sent to [facility] and reported as delayed.  PMH, PSH, FH, Social Hx PMH: Diabetes, sees a nephrologist and cardiologist. Social Hx: Currently in the process of moving.  ROS No new complaints. Denies side effects from Mounjaro .    The ASCVD Risk score (Arnett DK, et al., 2019) failed to calculate for the following reasons:   Risk score cannot be calculated because patient has a medical history suggesting prior/existing ASCVD  Health Maintenance Due  Topic Date Due   HIV Screening  Never done   Hepatitis C Screening  Never done   Pneumococcal Vaccine: 50+ Years (2 of 2 - PPSV23, PCV20, or PCV21) 01/11/2016   OPHTHALMOLOGY EXAM  07/16/2023   COVID-19 Vaccine (5 - 2025-26 season) 12/14/2023      Objective:     BP 127/75   Pulse 64   Ht 5' 9 (1.753 m)   Wt 245 lb 12.8 oz (111.5 kg)   SpO2 98%   BMI 36.30 kg/m    Physical Exam Gen: alert, oriented Pulm: no respiratory distress Psych:  pleasant affect   Results for orders placed or performed in visit on 03/09/24  POCT glycosylated hemoglobin (Hb A1C)  Result Value Ref Range   Hemoglobin A1C     HbA1c POC (<> result, manual entry) 6.9 4.0 - 5.6 %   HbA1c, POC (prediabetic range)     HbA1c, POC (controlled diabetic range)          Assessment & Plan:   Type 2 diabetes mellitus with other specified complication, without long-term current use of insulin (HCC) Assessment & Plan: A1C has improved to 6.9 and weight has decreased by approximately 10 lbs on Mounjaro . Patient is tolerating the medication well. Plan is to continue current management. - Continue Mounjaro  5 mg. - Follow up in 5 months.  Orders: -     POCT glycosylated hemoglobin (Hb A1C)  Hypertension associated with diabetes (HCC) -     POCT glycosylated hemoglobin (Hb A1C)  Hyperlipidemia associated with type 2 diabetes mellitus (HCC) -     POCT glycosylated hemoglobin (Hb A1C)     Return in about 5 months (around 08/07/2024) for DM.    Toribio MARLA Slain, MD

## 2024-04-01 ENCOUNTER — Other Ambulatory Visit: Payer: Self-pay | Admitting: Family Medicine

## 2024-04-01 DIAGNOSIS — E1169 Type 2 diabetes mellitus with other specified complication: Secondary | ICD-10-CM

## 2024-04-25 ENCOUNTER — Other Ambulatory Visit: Payer: Self-pay | Admitting: Family Medicine

## 2024-05-03 ENCOUNTER — Other Ambulatory Visit: Payer: Self-pay | Admitting: Family Medicine

## 2024-08-01 ENCOUNTER — Other Ambulatory Visit

## 2024-08-08 ENCOUNTER — Ambulatory Visit: Admitting: Family Medicine
